# Patient Record
Sex: Female | Born: 1950 | Race: Black or African American | Hispanic: No | Marital: Single | Smoking: Never smoker
Health system: Southern US, Community
[De-identification: ages and names within clinical notes are randomized; demographics above are authoritative.]

## PROBLEM LIST (undated history)

## (undated) DIAGNOSIS — F0390 Unspecified dementia without behavioral disturbance: Secondary | ICD-10-CM

## (undated) DIAGNOSIS — C801 Malignant (primary) neoplasm, unspecified: Secondary | ICD-10-CM

---

## 2020-08-22 ENCOUNTER — Ambulatory Visit: Payer: Self-pay | Attending: Internal Medicine

## 2020-08-22 DIAGNOSIS — Z23 Encounter for immunization: Secondary | ICD-10-CM

## 2020-08-22 NOTE — Progress Notes (Signed)
   Covid-19 Vaccination Clinic  Name:  Audrey Barron    MRN: [id number] DOB: 1951/01/26  08/22/2020  Audrey Barron was observed post Covid-19 immunization for 15 minutes without incident. She was provided with Vaccine Information Sheet and instruction to access the V-Safe system.   Audrey Barron was instructed to call 911 with any severe reactions post vaccine: Marland Kitchen Difficulty breathing  . Swelling of face and throat  . A fast heartbeat  . A bad rash all over body  . Dizziness and weakness   Immunizations Administered    Name Date Dose VIS Date Route   Pfizer COVID-19 Vaccine 08/22/2020 11:38 AM 0.3 mL 02/17/2019 Intramuscular   Manufacturer: [employer], [employer]   Lot: [id number]   [place]: [id number]

## 2020-09-12 ENCOUNTER — Ambulatory Visit: Payer: Self-pay

## 2021-04-17 ENCOUNTER — Other Ambulatory Visit: Payer: Self-pay | Admitting: Physician Assistant

## 2021-04-17 ENCOUNTER — Other Ambulatory Visit: Payer: Self-pay | Admitting: Student

## 2021-04-17 DIAGNOSIS — R413 Other amnesia: Secondary | ICD-10-CM

## 2021-04-26 ENCOUNTER — Other Ambulatory Visit: Payer: Self-pay

## 2021-04-26 ENCOUNTER — Emergency Department: Payer: Medicare HMO

## 2021-04-26 ENCOUNTER — Inpatient Hospital Stay
Admission: EM | Admit: 2021-04-26 | Discharge: 2021-05-02 | DRG: 025 | Disposition: A | Discharge status: Home Health | Payer: Medicare HMO | Attending: Internal Medicine | Admitting: Internal Medicine

## 2021-04-26 DIAGNOSIS — E878 Other disorders of electrolyte and fluid balance, not elsewhere classified: Secondary | ICD-10-CM | POA: Diagnosis present

## 2021-04-26 DIAGNOSIS — C8589 Other specified types of non-Hodgkin lymphoma, extranodal and solid organ sites: Secondary | ICD-10-CM

## 2021-04-26 DIAGNOSIS — E222 Syndrome of inappropriate secretion of antidiuretic hormone: Secondary | ICD-10-CM | POA: Diagnosis present

## 2021-04-26 DIAGNOSIS — F05 Delirium due to known physiological condition: Secondary | ICD-10-CM

## 2021-04-26 DIAGNOSIS — R296 Repeated falls: Secondary | ICD-10-CM | POA: Diagnosis present

## 2021-04-26 DIAGNOSIS — Z79899 Other long term (current) drug therapy: Secondary | ICD-10-CM

## 2021-04-26 DIAGNOSIS — Z8543 Personal history of malignant neoplasm of ovary: Secondary | ICD-10-CM

## 2021-04-26 DIAGNOSIS — I700 Atherosclerosis of aorta: Secondary | ICD-10-CM | POA: Diagnosis present

## 2021-04-26 DIAGNOSIS — G9340 Encephalopathy, unspecified: Secondary | ICD-10-CM | POA: Diagnosis present

## 2021-04-26 DIAGNOSIS — Z4659 Encounter for fitting and adjustment of other gastrointestinal appliance and device: Secondary | ICD-10-CM

## 2021-04-26 DIAGNOSIS — G9389 Other specified disorders of brain: Secondary | ICD-10-CM

## 2021-04-26 DIAGNOSIS — R900 Intracranial space-occupying lesion found on diagnostic imaging of central nervous system: Secondary | ICD-10-CM

## 2021-04-26 DIAGNOSIS — E876 Hypokalemia: Secondary | ICD-10-CM | POA: Diagnosis present

## 2021-04-26 DIAGNOSIS — D496 Neoplasm of unspecified behavior of brain: Principal | ICD-10-CM | POA: Diagnosis present

## 2021-04-26 DIAGNOSIS — W19XXXA Unspecified fall, initial encounter: Secondary | ICD-10-CM | POA: Diagnosis present

## 2021-04-26 DIAGNOSIS — Z20822 Contact with and (suspected) exposure to covid-19: Secondary | ICD-10-CM | POA: Diagnosis present

## 2021-04-26 DIAGNOSIS — R479 Unspecified speech disturbances: Secondary | ICD-10-CM | POA: Diagnosis not present

## 2021-04-26 DIAGNOSIS — G936 Cerebral edema: Secondary | ICD-10-CM | POA: Diagnosis present

## 2021-04-26 DIAGNOSIS — E871 Hypo-osmolality and hyponatremia: Secondary | ICD-10-CM

## 2021-04-26 LAB — CBC
HCT: 41 % (ref 36.0–46.0)
Hemoglobin: 13.7 g/dL (ref 12.0–15.0)
MCH: 28.7 pg (ref 26.0–34.0)
MCHC: 33.4 g/dL (ref 30.0–36.0)
MCV: 85.8 fL (ref 80.0–100.0)
Platelets: 201 10*3/uL (ref 150–400)
RBC: 4.78 MIL/uL (ref 3.87–5.11)
RDW: 12 % (ref 11.5–15.5)
WBC: 7.7 10*3/uL (ref 4.0–10.5)
nRBC: 0 % (ref 0.0–0.2)

## 2021-04-26 LAB — BASIC METABOLIC PANEL
Anion gap: 7 (ref 5–15)
BUN: 9 mg/dL (ref 8–23)
CO2: 27 mmol/L (ref 22–32)
Calcium: 9.2 mg/dL (ref 8.9–10.3)
Chloride: 97 mmol/L — ABNORMAL LOW (ref 98–111)
Creatinine, Ser: 0.62 mg/dL (ref 0.44–1.00)
GFR, Estimated: >60 mL/min (ref >60)
Glucose, Bld: 102 mg/dL — ABNORMAL HIGH (ref 70–99)
Potassium: 4 mmol/L (ref 3.5–5.1)
Sodium: 131 mmol/L — ABNORMAL LOW (ref 135–145)

## 2021-04-26 LAB — RESP PANEL BY RT-PCR (FLU A&B, COVID) ARPGX2
Influenza A by PCR: NEGATIVE
Influenza B by PCR: NEGATIVE
SARS Coronavirus 2 by RT PCR: NEGATIVE

## 2021-04-26 IMAGING — CT CT HEAD W/O CM
3 series · 15 of 47 positions shown, 18 images · non-contrast
Comparison: None.

CLINICAL DATA: Disoriented, multiple falls, facial droop and
weakness from previous stroke

EXAM:
CT HEAD WITHOUT CONTRAST
TECHNIQUE: Contiguous axial images were obtained from the base of the skull
through the vertex without intravenous contrast.

[Series 3: head wo · axial · 0.40mm/px · z∈[-216,-86]mm · 9 of 32 slices shown, 12 images]
[im 3/32  brain]
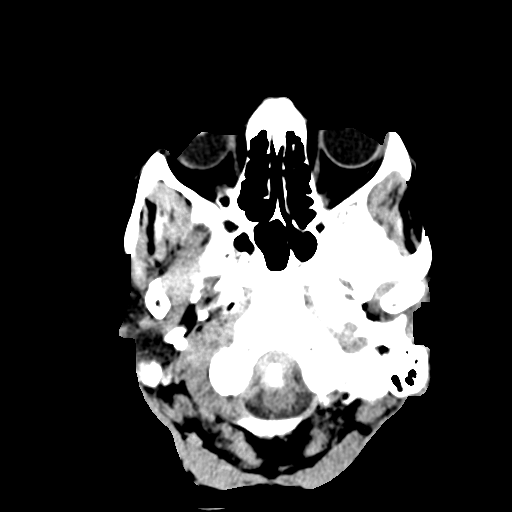
[im 3/32  bone]
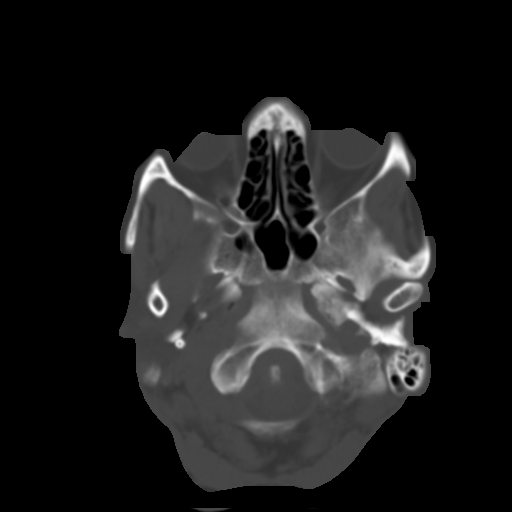
[im 6/32  brain]
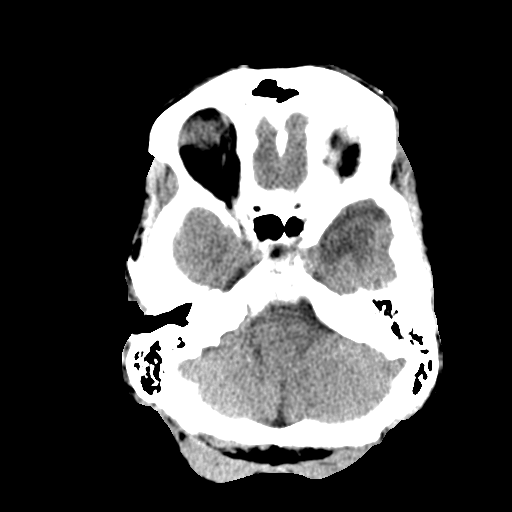
[im 9/32  brain]
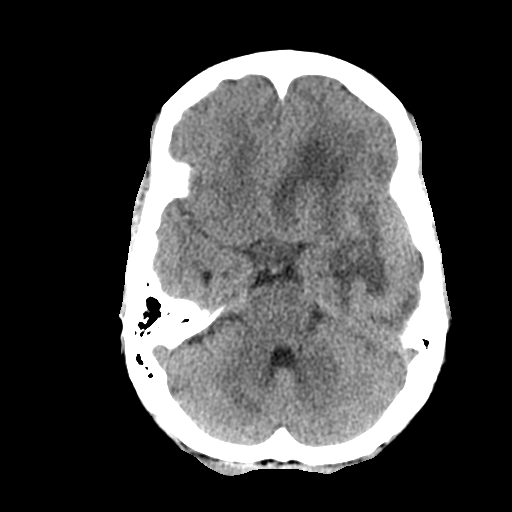
[im 12/32  brain]
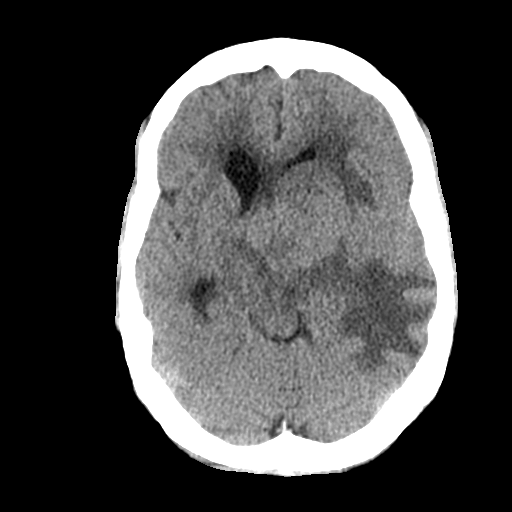
[im 17/32  brain]
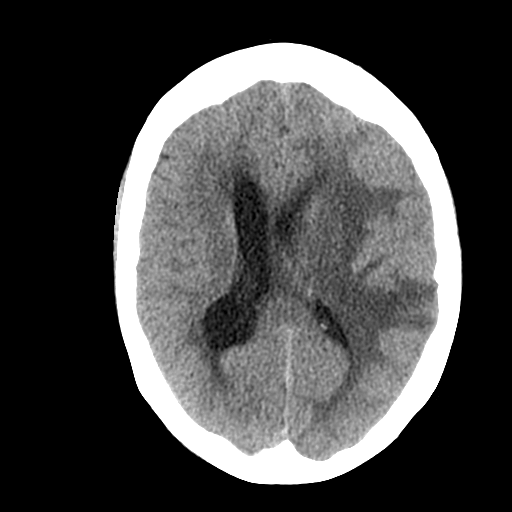
[im 17/32  bone]
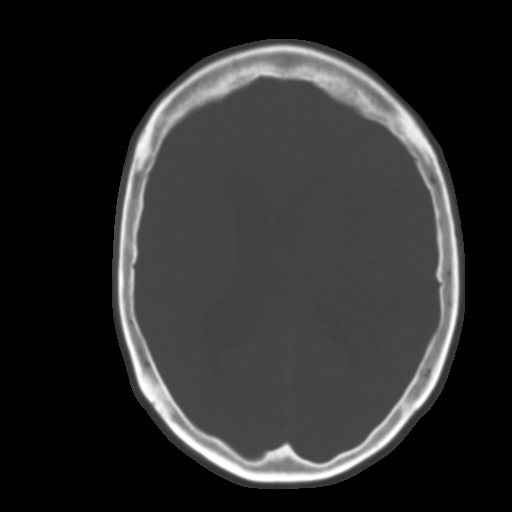
[im 20/32  brain]
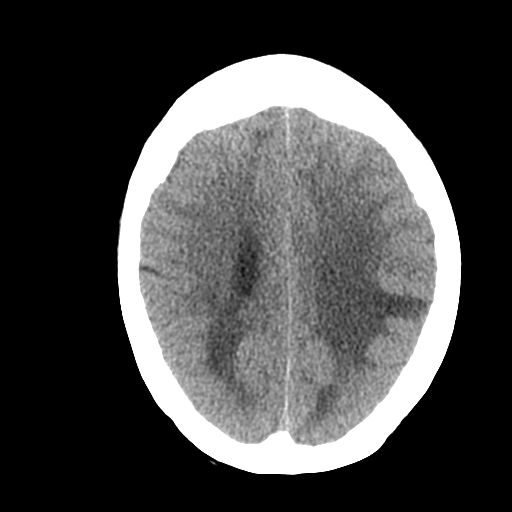
[im 23/32  brain]
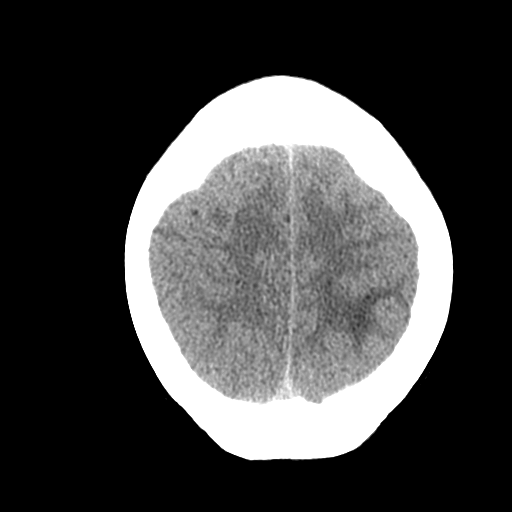
[im 26/32  brain]
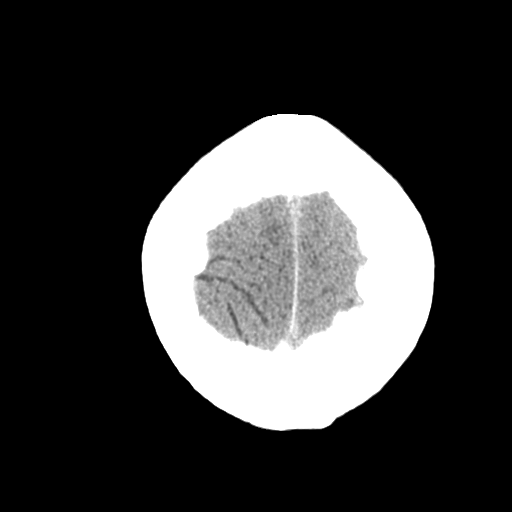
[im 29/32  brain]
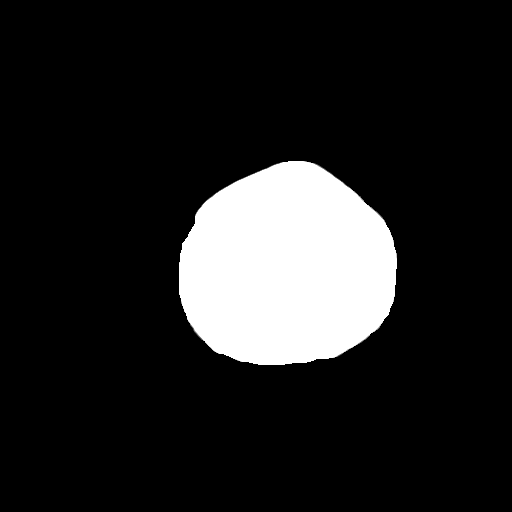
[im 29/32  bone]
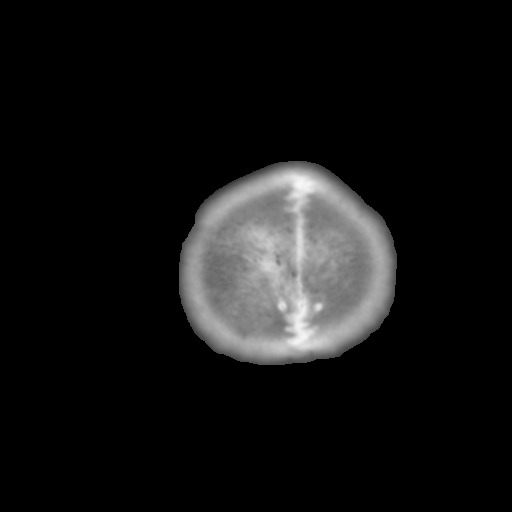

[Series 4: coronal soft tissue · coronal · 0.32mm/px · 3 of 67 slices shown]
[im 23/67  brain]
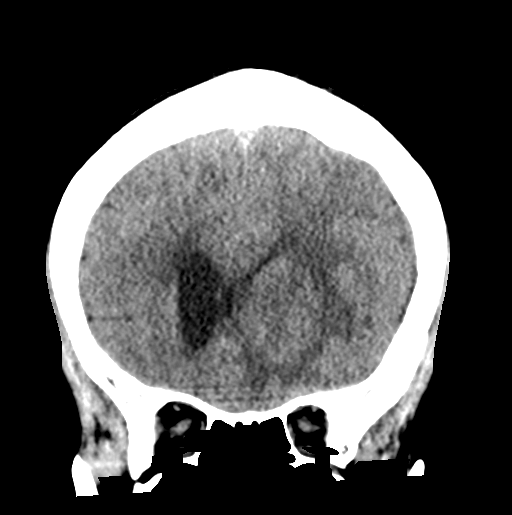
[im 30/67  brain]
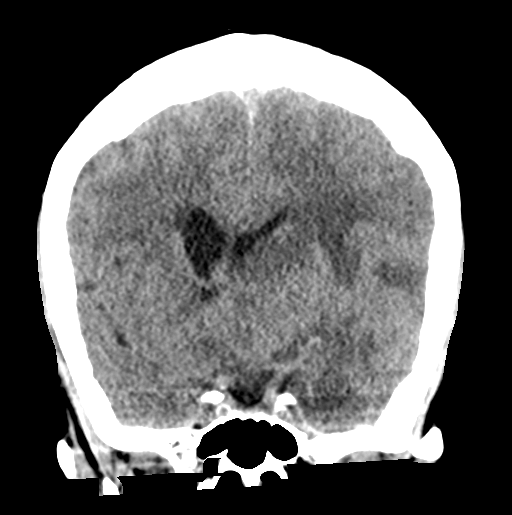
[im 37/67  brain]
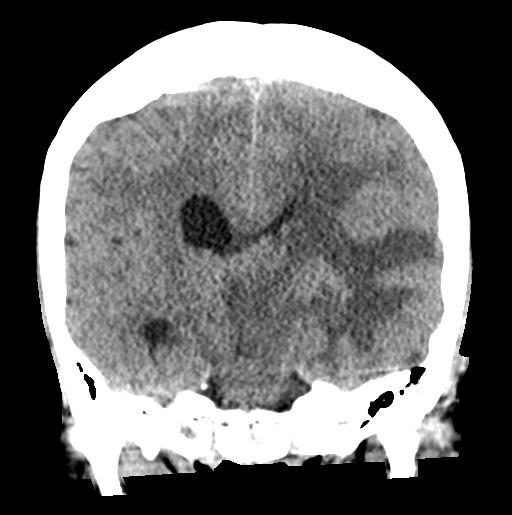

[Series 5: sagittal soft tissue · sagittal · 0.31mm/px · 3 of 54 slices shown]
[im 18/54  brain]
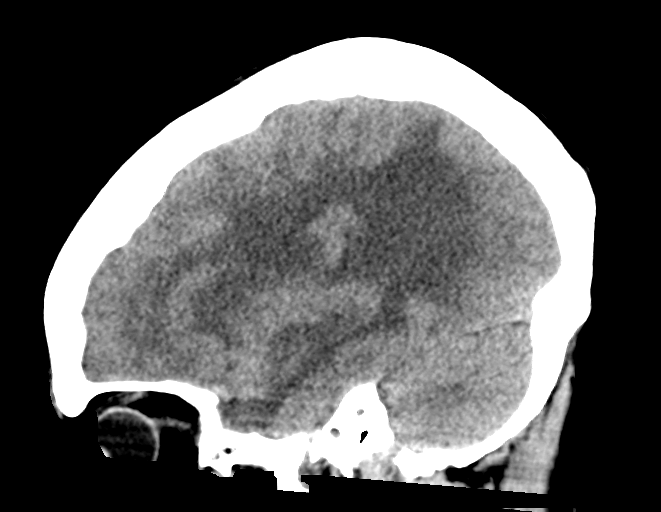
[im 27/54  brain]
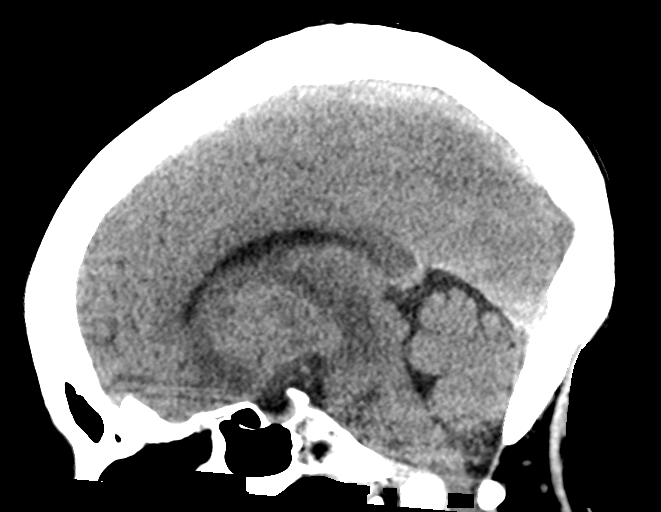
[im 36/54  brain]
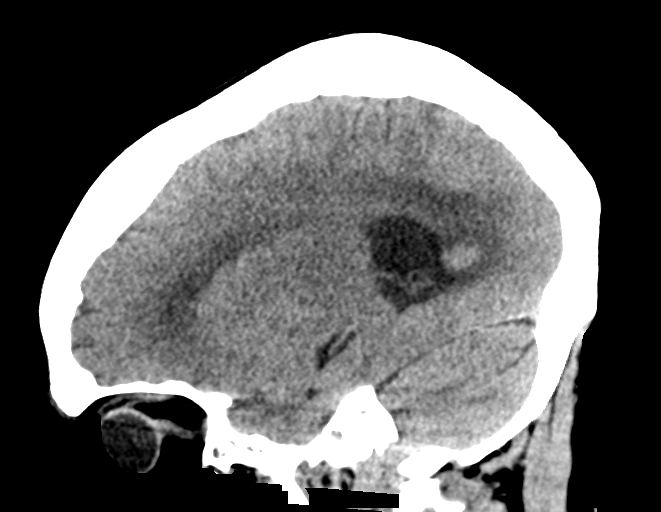

[15 of 47 positions shown; findings below may reference images not displayed]

FINDINGS: Brain: There is a large mass centered in the region of the left
basal ganglia, measuring approximately 4.3 x 3.5 cm. Marked
surrounding vasogenic edema throughout the left cerebral hemisphere,
with marked mass effect and rightward midline shift measuring
approximately 14 mm at the level of the septum pellucidum. There is
effacement the basilar cisterns. MRI is recommended for further
evaluation.

No acute hemorrhage. Marked effacement of the lateral ventricles by
the mass effect described above. No acute extra-axial fluid
collections.

Vascular: No hyperdense vessel or unexpected calcification.

Skull: Normal. Negative for fracture or focal lesion.

Sinuses/Orbits: No acute finding.

Other: None.
IMPRESSION: 1. Large heterogeneous mass centered in the region of the left basal
ganglia, with significant surrounding vasogenic edema and mass
effect. MRI is recommended for further evaluation.
2. No acute infarct or hemorrhage.

Critical Value/emergent results were called by telephone at the time
of interpretation on [DATE] at [DATE] to provider JUMPER
JUMPER , who verbally acknowledged these results.

## 2021-04-26 IMAGING — MR MR HEAD WO/W CM
14 series · 48 of 48 positions shown · IV contrast (5ml Gadavist)
Comparison: Head CT same day

CLINICAL DATA: Brain mass

EXAM:
MRI HEAD WITHOUT AND WITH CONTRAST
TECHNIQUE: Multiplanar, multiecho pulse sequences of the brain and surrounding
structures were obtained without and with intravenous contrast.
CONTRAST:  5mL GADAVIST GADOBUTROL 1 MMOL/ML IV SOLN

[Series 5: ax dwi_tracew · axial · 3.0mm · 0.65mm/px · z∈[-137,+15]mm · 3 of 48 slices shown]
[im 1/48]
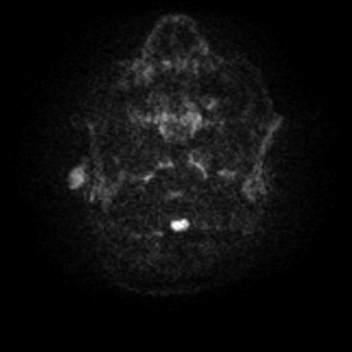
[im 24/48]
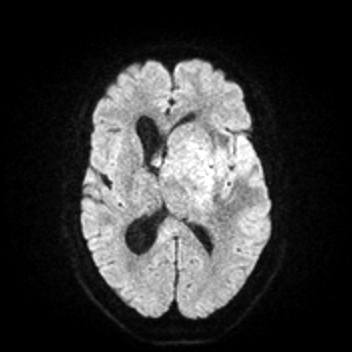
[im 48/48]
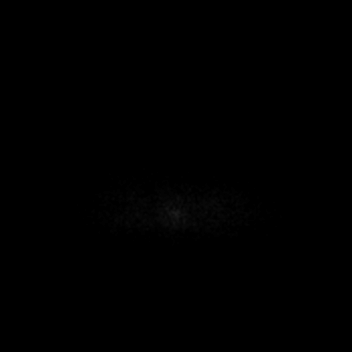

[Series 6: ax dwi_adc · axial · 3.0mm · 0.65mm/px · z∈[-137,+11]mm · 3 of 47 slices shown]
[im 1/47]
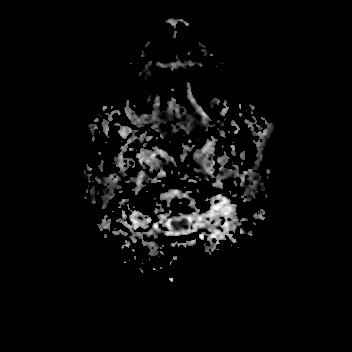
[im 24/47]
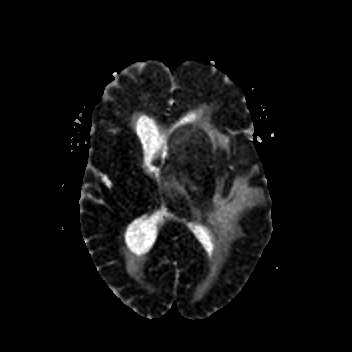
[im 47/47]
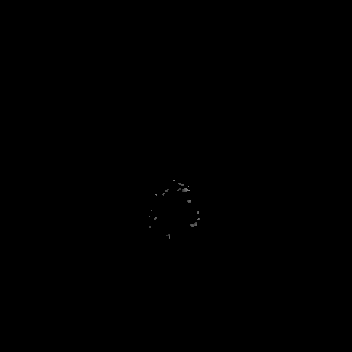

[Series 7: cor dwi_tracew · coronal · 5.0mm · 0.65mm/px · 2 of 40 slices shown]
[im 1/40]
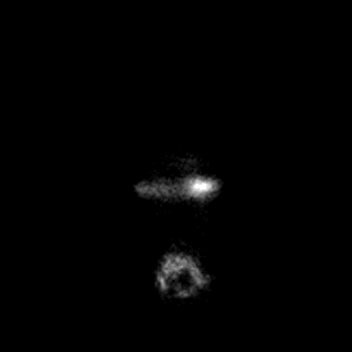
[im 40/40]
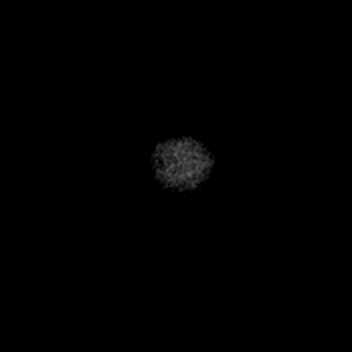

[Series 8: cor dwi_adc · coronal · 5.0mm · 0.65mm/px · 2 of 33 slices shown]
[im 1/33]
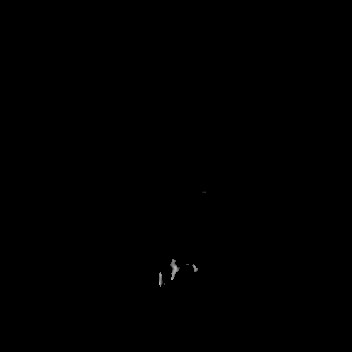
[im 33/33]
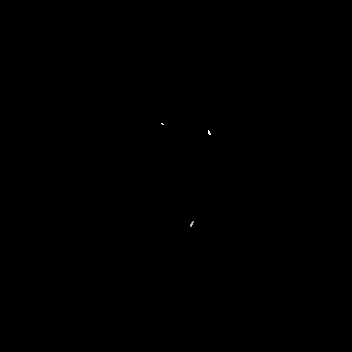

[Series 9: T1 · sagittal · 5.0mm · 0.62mm/px · 1 of 23 slices shown (1 of 2)]
[im 1/23]
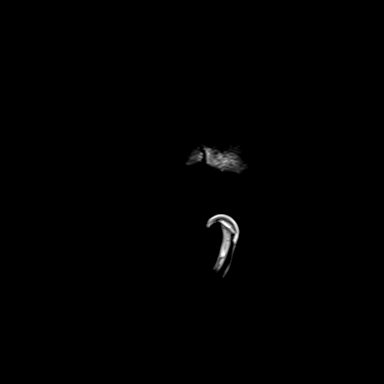

[Series 10: T2 · axial · 5.0mm · 0.53mm/px · z∈[-137,+16]mm · 2 of 27 slices shown (1 of 2)]
[im 1/27]
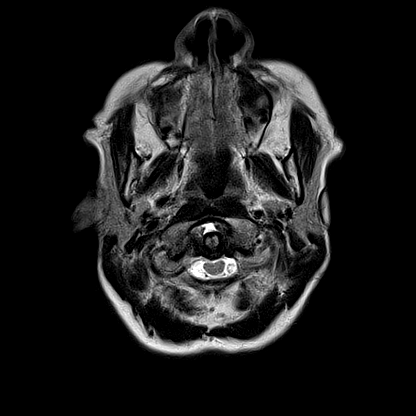
[im 27/27]
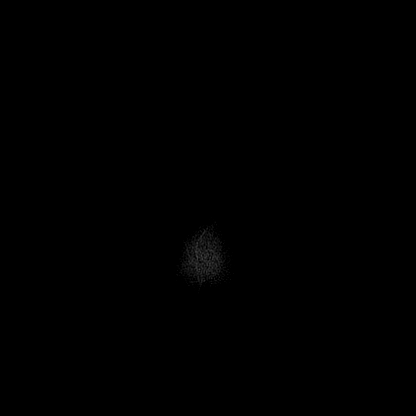

[Series 12: pha_images · axial · 3.0mm · 0.90mm/px · z∈[-145,+23]mm · 3 of 56 slices shown]
[im 1/56]
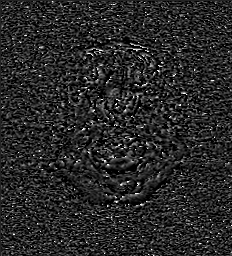
[im 28/56]
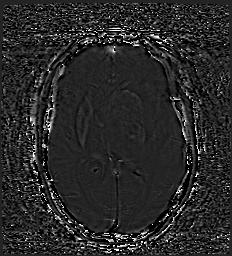
[im 56/56]
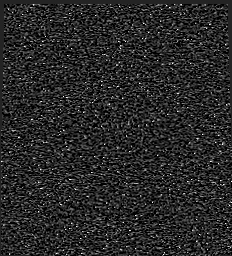

[Series 13: swi_images · axial · 3.0mm · 0.90mm/px · z∈[-148,+26]mm · 4 of 59 slices shown]
[im 1/59]
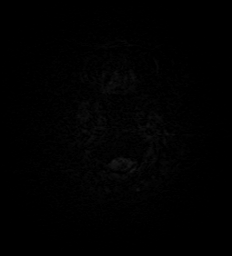
[im 20/59]
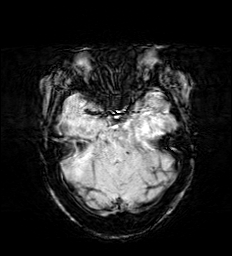
[im 39/59]
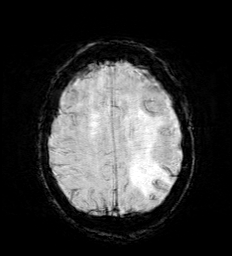
[im 59/59]
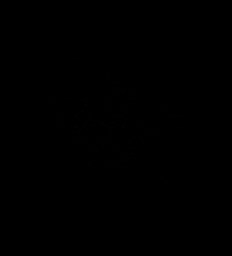

[Series 15: FLAIR · axial · 3.0mm · 0.53mm/px · z∈[-139,+19]mm · 3 of 55 slices shown]
[im 1/55]
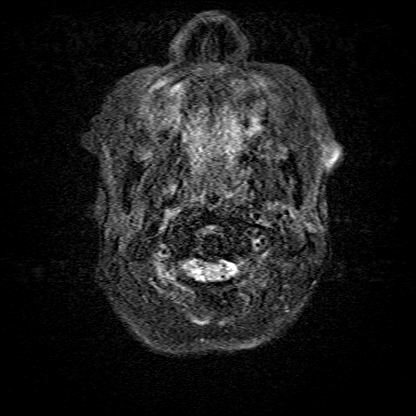
[im 28/55]
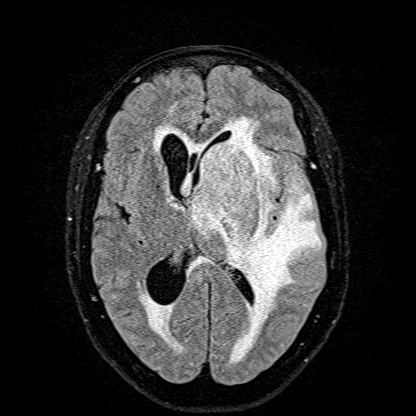
[im 55/55]
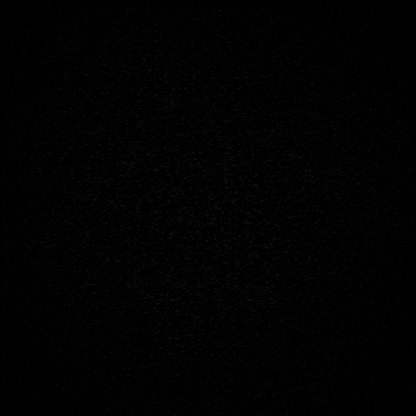

[Series 16: T1 · axial · 1.0mm · 0.98mm/px · z∈[-141,+15]mm · 10 of 160 slices shown (2 of 2)]
[im 1/160]
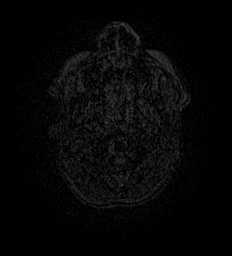
[im 18/160]
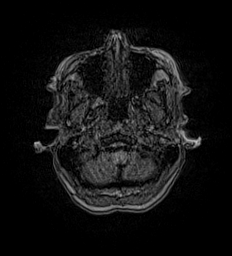
[im 36/160]
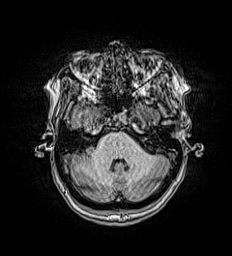
[im 54/160]
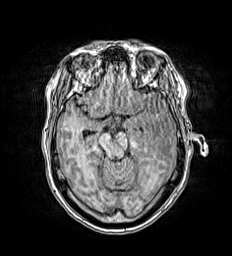
[im 71/160]
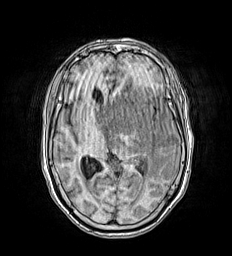
[im 89/160]
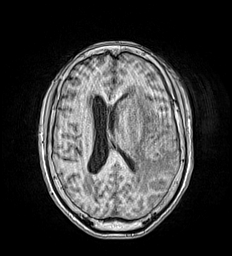
[im 107/160]
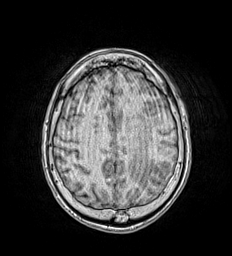
[im 124/160]
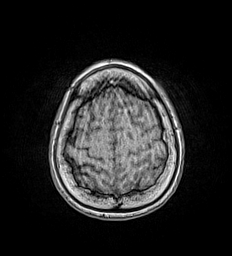
[im 142/160]
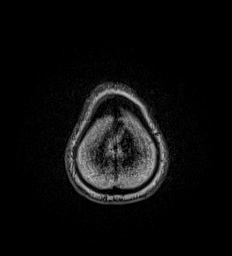
[im 160/160]
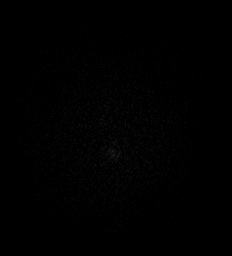

[Series 17: T2 · coronal · 5.0mm · 0.57mm/px · 2 of 29 slices shown (2 of 2)]
[im 1/29]
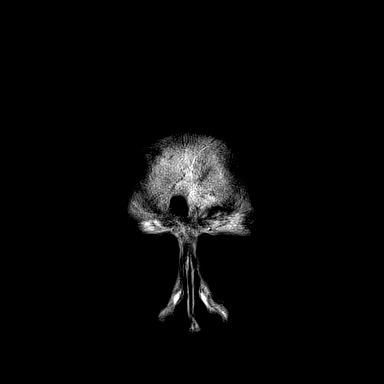
[im 29/29]
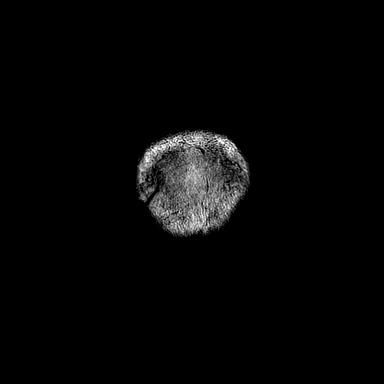

[Series 22: T1 post-contrast · axial · 1.0mm · 0.98mm/px · z∈[-142,+15]mm · 10 of 160 slices shown (1 of 3)]
[im 1/160]
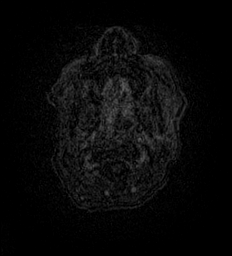
[im 18/160]
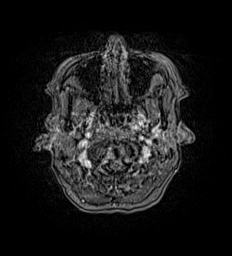
[im 36/160]
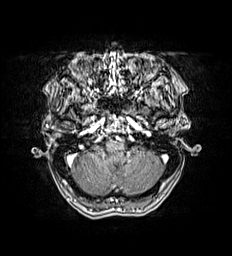
[im 54/160]
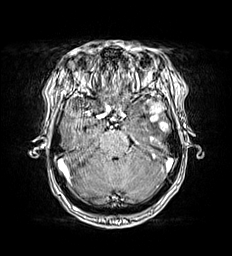
[im 71/160]
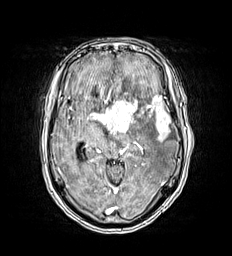
[im 89/160]
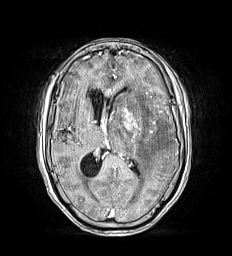
[im 107/160]
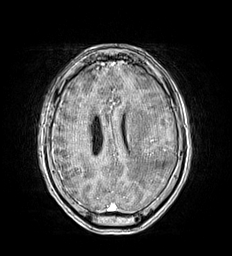
[im 124/160]
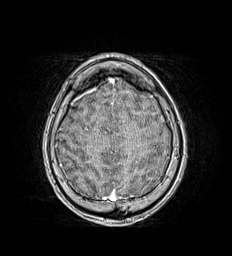
[im 142/160]
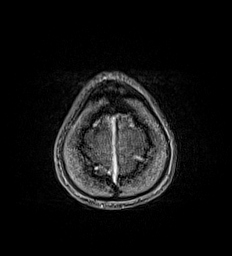
[im 160/160]
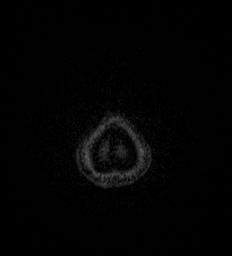

[Series 23: T1 post-contrast · coronal · 5.0mm · 0.57mm/px · 2 of 29 slices shown (2 of 3)]
[im 1/29]
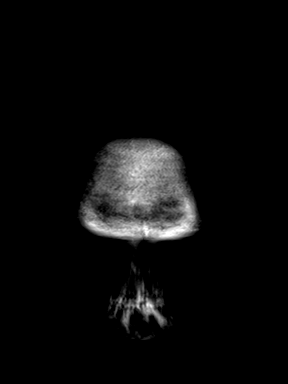
[im 29/29]
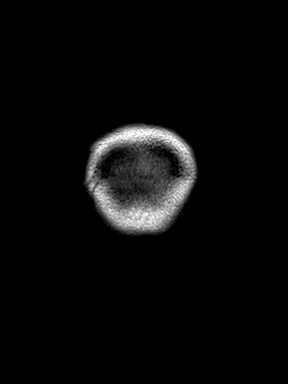

[Series 24: T1 post-contrast · sagittal · 5.0mm · 0.62mm/px · 1 of 23 slices shown (3 of 3)]
[im 1/23]
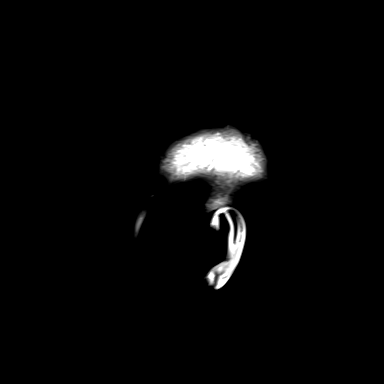

[48 of 48 positions shown; findings below may reference images not displayed]

FINDINGS: Brain: There is multifocal abnormal contrast enhancement centered
within the left basal ganglia and extending into the left temporal
lobe and crossing the midline at the level of the fornix. There is a
large amount of surrounding hyperintense T2-weighted signal. At the
level of the foramina of [place], there is rightward midline shift
measuring 12 mm. The contrast enhancement pattern is predominantly
solid.

Vascular: Major flow voids are preserved.

Skull and upper cervical spine: Normal calvarium and skull base.
Visualized upper cervical spine and soft tissues are normal.

Sinuses/Orbits:No paranasal sinus fluid levels or advanced mucosal
thickening. No mastoid or middle ear effusion. Normal orbits.
IMPRESSION: 1. Multifocal abnormal contrast enhancement centered within the left
basal ganglia and extending into the left temporal lobe, crossing
the midline at the level of the fornix. This is favored to represent
lymphoma. The lack of necrosis is not characteristic of
glioblastoma, which remains the second consideration.
2. Rightward midline shift measuring 12 mm.

## 2021-04-26 MED ORDER — GADOBUTROL 1 MMOL/ML IV SOLN
5.0000 mL | Freq: Once | INTRAVENOUS | Status: AC | PRN
  Administered 2021-04-27: 5 mL via INTRAVENOUS

## 2021-04-26 NOTE — ED Provider Notes (Signed)
[facility] Emergency Department Provider Note  Time seen: 10:04 PM  I have reviewed the triage vital signs and the nursing notes.   HISTORY  Chief Complaint Fall   HPI Audrey Barron is a 70 y.o. female presents to the emergency department for falls, feeling off balance and difficulty speaking.  According to the daughter the patient moved in with her yesterday, but she states since January the patient has been having occasional falls and had difficulty speaking.  States the patient's boyfriend told her that he thinks she had a stroke but did not follow-up with any medical professional.  Daughter states the patient had improved and was doing well until approximately 1 week ago when the patient began having significant speech difficulties once again as well as frequent falls.  Patient had a fall today so the daughter brought her to the emergency department for evaluation.  Here the patient is awake and alert but she has significant difficulty getting words out, appears to have a right facial droop as well as right arm weakness on exam.  No past medical history on file.  There are no problems to display for this patient.   Prior to Admission medications   Not on File    Not on File  No family history on file.  Social History    Review of Systems Per patient and daughter Constitutional: Negative for fever. Cardiovascular: Negative for chest pain. Respiratory: Negative for shortness of breath. Gastrointestinal: Negative for abdominal pain Musculoskeletal: Negative for musculoskeletal complaints Neurological: Negative for headache.  Right facial droop.  Right arm weakness.  Difficulty with speech. All other ROS negative  ____________________________________________   PHYSICAL EXAM:  VITAL SIGNS: ED Triage Vitals  Enc Vitals Group     BP 04/26/21 1748 129/89     Pulse Rate 04/26/21 1748 71     Resp 04/26/21 1748 20     Temp 04/26/21 1748 99.5 F  (37.5 C)     Temp Source 04/26/21 1748 Oral     SpO2 04/26/21 1748 98 %     Weight 04/26/21 1752 130 lb (59 kg)     Height 04/26/21 1752 5\' 5"  (1.651 m)     Head Circumference --      Peak Flow --      Pain Score --      Pain Loc --      Pain Edu? --      Excl. in [place]? --    Constitutional: Alert and oriented. Well appearing and in no distress. Eyes: Normal exam ENT      Head: Normocephalic and atraumatic.      Mouth/Throat: Mucous membranes are moist. Cardiovascular: Normal rate, regular rhythm.  Respiratory: Normal respiratory effort without tachypnea nor retractions. Breath sounds are clear  Gastrointestinal: Soft and nontender. No distention.   Musculoskeletal: Nontender with normal range of motion in all extremities.  Neurologic: Patient has fairly significant speech difficulties.  Unable to articulate the words she is trying to say.  Patient also has a significant right facial droop as well and has a right pronator drift on exam. Skin:  Skin is warm, dry and intact.  Psychiatric: Mood and affect are normal.  ____________________________________________    EKG  EKG viewed and interpreted by myself shows normal sinus rhythm at 71 bpm with a narrow QRS, normal axis, normal intervals, no concerning ST changes.  ____________________________________________    RADIOLOGY  CT scan shows a large mass with vasogenic edema mass-effect and midline  shift.  ____________________________________________   INITIAL IMPRESSION / ASSESSMENT AND PLAN / ED COURSE  Pertinent labs & imaging results that were available during my care of the patient were reviewed by me and considered in my medical decision making (see chart for details).   Patient presents emergency department for difficulty speaking as well as increased falls recently.  Daughter states this has been going on since January but acutely worse over the past 1 week.  On examination patient has a significant right facial droop  she has a right pronator drift with difficulty with extension as well as significant difficulty with word finding as well as articulation.  Suspect likely CVA versus mass.  We will obtain CT imaging.  Patient's lab work is largely within normal limits, urinalysis pending.  CT unfortunately consistent with a large brain mass.  Spoke to Dr. Cari Caraway we will obtain an MRI with and without contrast to further evaluate.  I did update the patient as well as daughter regarding the findings and plan for further work-up.  Blood work largely [place].  COVID-negative.  Patient care signed out to oncoming provider.  MRI pending.   NIH Stroke Scale   Interval: Baseline Time: 10:07 PM Person Administering Scale: Harvest Dark  Administer stroke scale items in the order listed. Record performance in each category after each subscale exam. Do not go back and change scores. Follow directions provided for each exam technique. Scores should reflect what the patient does, not what the clinician thinks the patient can do. The clinician should record answers while administering the exam and work quickly. Except where indicated, the patient should not be coached (i.e., repeated requests to patient to make a special effort).   1a  Level of consciousness: 0=alert; keenly responsive  1b. LOC questions:  0=Performs both tasks correctly  1c. LOC commands: 0=Performs both tasks correctly  2.  Best Gaze: 0=normal  3.  Visual: 0=No visual loss  4. Facial Palsy: 2=Partial paralysis (total or near total paralysis of the lower face)  5a.  Motor left arm: 0=No drift, limb holds 90 (or 45) degrees for full 10 seconds  5b.  Motor right arm: 1=Drift, limb holds 90 (or 45) degrees but drifts down before full 10 seconds: does not hit bed  6a. motor left leg: 0=No drift, limb holds 90 (or 45) degrees for full 10 seconds  6b  Motor right leg:  0=No drift, limb holds 90 (or 45) degrees for full 10 seconds  7. Limb  Ataxia: 1=Present in one limb  8.  Sensory: 0=Normal; no sensory loss  9. Best Language:  1=Mild to moderate aphasia; some obvious loss of fluency or facility of comprehension without significant limitation on ideas expressed or form of expression.  10. Dysarthria: 1=Mild to moderate, patient slurs at least some words and at worst, can be understood with some difficulty  11. Extinction and Inattention: 0=No abnormality  12. Distal motor function: 1=At least some extension after 5 seconds, but is not fully extended. Any movement of the fingers which is not in response to a command is not scored   Total:   7    Audrey Barron was evaluated in Emergency Department on 04/26/2021 for the symptoms described in the history of present illness. She was evaluated in the context of the global COVID-19 pandemic, which necessitated consideration that the patient might be at risk for infection with the SARS-CoV-2 virus that causes COVID-19. Institutional protocols and algorithms that pertain to the evaluation of patients at  risk for COVID-19 are in a state of rapid change based on information released by regulatory bodies including the CDC and federal and state organizations. These policies and algorithms were followed during the patient's care in the ED.  ____________________________________________   FINAL CLINICAL IMPRESSION(S) / ED DIAGNOSES  Brain mass   Harvest Dark, MD 04/26/21 2323

## 2021-04-26 NOTE — ED Triage Notes (Signed)
Pt to ED [facility] from home, lives with family. Ems reports has been having more recent falls. Pt disoriented, this is normal per ems. Hx stroke, per ems pt has facial droop and weakness from last stroke.  Pt denies complaints at this time. No obvious deformities, bruises or lacerations from recent falls.  Difficult to assess pt, when asked pt to smile and lift arms, pt asked RN "what are you smoking", pt appears to have full range of motion in all extremities, intermittently not following commands, staring at [employer]

## 2021-04-26 NOTE — ED Notes (Signed)
Patient to MRI.

## 2021-04-26 NOTE — ED Notes (Signed)
Pt to ct scan.

## 2021-04-26 NOTE — ED Notes (Signed)
Family reports recent falls.  Pt was found outside today in the yard with no pants on.  Pt c/o right hip pain earlier today.  Denies pain now.  Hx dementia.  No rotation or shortening noted.  Denies h/a  Pt alert.

## 2021-04-27 ENCOUNTER — Encounter: Payer: Self-pay | Admitting: Family Medicine

## 2021-04-27 ENCOUNTER — Ambulatory Visit: Payer: Medicare HMO

## 2021-04-27 ENCOUNTER — Other Ambulatory Visit: Payer: Self-pay

## 2021-04-27 ENCOUNTER — Inpatient Hospital Stay: Payer: Medicare HMO

## 2021-04-27 DIAGNOSIS — W19XXXA Unspecified fall, initial encounter: Secondary | ICD-10-CM

## 2021-04-27 DIAGNOSIS — R900 Intracranial space-occupying lesion found on diagnostic imaging of central nervous system: Secondary | ICD-10-CM | POA: Diagnosis present

## 2021-04-27 DIAGNOSIS — G936 Cerebral edema: Secondary | ICD-10-CM | POA: Diagnosis present

## 2021-04-27 DIAGNOSIS — E876 Hypokalemia: Secondary | ICD-10-CM | POA: Diagnosis present

## 2021-04-27 DIAGNOSIS — C8589 Other specified types of non-Hodgkin lymphoma, extranodal and solid organ sites: Secondary | ICD-10-CM

## 2021-04-27 DIAGNOSIS — Z8543 Personal history of malignant neoplasm of ovary: Secondary | ICD-10-CM | POA: Diagnosis not present

## 2021-04-27 DIAGNOSIS — E871 Hypo-osmolality and hyponatremia: Secondary | ICD-10-CM

## 2021-04-27 DIAGNOSIS — I700 Atherosclerosis of aorta: Secondary | ICD-10-CM | POA: Diagnosis present

## 2021-04-27 DIAGNOSIS — D496 Neoplasm of unspecified behavior of brain: Secondary | ICD-10-CM | POA: Diagnosis present

## 2021-04-27 DIAGNOSIS — G9389 Other specified disorders of brain: Principal | ICD-10-CM

## 2021-04-27 DIAGNOSIS — R410 Disorientation, unspecified: Secondary | ICD-10-CM

## 2021-04-27 DIAGNOSIS — R2681 Unsteadiness on feet: Secondary | ICD-10-CM

## 2021-04-27 DIAGNOSIS — R479 Unspecified speech disturbances: Secondary | ICD-10-CM | POA: Diagnosis present

## 2021-04-27 DIAGNOSIS — F05 Delirium due to known physiological condition: Secondary | ICD-10-CM | POA: Diagnosis present

## 2021-04-27 DIAGNOSIS — E878 Other disorders of electrolyte and fluid balance, not elsewhere classified: Secondary | ICD-10-CM | POA: Diagnosis present

## 2021-04-27 DIAGNOSIS — Z20822 Contact with and (suspected) exposure to covid-19: Secondary | ICD-10-CM | POA: Diagnosis present

## 2021-04-27 DIAGNOSIS — G9340 Encephalopathy, unspecified: Secondary | ICD-10-CM | POA: Diagnosis present

## 2021-04-27 DIAGNOSIS — E222 Syndrome of inappropriate secretion of antidiuretic hormone: Secondary | ICD-10-CM | POA: Diagnosis present

## 2021-04-27 DIAGNOSIS — R296 Repeated falls: Secondary | ICD-10-CM | POA: Diagnosis present

## 2021-04-27 DIAGNOSIS — Z79899 Other long term (current) drug therapy: Secondary | ICD-10-CM | POA: Diagnosis not present

## 2021-04-27 LAB — CBC
HCT: 40 % (ref 36.0–46.0)
Hemoglobin: 13.7 g/dL (ref 12.0–15.0)
MCH: 29 pg (ref 26.0–34.0)
MCHC: 34.3 g/dL (ref 30.0–36.0)
MCV: 84.6 fL (ref 80.0–100.0)
Platelets: 194 10*3/uL (ref 150–400)
RBC: 4.73 MIL/uL (ref 3.87–5.11)
RDW: 12 % (ref 11.5–15.5)
WBC: 7.3 10*3/uL (ref 4.0–10.5)
nRBC: 0 % (ref 0.0–0.2)

## 2021-04-27 LAB — BASIC METABOLIC PANEL
Anion gap: 7 (ref 5–15)
BUN: 7 mg/dL — ABNORMAL LOW (ref 8–23)
CO2: 27 mmol/L (ref 22–32)
Calcium: 9.3 mg/dL (ref 8.9–10.3)
Chloride: 97 mmol/L — ABNORMAL LOW (ref 98–111)
Creatinine, Ser: 0.53 mg/dL (ref 0.44–1.00)
GFR, Estimated: >60 mL/min (ref >60)
Glucose, Bld: 90 mg/dL (ref 70–99)
Potassium: 3.9 mmol/L (ref 3.5–5.1)
Sodium: 131 mmol/L — ABNORMAL LOW (ref 135–145)

## 2021-04-27 LAB — URINALYSIS, COMPLETE (UACMP) WITH MICROSCOPIC
Bacteria, UA: NONE SEEN
Bilirubin Urine: NEGATIVE
Glucose, UA: NEGATIVE mg/dL
Hgb urine dipstick: NEGATIVE
Ketones, ur: 20 mg/dL — AB
Leukocytes,Ua: NEGATIVE
Nitrite: NEGATIVE
Protein, ur: NEGATIVE mg/dL
Specific Gravity, Urine: 1.021 (ref 1.005–1.030)
pH: 9 — ABNORMAL HIGH (ref 5.0–8.0)

## 2021-04-27 LAB — VITAMIN B12: Vitamin B-12: 958 pg/mL — ABNORMAL HIGH (ref 180–914)

## 2021-04-27 LAB — TSH: TSH: 0.411 u[IU]/mL (ref 0.350–4.500)

## 2021-04-27 LAB — PLATELET FUNCTION ASSAY: Collagen / Epinephrine: 131 seconds (ref 0–193)

## 2021-04-27 LAB — HIV ANTIBODY (ROUTINE TESTING W REFLEX): HIV Screen 4th Generation wRfx: NONREACTIVE

## 2021-04-27 IMAGING — CT CT CHEST-ABD-PELV W/ CM
2 of 5 series · 13 of 36 positions shown, 15 images · IV contrast (omnipaque)
Comparison: Only brain imaging is available for comparison.

CLINICAL DATA: Metastatic disease evaluation, history of possible
lymphoma in a 69-year-old female found to have abnormality on brain
imaging.

EXAM:
CT CHEST, ABDOMEN, AND PELVIS WITH CONTRAST
TECHNIQUE: Multidetector CT imaging of the chest, abdomen and pelvis was
performed following the standard protocol during bolus
administration of intravenous contrast.
CONTRAST:  75mL OMNIPAQUE IOHEXOL 300 MG/ML  SOLN

[Series 2: cap with · axial · 0.77mm/px · z∈[-982,-487]mm · 10 of 123 slices shown, 12 images]
[im 12/123  mediastinal]
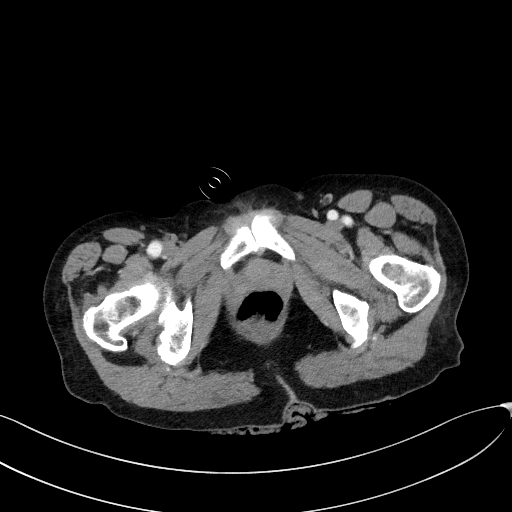
[im 12/123  bone]
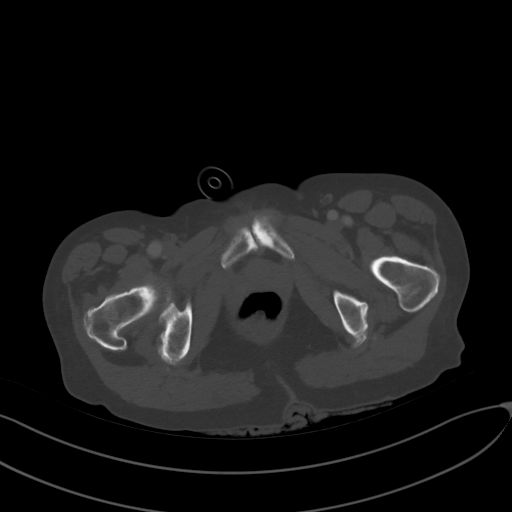
[im 23/123  mediastinal]
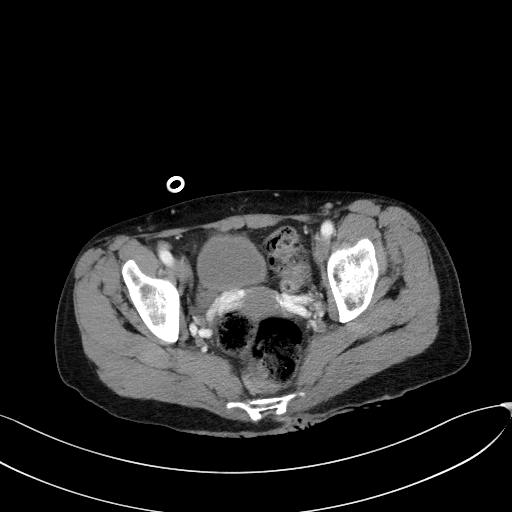
[im 34/123  mediastinal]
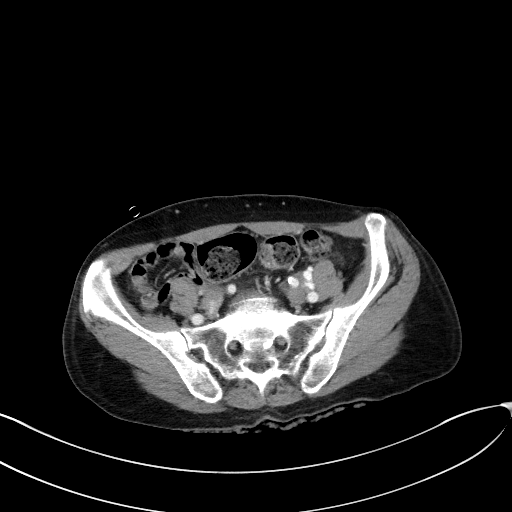
[im 45/123  mediastinal]
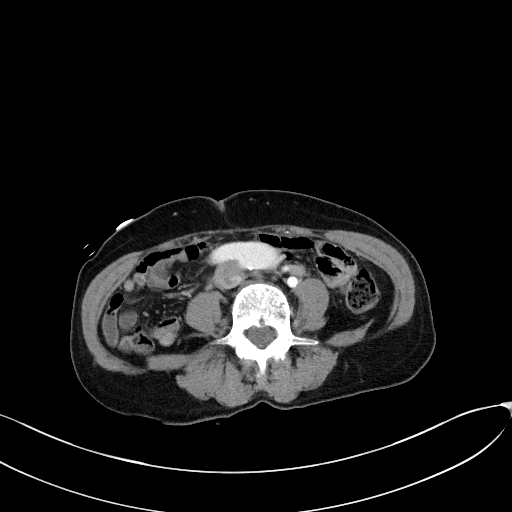
[im 56/123  mediastinal]
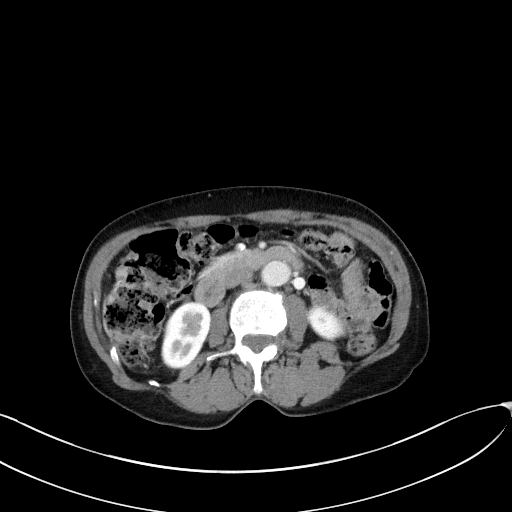
[im 67/123  mediastinal]
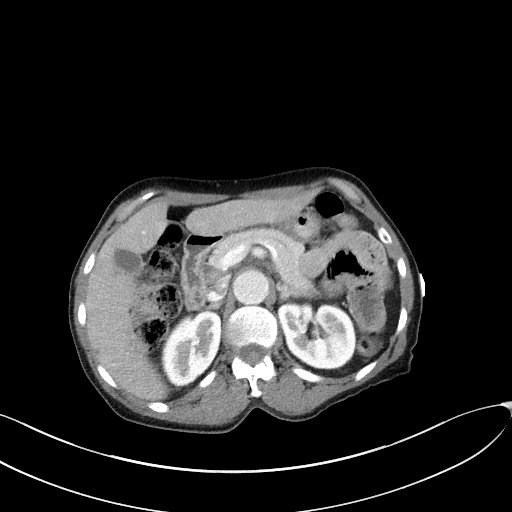
[im 78/123  mediastinal]
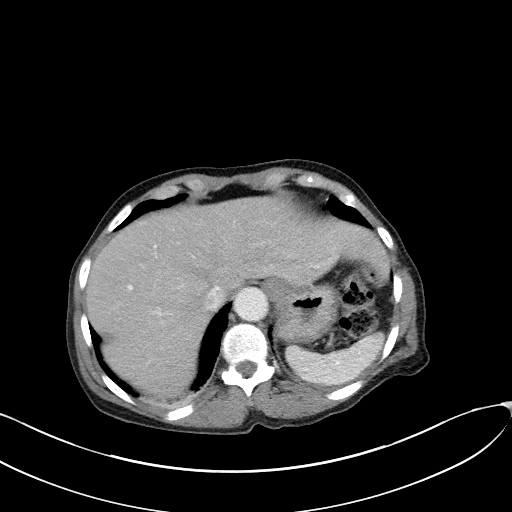
[im 89/123  mediastinal]
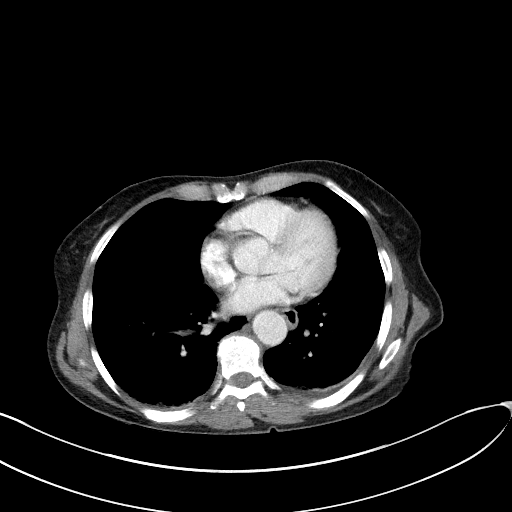
[im 100/123  mediastinal]
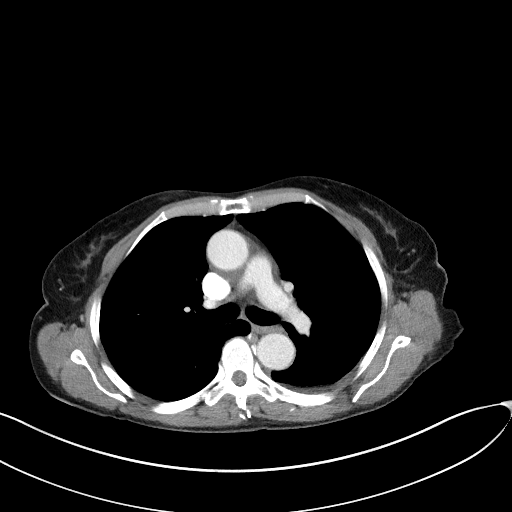
[im 100/123  bone]
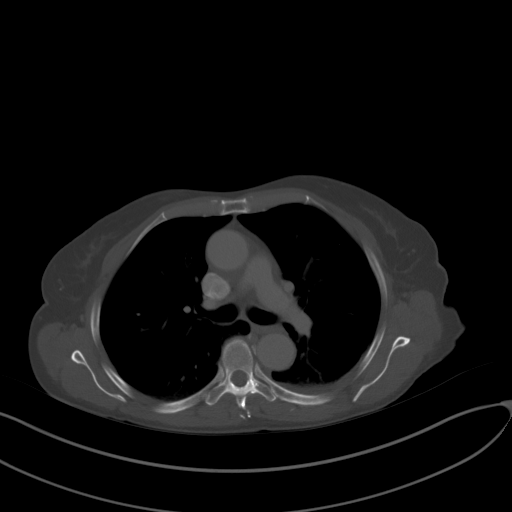
[im 111/123  mediastinal]
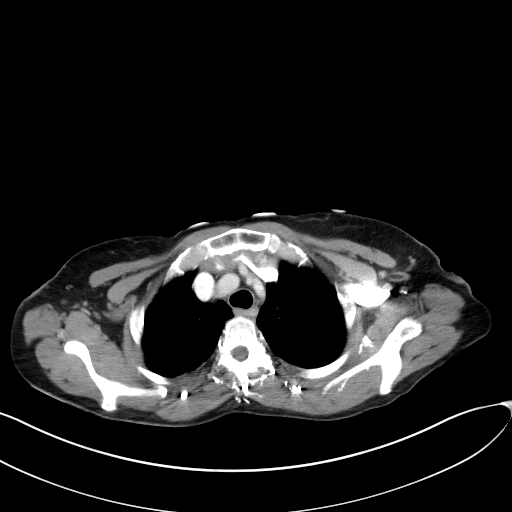

[Series 5: coronals · coronal · 0.68mm/px · 3 of 110 slices shown]
[im 22/110  mediastinal]
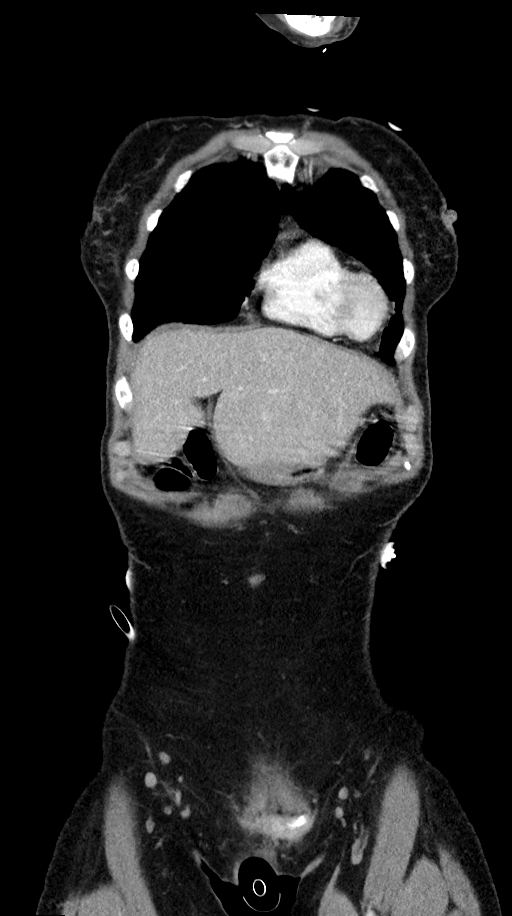
[im 44/110  mediastinal]
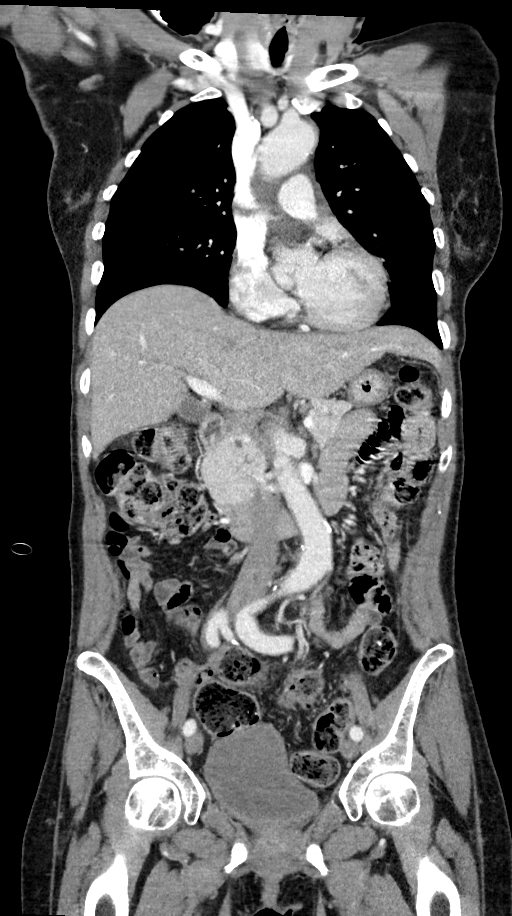
[im 66/110  mediastinal]
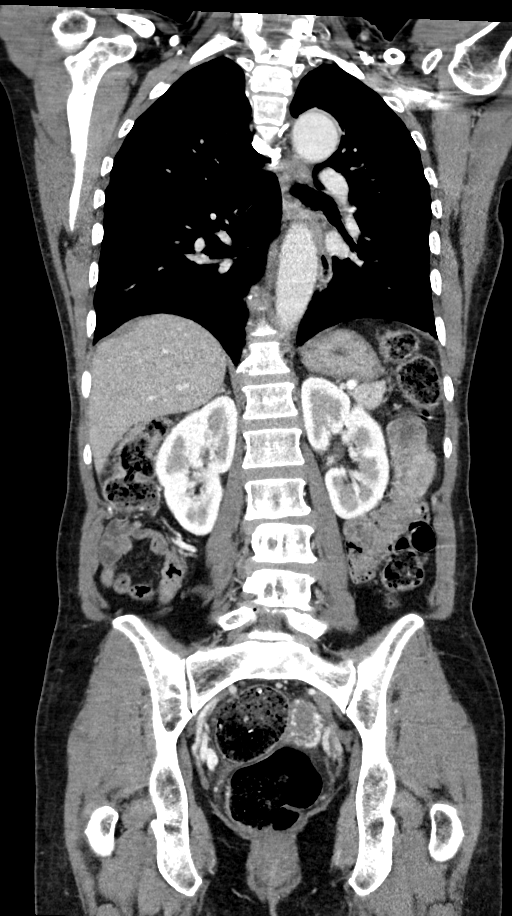

[13 of 36 positions shown; findings below may reference images not displayed]

FINDINGS: CT CHEST FINDINGS

Cardiovascular: Scattered aortic atherosclerosis. No aneurysmal
dilation of the thoracic aorta. Heart size normal without
pericardial effusion. Central pulmonary vasculature is normal
caliber.

Mediastinum/Nodes: Esophagus mildly patulous. No thoracic inlet
lymphadenopathy.

No axillary lymphadenopathy.

No hilar or mediastinal lymphadenopathy.

Lungs/Pleura: No consolidation or pleural effusion. Atelectasis at
the lung bases. Airways are patent. No suspicious mass or nodule.

Musculoskeletal: See below for full musculoskeletal details.

CT ABDOMEN PELVIS FINDINGS

Hepatobiliary: Low-density lesions in the liver, for instance on
image 52 of series 2, well-circumscribed area in the RIGHT hepatic
lobe measuring approximately 11 mm and another subcentimeter area in
the anterior LEFT hepatic lobe, an additional 1.7 cm lesion in the
posterior RIGHT hepatic lobe clearly with water density and
well-circumscribed margins. These are compatible with cysts. No
pericholecystic stranding. The portal vein is patent. No biliary
duct dilation.

Pancreas: Normal, without mass, inflammation or ductal dilatation.

Spleen: Normal spleen.

Adrenals/Urinary Tract: Adrenal glands are normal.

Symmetric renal enhancement. No suspicious renal lesion. No
hydronephrosis. The urinary bladder is under distended limiting
assessment.

Stomach/Bowel:

Mild gastric thickening versus under distension.

Small bowel is normal caliber without signs of adjacent inflammation
or visible lesion, limited assessment due to lack of contrast.

No signs of colonic obstruction or acute colonic inflammation. No
pericecal stranding. Appendix is normal.

Vascular/Lymphatic: Tortuous nonaneurysmal abdominal aorta with mild
atherosclerotic plaque. Mildly dilated LEFT common iliac artery up
to 1.5 cm also tortuous.

Smooth contour of the IVC.

There is no gastrohepatic or hepatoduodenal ligament
lymphadenopathy. No retroperitoneal or mesenteric lymphadenopathy.
Engorgement of LEFT ovarian vein.

No pelvic sidewall lymphadenopathy.

Reproductive: Engorgement of parametrial veins and LEFT ovarian vein
as described. LEFT renal vein is patent.

Other: No ascites.

Musculoskeletal: No acute bone finding. No destructive bone process.
Spinal degenerative changes.
IMPRESSION: 1. No adenopathy in the chest, abdomen or pelvis. No signs of
neoplasm.
2. Mild gastric thickening versus under distension. Correlate with
any symptoms of gastritis.
3. Engorgement of parametrial veins and LEFT ovarian vein,
nonspecific but can be seen in the setting of pelvic congestion
syndrome.
4. Aortic atherosclerosis.

Aortic Atherosclerosis ([PD]-[PD]) and Emphysema ([PD]-[PD]).

## 2021-04-27 MED ORDER — ACETAMINOPHEN 325 MG PO TABS: 650.0000 mg | ORAL_TABLET | Freq: Four times a day (QID) | ORAL | Status: DC | PRN

## 2021-04-27 MED ORDER — MAGNESIUM HYDROXIDE 400 MG/5ML PO SUSP
30.0000 mL | Freq: Every day | ORAL | Status: DC | PRN
  Filled 2021-04-27: qty 30

## 2021-04-27 MED ORDER — ASPIRIN EC 81 MG PO TBEC: 81.0000 mg | DELAYED_RELEASE_TABLET | Freq: Every day | ORAL | Status: DC

## 2021-04-27 MED ORDER — SODIUM CHLORIDE 0.9 % IV SOLN: INTRAVENOUS | Status: DC

## 2021-04-27 MED ORDER — IOHEXOL 300 MG/ML  SOLN
75.0000 mL | Freq: Once | INTRAMUSCULAR | Status: AC | PRN
  Administered 2021-04-27: 75 mL via INTRAVENOUS

## 2021-04-27 MED ORDER — ACETAMINOPHEN 650 MG RE SUPP: 650.0000 mg | Freq: Four times a day (QID) | RECTAL | Status: DC | PRN

## 2021-04-27 MED ORDER — ONDANSETRON HCL 4 MG/2ML IJ SOLN
4.0000 mg | Freq: Four times a day (QID) | INTRAMUSCULAR | Status: DC
  Administered 2021-04-27 – 2021-04-28 (×3): 4 mg via INTRAVENOUS
  Filled 2021-04-27 (×2): qty 2

## 2021-04-27 MED ORDER — PANTOPRAZOLE SODIUM 40 MG IV SOLR
40.0000 mg | INTRAVENOUS | Status: DC
  Administered 2021-04-27: 15:00:00 40 mg via INTRAVENOUS
  Filled 2021-04-27: qty 40

## 2021-04-27 MED ORDER — TRAZODONE HCL 50 MG PO TABS: 25.0000 mg | ORAL_TABLET | Freq: Every evening | ORAL | Status: DC | PRN

## 2021-04-27 MED ORDER — LEVETIRACETAM 500 MG PO TABS
500.0000 mg | ORAL_TABLET | Freq: Two times a day (BID) | ORAL | Status: DC
  Administered 2021-04-27 (×2): 500 mg via ORAL
  Filled 2021-04-27 (×5): qty 1

## 2021-04-27 MED ORDER — ENOXAPARIN SODIUM 40 MG/0.4ML IJ SOSY: 40.0000 mg | PREFILLED_SYRINGE | INTRAMUSCULAR | Status: DC

## 2021-04-27 NOTE — ED Notes (Signed)
Neuro Surgeon at bedside.  

## 2021-04-27 NOTE — Progress Notes (Signed)
I met with patient, sister, and daughter (POA) at bedside.  Reviewed that CT CAP was negative, so biopsy of her brain lesion is indicated.  I discussed the planned procedure at length with the patient and her family, including the risks, benefits, alternatives, and indications. The risks discussed include but are not limited to bleeding, infection, need for reoperation, spinal fluid leak, stroke, vision loss, anesthetic complication, coma, paralysis, and even death. I also described in detail that improvement was not guaranteed.  The family expressed understanding of these risks, and asked that we proceed with surgery. I described the surgery in layman's terms, and gave ample opportunity for questions, which were answered to the best of my ability.

## 2021-04-27 NOTE — ED Provider Notes (Signed)
  Patient received in signout from Dr. Kerman Passey pending MRI brain for evaluation of new basal ganglia mass.  MRI reviewed with multifocal areas of enhancement concerning for lymphoma.  I discussed the case with Dr. Elayne Guerin, neurosurgery on-call, who recommends medical admission here at [facility].  Advises and recommends that NO steroids that should be given to the patient.  I will go reassess the patient and discussed this with daughter at the bedside.  Answered questions.  We will discuss the case with hospitalist for admission.  Marland Kitchen1-3 Lead EKG Interpretation Performed by: Vladimir Crofts, MD Authorized by: Vladimir Crofts, MD     Interpretation: normal     ECG rate:  61   ECG rate assessment: normal     Rhythm: sinus rhythm     Ectopy: none     Conduction: normal   .Critical Care Performed by: Vladimir Crofts, MD Authorized by: Vladimir Crofts, MD   Critical care provider statement:    Critical care time (minutes):  30   Critical care was necessary to treat or prevent imminent or life-threatening deterioration of the following conditions:  CNS failure or compromise   Critical care was time spent personally by me on the following activities:  Discussions with consultants, evaluation of patient's response to treatment, examination of patient, ordering and performing treatments and interventions, ordering and review of laboratory studies, ordering and review of radiographic studies, pulse oximetry, re-evaluation of patient's condition, obtaining history from patient or surrogate and review of old charts      Vladimir Crofts, MD 04/27/21 [phone]

## 2021-04-27 NOTE — TOC Initial Note (Signed)
Transition of Care [facility]) - Initial/Assessment Note    Patient Details  Name: Audrey Barron MRN: [id number] Date of Birth: 12/20/1951  Transition of Care [facility]) CM/SW Contact:    Pete Pelt, RN Phone Number: 04/27/2021, 2:18 PM  Clinical Narrative:      Daughter Anderson Malta.at bedside.  Patient lives at home with daughter, who states there are no concerns with getting patient to appointments or getting medications.  Pending diagnosis and needs, daughter is comfortable with patient returning home upon discharge.  Patient has not seen a PCP, daughter is in the process of getting her connected with [facility] in [place].  Daughter also states that she was working on enrolling patient in [facility] in [place], but only received papers on Wednesday, so enrollment is incomplete. TOC contact information given, TOC will follow through discharge.    Expected Discharge Plan:  (TBD) Barriers to Discharge: Continued Medical Work up   Patient Goals and CMS Choice Patient states their goals for this hospitalization and ongoing recovery are:: To find out what is wrong   Choice offered to / list presented to : NA  Expected Discharge Plan and Services Expected Discharge Plan:  (TBD)   Discharge Planning Services: CM Consult Post Acute Care Choice: NA Living arrangements for the past 2 months: Single Family Home                 DME Arranged:  (TBD)         HH Arranged:  (TBD)          Prior Living Arrangements/Services Living arrangements for the past 2 months: Single Family Home Lives with:: Self,Adult Children Patient language and need for interpreter reviewed:: Yes (No interpreter needed) Do you feel safe going back to the place where you live?: Yes      Need for Family Participation in Patient Care: Yes (Comment) Care giver support system in place?: Yes (comment) Current home services:  (See narrative notes) Criminal Activity/Legal Involvement Pertinent to  Current Situation/Hospitalization: No - Comment as needed  Activities of Daily Living Home Assistive Devices/Equipment: None ADL Screening (condition at time of admission) Patient's cognitive ability adequate to safely complete daily activities?: No Is the patient deaf or have difficulty hearing?: Yes Does the patient have difficulty seeing, even when wearing glasses/contacts?: No Does the patient have difficulty concentrating, remembering, or making decisions?: Yes Patient able to express need for assistance with ADLs?: Yes Does the patient have difficulty dressing or bathing?: Yes Independently performs ADLs?: No Communication: Independent Dressing (OT): Needs assistance Is this a change from baseline?: Pre-admission baseline Grooming: Independent with device (comment) Feeding: Independent Bathing: Needs assistance Is this a change from baseline?: Pre-admission baseline Toileting: Independent with device (comment) In/Out Bed: Independent with device (comment) Walks in Home: Independent with device (comment) Does the patient have difficulty walking or climbing stairs?: Yes Weakness of Legs: Both Weakness of Arms/Hands: None  Permission Sought/Granted                  Emotional Assessment Appearance:: Appears stated age Attitude/Demeanor/Rapport: Gracious Affect (typically observed): Pleasant   Alcohol / Substance Use: Not Applicable Psych Involvement: No (comment)  Admission diagnosis:  Intracranial mass [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 Brain mass 04/27/2021   PCP:  Health, [place]:  No Pharmacies Listed    Social Determinants of Health (SDOH) Interventions    Readmission Risk Interventions No  flowsheet data found.

## 2021-04-27 NOTE — ED Notes (Signed)
Sent message to floor nurse , pt has a ready bed

## 2021-04-27 NOTE — H&P (Addendum)
[facility]   PATIENT NAME: Audrey Barron    MR#:  [id number]  DATE OF BIRTH:  January 06, 1951  DATE OF ADMISSION:  04/26/2021  PRIMARY CARE PHYSICIAN: Health, [facility] Personal   Patient is coming from: Home  REQUESTING/REFERRING PHYSICIAN: Vladimir Crofts, MD.  CHIEF COMPLAINT:   Chief Complaint  Patient presents with  . Fall    HISTORY OF PRESENT ILLNESS:  Audrey Barron is a 70 y.o. female with no known medical history as she has not followed with a physician for a long time, who presented to the emergency room with a [name] of unsteady gait and recurrent falls as well as altered mental status with confusion.  She was found outside her home in her underpants.  No reported fever or chills.  No nausea or vomiting or abdominal pain.  No chest pain or palpitations.  No focal muscle weakness.  She denied any slurred speech or facial droop.  No urinary or stool incontinence.  No tinnitus or vertigo. ED Course: When she came to the ER temperature was 99.5 and vital signs were otherwise within normal.  Labs revealed hyponatremia and hypochloremia and were otherwise N unremarkable.  COVID-19 PCR and influenza antigens came back negative.  EKG as reviewed by me : Showed normal sinus rhythm with a rate of 71 with possible left atrial enlargement and Q waves anteroseptally. Imaging: Noncontrasted head CT scan revealed: 1. Large heterogeneous mass centered in the region of the left basal ganglia, with significant surrounding vasogenic edema and mass effect. MRI is recommended for further evaluation. 2. No acute infarct or hemorrhage.  Brain MRI with and without contrast revealed the following: 1. Multifocal abnormal contrast enhancement centered within the left basal ganglia and extending into the left temporal lobe, crossing the midline at the level of the fornix. 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 The lack of necrosis is not characteristic  of glioblastoma, which remains the second consideration. 2. Rightward midline shift measuring 12 mm.  The patient will be admitted to a medical monitored bed for further evaluation and management. PAST MEDICAL HISTORY:   She denies any previous medical problems.  PAST SURGICAL HISTORY:    She denies any previous surgeries.  SOCIAL HISTORY:   Social History   Tobacco Use  . Smoking status: Not on file  . Smokeless tobacco: Not on file  Substance Use Topics  . Alcohol use: Not on file  No history of tobacco EtOH abuse or illicit drug use.  FAMILY HISTORY:  No family history on file.  She denied any familial diseases.  DRUG ALLERGIES:  Not on File  REVIEW OF SYSTEMS:   ROS As per history of present illness. All pertinent systems were reviewed above. Constitutional, HEENT, cardiovascular, respiratory, GI, GU, musculoskeletal, neuro, psychiatric, endocrine, integumentary and hematologic systems were reviewed and are otherwise negative/unremarkable except for positive findings mentioned above in the HPI.   MEDICATIONS AT HOME:   Prior to Admission medications   Not on File      VITAL SIGNS:  Blood pressure (!) 141/101, pulse (!) 58, temperature 98.2 F (36.8 C), temperature source Oral, resp. rate 17, height 5\' 5"  (1.651 m), weight 59 kg, SpO2 96 %.  PHYSICAL EXAMINATION:  Physical Exam  GENERAL:  70 y.o.-year-old female patient lying in the bed with no acute distress.  EYES: Pupils equal, round, reactive to light and accommodation. No scleral icterus. Extraocular muscles intact.  HEENT: Head atraumatic, normocephalic. Oropharynx and nasopharynx clear.  NECK:  Supple, no jugular venous distention. No thyroid enlargement, no tenderness.  LUNGS: Normal breath sounds bilaterally, no wheezing, rales,rhonchi or crepitation. No use of accessory muscles of respiration.  CARDIOVASCULAR: Regular rate and rhythm, S1, S2 normal. No murmurs, rubs, or gallops.  ABDOMEN: Soft,  nondistended, nontender. Bowel sounds present. No organomegaly or mass.  EXTREMITIES: No pedal edema, cyanosis, or clubbing.  NEUROLOGIC: Cranial nerves II through XII are intact. Muscle strength 5/5 in all extremities. Sensation intact. Gait not checked.  PSYCHIATRIC: The patient is alert and oriented x 3.  Normal affect and good eye contact. SKIN: No obvious rash, lesion, or ulcer.   LABORATORY PANEL:   CBC Recent Labs  Lab 04/26/21 1841  WBC 7.7  HGB 13.7  HCT 41.0  PLT 201   ------------------------------------------------------------------------------------------------------------------  Chemistries  Recent Labs  Lab 04/26/21 1841  NA 131*  K 4.0  CL 97*  CO2 27  GLUCOSE 102*  BUN 9  CREATININE 0.62  CALCIUM 9.2   ------------------------------------------------------------------------------------------------------------------  Cardiac Enzymes No results for input(s): TROPONINI in the last 168 hours. ------------------------------------------------------------------------------------------------------------------  RADIOLOGY:  CT Head Wo Contrast  Result Date: 04/26/2021 CLINICAL DATA:  Disoriented, multiple falls, facial droop and weakness from previous stroke EXAM: CT HEAD WITHOUT CONTRAST TECHNIQUE: Contiguous axial images were obtained from the base of the skull through the vertex without intravenous contrast. COMPARISON:  None. FINDINGS: Brain: There is a large mass centered in the region of the left basal ganglia, measuring approximately 4.3 x 3.5 cm. Marked surrounding vasogenic edema throughout the left cerebral hemisphere, with marked mass effect and rightward midline shift measuring approximately 14 mm at the level of the septum pellucidum. There is effacement the basilar cisterns. MRI is recommended for further evaluation. No acute hemorrhage. Marked effacement of the lateral ventricles by the mass effect described above. No acute extra-axial fluid collections.  Vascular: No hyperdense vessel or unexpected calcification. Skull: Normal. Negative for fracture or focal lesion. Sinuses/Orbits: No acute finding. Other: None. IMPRESSION: 1. Large heterogeneous mass centered in the region of the left basal ganglia, with significant surrounding vasogenic edema and mass effect. MRI is recommended for further evaluation. 2. No acute infarct or hemorrhage. Critical Value/emergent results were called by telephone at the time of interpretation on 04/26/2021 at 11:15 pm to provider [facility] , who verbally acknowledged these results. Electronically Signed   By: Randa Ngo M.D.   On: 04/26/2021 23:15   MR Brain W and Wo Contrast  Result Date: 04/27/2021 CLINICAL DATA:  Brain mass EXAM: MRI HEAD WITHOUT AND WITH CONTRAST TECHNIQUE: Multiplanar, multiecho pulse sequences of the brain and surrounding structures were obtained without and with intravenous contrast. CONTRAST:  60mL GADAVIST GADOBUTROL 1 MMOL/ML IV SOLN COMPARISON:  Head CT same day FINDINGS: Brain: There is multifocal abnormal contrast enhancement centered within the left basal ganglia and extending into the left temporal lobe and crossing the midline at the level of the fornix. There is a large amount of surrounding hyperintense T2-weighted signal. At the level of the foramina of [place], there is rightward midline shift measuring 12 mm. The contrast enhancement pattern is predominantly solid. Vascular: Major flow voids are preserved. Skull and upper cervical spine: Normal calvarium and skull base. Visualized upper cervical spine and soft tissues are normal. Sinuses/Orbits:No paranasal sinus fluid levels or advanced mucosal thickening. No mastoid or middle ear effusion. Normal orbits. IMPRESSION: 1. Multifocal abnormal contrast enhancement centered within the left basal ganglia and extending into the left temporal lobe, crossing the  midline at the level of the fornix. 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 The lack  of necrosis is not characteristic of glioblastoma, which remains the second consideration. 2. Rightward midline shift measuring 12 mm. Electronically Signed   By: Ulyses Jarred M.D.   On: 04/27/2021 00:37      IMPRESSION AND PLAN:  Active Problems:   Brain mass  1.  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  This is leading to right midline shift measuring 12 mm.  There is no necrosis to suggest glioblastoma. - The patient will be admitted to a medical monitored bed. - Neurochecks will be followed every 4 hours for 24 hours. - Neurosurgery consult will be obtained. - Dr. Izora Ribas was notified about the patient.  He recommended no steroids at this time. - She will need a brain biopsy.  2.  Unsteady gait and altered mental status with confusion. - Physical therapy consult to be obtained. - We will follow mental status as mentioned above.  3.  Hyponatremia and hypochloremia. - The patient will be hydrated with IV normal saline and will follow BMP.  DVT prophylaxis: SCDs.  Medical prophylaxis currently contraindicated due to bleeding risk. Code Status: full code.  Family Communication:  The plan of care was discussed in details with the patient (and family). I answered all questions. The patient agreed to proceed with the above mentioned plan. Further management will depend upon hospital course. Disposition Plan: Back to previous home environment Consults called: Neurosurgery consult. All the records are reviewed and case discussed with ED provider.  Status is: Inpatient  Remains inpatient appropriate because:Altered mental status, Ongoing diagnostic testing needed not appropriate for outpatient work up, Unsafe d/c plan, IV treatments appropriate due to intensity of illness or inability to take PO and Inpatient level of care appropriate due to severity of illness   Dispo: The patient is from: Home              Anticipated d/c is to: Home               Patient currently is not medically stable to d/c.   Difficult to place patient No  TOTAL TIME TAKING CARE OF THIS PATIENT: 55 minutes.    Christel Mormon M.D on 04/27/2021 at 2:43 AM  [facility]   From 7 PM-7 AM, contact night-coverage [email or web address]  CC: Primary care physician; Health, [facility] Dept Personal

## 2021-04-27 NOTE — H&P (View-Only) (Signed)
I met with patient, sister, and daughter (POA) at bedside.  Reviewed that CT CAP was negative, so biopsy of her brain lesion is indicated.  I discussed the planned procedure at length with the patient and her family, including the risks, benefits, alternatives, and indications. The risks discussed include but are not limited to bleeding, infection, need for reoperation, spinal fluid leak, stroke, vision loss, anesthetic complication, coma, paralysis, and even death. I also described in detail that improvement was not guaranteed.  The family expressed understanding of these risks, and asked that we proceed with surgery. I described the surgery in layman's terms, and gave ample opportunity for questions, which were answered to the best of my ability.  

## 2021-04-27 NOTE — ED Notes (Signed)
Per Neuro / Brain biopsy scheduled for tomorrow / no asa or blood thinners.

## 2021-04-27 NOTE — ED Notes (Signed)
Joneen Caraway RN aware of assigned bed

## 2021-04-27 NOTE — Consult Note (Addendum)
Referring Physician:  No referring provider defined for this encounter.  Primary Physician:  [facility], [facility] Dept Personal  Chief Complaint:  confusion  She is unable to give a history - level 5 qualifier.  History of Present Illness: 04/27/2021 Audrey Barron is a 70 y.o. female who presents with the chief complaint of confusion.  She was found outside her home undressed, and brought in for evaluation.  She has not followed with a physician in some time.  She does not answer many questions for me. She is right handed.  She denies headache, nausea, and vomiting.  I am unable to gather more history from the patient.  Her daughter reports that she started acting abnormally around the start of 2022, and has had worsening behavior over time. Per daughter, she probably has not taken her aspirin for several days.  Review of Systems:  A 10 point review of systems is negative, except for the pertinent positives and negatives detailed in the HPI.  Past Medical History: No past medical history on file.  Past Surgical History: Patient does not provide history  Allergies: Allergies as of 04/26/2021  . (Not on File)    Medications:  Current Facility-Administered Medications:  .  0.9 %  sodium chloride infusion, , Intravenous, Continuous, Mansy, Jan A, MD, Last Rate: 100 mL/hr at 04/27/21 0324, New Bag at 04/27/21 0324 .  acetaminophen (TYLENOL) tablet 650 mg, 650 mg, Oral, Q6H PRN **OR** acetaminophen (TYLENOL) suppository 650 mg, 650 mg, Rectal, Q6H PRN, Mansy, Jan A, MD .  aspirin EC tablet 81 mg, 81 mg, Oral, Daily, Mansy, Jan A, MD .  magnesium hydroxide (MILK OF MAGNESIA) suspension 30 mL, 30 mL, Oral, Daily PRN, Mansy, Jan A, MD .  traZODone (DESYREL) tablet 25 mg, 25 mg, Oral, QHS PRN, Mansy, Jan A, MD  Current Outpatient Medications:  .  aspirin 81 MG chewable tablet, Chew 81 mg by mouth daily., Disp: , Rfl:  .  cyanocobalamin 1000 MCG tablet, Take 1,000 mcg  by mouth daily., Disp: , Rfl:    Social History:    Family Medical History: No family history on file.  Physical Examination: Vitals:   04/27/21 0049 04/27/21 0152  BP: (!) 143/94 (!) 141/101  Pulse: (!) 58 (!) 58  Resp: 18 17  Temp: 98.2 F (36.8 C)   SpO2: 96% 96%     General: Patient is well developed, well nourished, calm, collected, and in no apparent distress.  Psychiatric: Patient is non-anxious.  Head:  Pupils equal, round, and reactive to light.  ENT:  Oral mucosa appears well hydrated.  Neck:   Supple.  Full range of motion.  Respiratory: Patient is breathing without any difficulty.  Extremities: No edema.  Vascular: Palpable pulses in dorsal pedal vessels.  Skin:   On exposed skin, there are no abnormal skin lesions.  NEUROLOGICAL:  General: In no acute distress.   Awake, alert,. Opens eyes spontaneously.  She is oriented to person, but not place or year.  She can name 2 of 3 objects, but cannot repeat.  She follows simple but not complex commands.  I am concerned she has a R visual field cut.  Pupils equal round and reactive to light.  Facial tone shows mild R facial.   Tongue protrusion is midline.  There is R pronator drift.  She cooperates with motor examination, which shows full strength on the LUE and LLE with diffusion 4-4+ strength in RUE and R IP.  She gives me  5/5 strength in the rest of RLE.  She reports full sensation.  Reflexes are 2+ and symmetric at the biceps, triceps, brachioradialis, patella and achilles. Hoffman's is absent.  Clonus is not present.  Toes are down-going.    Gait is untested. Imaging: MRI brain 04/26/21  IMPRESSION: 1. Multifocal abnormal contrast enhancement centered within the left basal ganglia and extending into the left temporal lobe, crossing the midline at the level of the fornix. 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 The lack of necrosis is not characteristic of glioblastoma, which remains the second  consideration. 2. Rightward midline shift measuring 12 mm.   Electronically Signed   By: Ulyses Jarred M.D.   On: 04/27/2021 00:37  I have personally reviewed the images and agree with the above interpretation.  Labs: CBC Latest Ref Rng & Units 04/27/2021 04/26/2021  WBC 4.0 - 10.5 K/uL 7.3 7.7  Hemoglobin 12.0 - 15.0 g/dL 13.7 13.7  Hematocrit 36.0 - 46.0 % 40.0 41.0  Platelets 150 - 400 K/uL 194 201       Assessment and Plan: [name] is a pleasant 70 y.o. female with large L heterogeneously enhancing lesion, concerning for neoplasm.  The differential includes glioblastoma and lymphoma.  This is not consistent with metastasis, but I would recommend confirming no other primary with a CT CAP.  I will order this.  She may need a biopsy.  The tumor is not resectable.  - Admit medicine - CT CAP - Would start keppra 500 mg BID - Appreciate medical stabilization for possible open biopsy tomorrow   Ardyth Gal K. Izora Ribas MD, [place] Dept. of Neurosurgery

## 2021-04-27 NOTE — ED Notes (Signed)
Report given to floor nurse , advised that pt is going to CT first .

## 2021-04-27 NOTE — Progress Notes (Signed)
Patient seen and examined personally, I reviewed the chart, history and physical and admission note, done by admitting physician this morning and agree with the same with following addendum.  Please refer to the morning admission note for more detailed plan of care.  Briefly, 70 year old female who has not seen a physician in a long time presented with unsteady gait, recurrent fall, altered mental status with confusion.  She was found outside of 4 home in her underpants.  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  Patient was admitted neurosurgery was consulted.   Seen in the ED.  Resting comfortably denies any complaint  Left basal ganglia brain mass heterogeneously enhancing lesion concerning for neoplasm: Neurosurgery input appreciated obtaining further work-up with CT CAP and planning for biopsy n.p.o. past midnight, starting Keppra 5 mg twice daily for prophylaxis.  Unsteady gait and altered mental status acute metabolic encephalopathy likely due to #1.  Mental status will be monitored continue PT OT.  Obtain TSH, B12 and B1.    Hyponatremia/hypochloremia: Gently hydrate Discussed with the neurosurgery team,  NSx has spoken to patient's daughter

## 2021-04-27 NOTE — ED Notes (Signed)
Patient returned from MRI.

## 2021-04-27 NOTE — H&P (View-Only) (Signed)
Referring Physician:  No referring provider defined for this encounter.  Primary Physician:  [facility], [facility] Personal  Chief Complaint:  confusion  She is unable to give a history - level 5 qualifier.  History of Present Illness: 04/27/2021 Audrey Barron is a 70 y.o. female who presents with the chief complaint of confusion.  She was found outside her home undressed, and brought in for evaluation.  She has not followed with a physician in some time.  She does not answer many questions for me. She is right handed.  She denies headache, nausea, and vomiting.  I am unable to gather more history from the patient.  Her daughter reports that she started acting abnormally around the start of 2022, and has had worsening behavior over time. Per daughter, she probably has not taken her aspirin for several days.  Review of Systems:  A 10 point review of systems is negative, except for the pertinent positives and negatives detailed in the HPI.  Past Medical History: No past medical history on file.  Past Surgical History: Patient does not provide history  Allergies: Allergies as of 04/26/2021  . (Not on File)    Medications:  Current Facility-Administered Medications:  .  0.9 %  sodium chloride infusion, , Intravenous, Continuous, Mansy, Jan A, MD, Last Rate: 100 mL/hr at 04/27/21 0324, New Bag at 04/27/21 0324 .  acetaminophen (TYLENOL) tablet 650 mg, 650 mg, Oral, Q6H PRN **OR** acetaminophen (TYLENOL) suppository 650 mg, 650 mg, Rectal, Q6H PRN, Mansy, Jan A, MD .  aspirin EC tablet 81 mg, 81 mg, Oral, Daily, Mansy, Jan A, MD .  magnesium hydroxide (MILK OF MAGNESIA) suspension 30 mL, 30 mL, Oral, Daily PRN, Mansy, Jan A, MD .  traZODone (DESYREL) tablet 25 mg, 25 mg, Oral, QHS PRN, Mansy, Jan A, MD  Current Outpatient Medications:  .  aspirin 81 MG chewable tablet, Chew 81 mg by mouth daily., Disp: , Rfl:  .  cyanocobalamin 1000 MCG tablet, Take 1,000 mcg  by mouth daily., Disp: , Rfl:    Social History:    Family Medical History: No family history on file.  Physical Examination: Vitals:   04/27/21 0049 04/27/21 0152  BP: (!) 143/94 (!) 141/101  Pulse: (!) 58 (!) 58  Resp: 18 17  Temp: 98.2 F (36.8 C)   SpO2: 96% 96%     General: Patient is well developed, well nourished, calm, collected, and in no apparent distress.  Psychiatric: Patient is non-anxious.  Head:  Pupils equal, round, and reactive to light.  ENT:  Oral mucosa appears well hydrated.  Neck:   Supple.  Full range of motion.  Respiratory: Patient is breathing without any difficulty.  Extremities: No edema.  Vascular: Palpable pulses in dorsal pedal vessels.  Skin:   On exposed skin, there are no abnormal skin lesions.  NEUROLOGICAL:  General: In no acute distress.   Awake, alert,. Opens eyes spontaneously.  She is oriented to person, but not place or year.  She can name 2 of 3 objects, but cannot repeat.  She follows simple but not complex commands.  I am concerned she has a R visual field cut.  Pupils equal round and reactive to light.  Facial tone shows mild R facial.   Tongue protrusion is midline.  There is R pronator drift.  She cooperates with motor examination, which shows full strength on the LUE and LLE with diffusion 4-4+ strength in RUE and R IP.  She gives me  5/5 strength in the rest of RLE.  She reports full sensation.  Reflexes are 2+ and symmetric at the biceps, triceps, brachioradialis, patella and achilles. Hoffman's is absent.  Clonus is not present.  Toes are down-going.    Gait is untested. Imaging: MRI brain 04/26/21  IMPRESSION: 1. Multifocal abnormal contrast enhancement centered within the left basal ganglia and extending into the left temporal lobe, crossing the midline at the level of the fornix. 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 The lack of necrosis is not characteristic of glioblastoma, which remains the second  consideration. 2. Rightward midline shift measuring 12 mm.   Electronically Signed   By: Ulyses Jarred M.D.   On: 04/27/2021 00:37  I have personally reviewed the images and agree with the above interpretation.  Labs: CBC Latest Ref Rng & Units 04/27/2021 04/26/2021  WBC 4.0 - 10.5 K/uL 7.3 7.7  Hemoglobin 12.0 - 15.0 g/dL 13.7 13.7  Hematocrit 36.0 - 46.0 % 40.0 41.0  Platelets 150 - 400 K/uL 194 201       Assessment and Plan: [name] is a pleasant 70 y.o. female with large L heterogeneously enhancing lesion, concerning for neoplasm.  The differential includes glioblastoma and lymphoma.  This is not consistent with metastasis, but I would recommend confirming no other primary with a CT CAP.  I will order this.  She may need a biopsy.  The tumor is not resectable.  - Admit medicine - CT CAP - Would start keppra 500 mg BID - Appreciate medical stabilization for possible open biopsy tomorrow   Ardyth Gal K. Izora Ribas MD, [place] Dept. of Neurosurgery

## 2021-04-28 ENCOUNTER — Inpatient Hospital Stay: Payer: Medicare HMO | Admitting: Anesthesiology

## 2021-04-28 ENCOUNTER — Encounter
Admission: EM | Disposition: A | Discharge status: Home Health | Payer: Self-pay | Source: Home / Self Care | Attending: Pulmonary Disease

## 2021-04-28 ENCOUNTER — Inpatient Hospital Stay: Payer: Medicare HMO

## 2021-04-28 ENCOUNTER — Encounter: Payer: Self-pay | Admitting: Family Medicine

## 2021-04-28 DIAGNOSIS — G9389 Other specified disorders of brain: Secondary | ICD-10-CM

## 2021-04-28 DIAGNOSIS — E871 Hypo-osmolality and hyponatremia: Secondary | ICD-10-CM

## 2021-04-28 HISTORY — PX: CRANIOTOMY: SHX93

## 2021-04-28 HISTORY — PX: APPLICATION OF CRANIAL NAVIGATION: SHX6578

## 2021-04-28 LAB — OSMOLALITY: Osmolality: 249 mOsm/kg — CL (ref 275–295)

## 2021-04-28 LAB — BASIC METABOLIC PANEL
Anion gap: 11 (ref 5–15)
Anion gap: 11 (ref 5–15)
BUN: 6 mg/dL — ABNORMAL LOW (ref 8–23)
BUN: 7 mg/dL — ABNORMAL LOW (ref 8–23)
CO2: 20 mmol/L — ABNORMAL LOW (ref 22–32)
CO2: 18 mmol/L — ABNORMAL LOW (ref 22–32)
Calcium: 9.1 mg/dL (ref 8.9–10.3)
Calcium: 8.8 mg/dL — ABNORMAL LOW (ref 8.9–10.3)
Chloride: 90 mmol/L — ABNORMAL LOW (ref 98–111)
Chloride: 89 mmol/L — ABNORMAL LOW (ref 98–111)
Creatinine, Ser: 0.43 mg/dL — ABNORMAL LOW (ref 0.44–1.00)
Creatinine, Ser: 0.46 mg/dL (ref 0.44–1.00)
GFR, Estimated: >60 mL/min (ref >60)
GFR, Estimated: >60 mL/min (ref >60)
Glucose, Bld: 101 mg/dL — ABNORMAL HIGH (ref 70–99)
Glucose, Bld: 150 mg/dL — ABNORMAL HIGH (ref 70–99)
Potassium: 4 mmol/L (ref 3.5–5.1)
Potassium: 3.7 mmol/L (ref 3.5–5.1)
Sodium: 121 mmol/L — ABNORMAL LOW (ref 135–145)
Sodium: 118 mmol/L — CL (ref 135–145)

## 2021-04-28 LAB — HEPATIC FUNCTION PANEL
ALT: 18 U/L (ref 0–44)
AST: 19 U/L (ref 15–41)
Albumin: 3.8 g/dL (ref 3.5–5.0)
Alkaline Phosphatase: 25 U/L — ABNORMAL LOW (ref 38–126)
Bilirubin, Direct: 0.3 mg/dL — ABNORMAL HIGH (ref 0.0–0.2)
Indirect Bilirubin: 0.9 mg/dL (ref 0.3–0.9)
Total Bilirubin: 1.2 mg/dL (ref 0.3–1.2)
Total Protein: 6.9 g/dL (ref 6.5–8.1)

## 2021-04-28 LAB — SODIUM
Sodium: 120 mmol/L — ABNORMAL LOW (ref 135–145)
Sodium: 118 mmol/L — CL (ref 135–145)
Sodium: 121 mmol/L — ABNORMAL LOW (ref 135–145)
Sodium: 123 mmol/L — ABNORMAL LOW (ref 135–145)

## 2021-04-28 LAB — MRSA PCR SCREENING: MRSA by PCR: NEGATIVE

## 2021-04-28 LAB — CBC
HCT: 40.3 % (ref 36.0–46.0)
Hemoglobin: 14.1 g/dL (ref 12.0–15.0)
MCH: 28.3 pg (ref 26.0–34.0)
MCHC: 35 g/dL (ref 30.0–36.0)
MCV: 80.9 fL (ref 80.0–100.0)
Platelets: 213 10*3/uL (ref 150–400)
RBC: 4.98 MIL/uL (ref 3.87–5.11)
RDW: 11.5 % (ref 11.5–15.5)
WBC: 8.8 10*3/uL (ref 4.0–10.5)
nRBC: 0 % (ref 0.0–0.2)

## 2021-04-28 LAB — GLUCOSE, CAPILLARY
Glucose-Capillary: 110 mg/dL — ABNORMAL HIGH (ref 70–99)
Glucose-Capillary: 142 mg/dL — ABNORMAL HIGH (ref 70–99)

## 2021-04-28 IMAGING — DX DG ABDOMEN 1V
1 series · 1 of 1 positions shown · non-contrast
Comparison: CT [DATE]

CLINICAL DATA: NG tube placement

EXAM:
ABDOMEN - 1 VIEW

[abdomen supine]
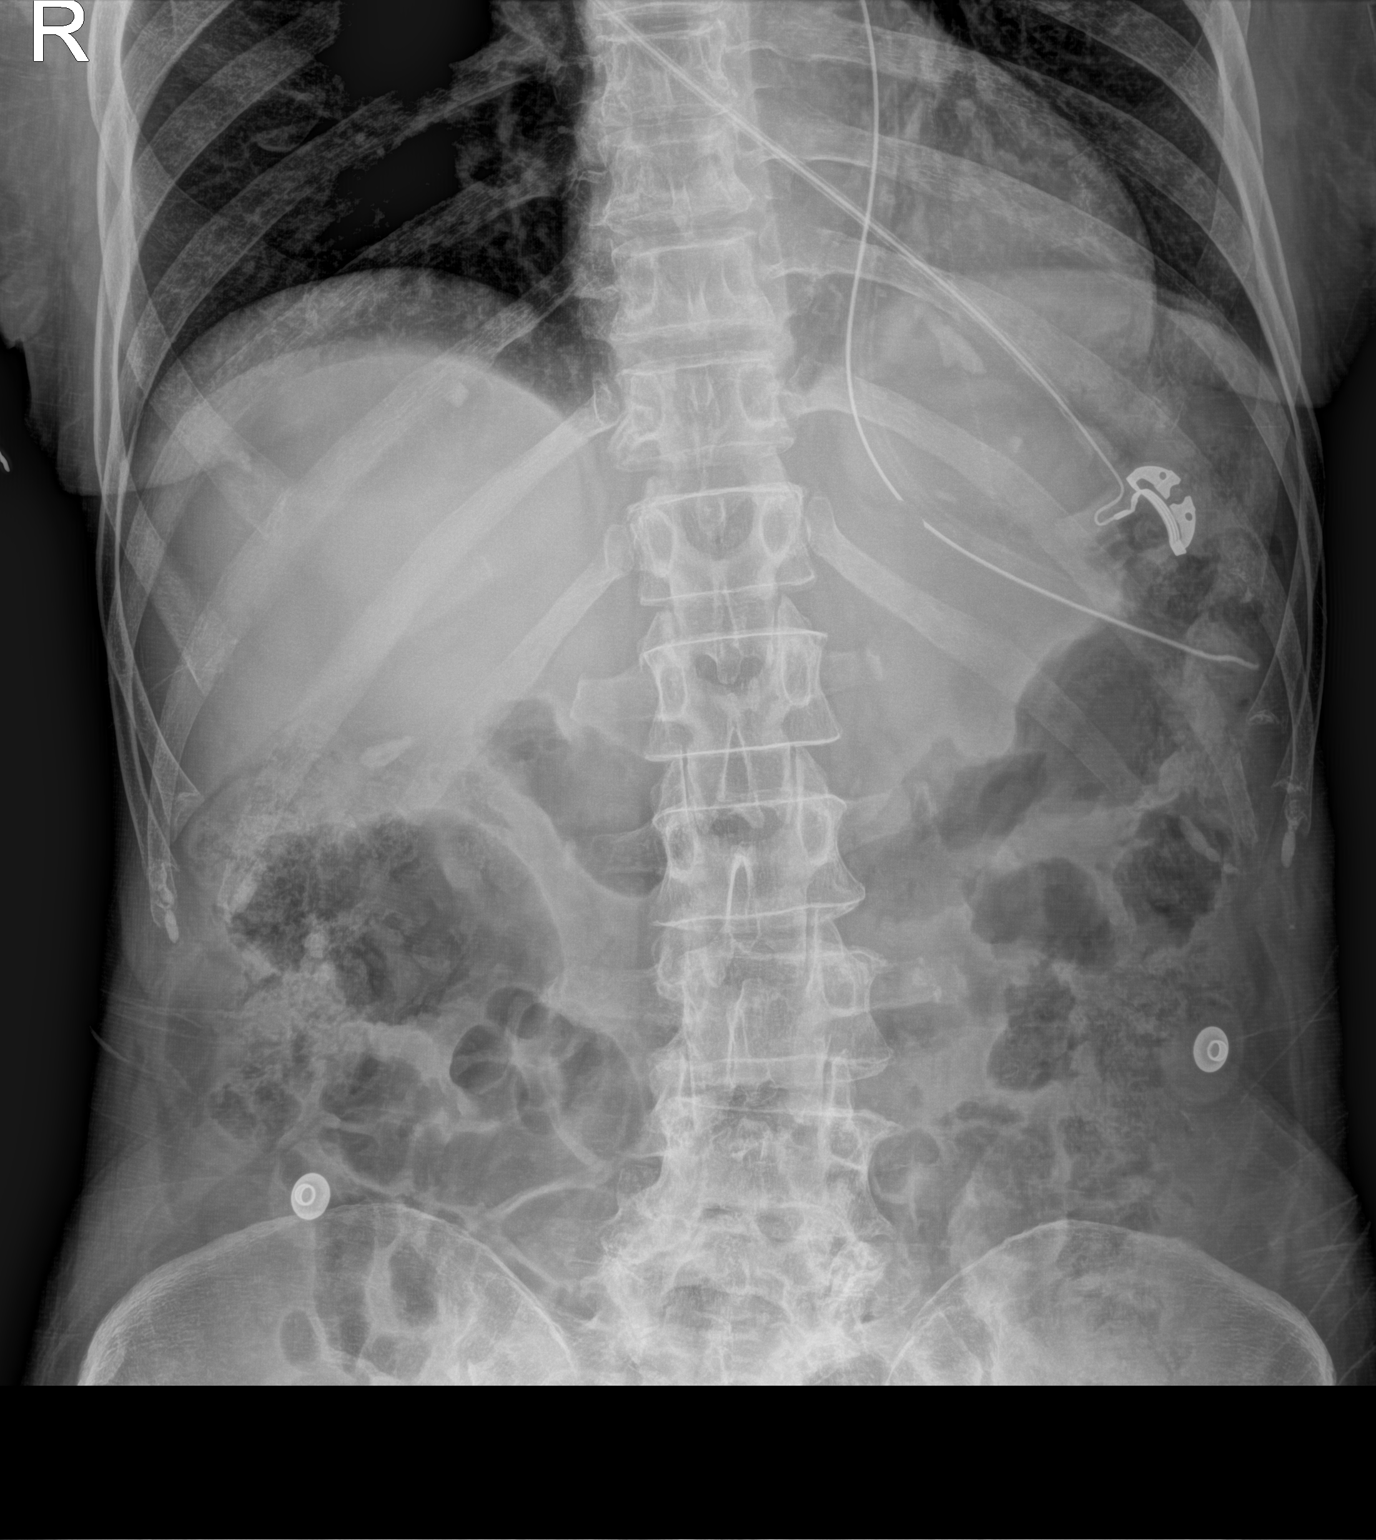

[1 of 1 positions shown; findings below may reference images not displayed]

FINDINGS: Esophageal tube tip in the left upper quadrant over the mid stomach.
Mild diffuse increased bowel gas without obstructive pattern.
IMPRESSION: Esophageal tube tip overlies the proximal to mid stomach.

## 2021-04-28 IMAGING — CT CT HEAD W/O CM
3 series · 15 of 47 positions shown, 18 images · non-contrast
Comparison: [DATE]

CLINICAL DATA: Post biopsy

EXAM:
CT HEAD WITHOUT CONTRAST
TECHNIQUE: Contiguous axial images were obtained from the base of the skull
through the vertex without intravenous contrast.

[Series 3: head wo · axial · 0.42mm/px · z∈[-124,+11]mm · 9 of 33 slices shown, 12 images]
[im 3/33  brain]
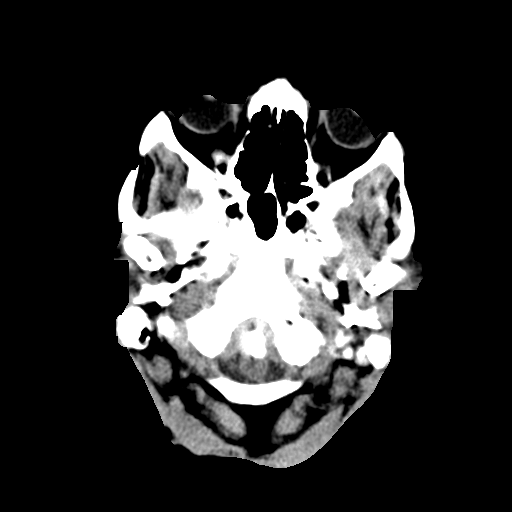
[im 3/33  bone]
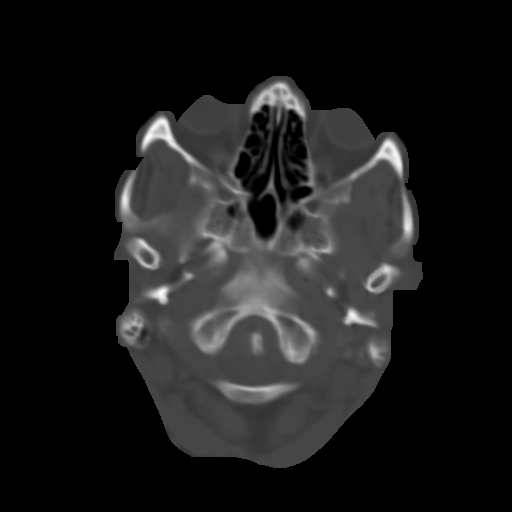
[im 6/33  brain]
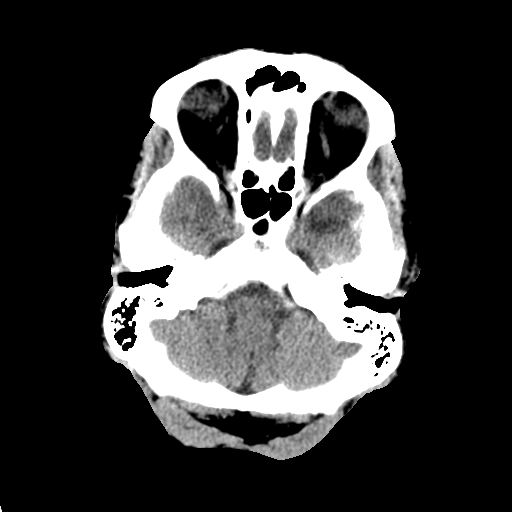
[im 9/33  brain]
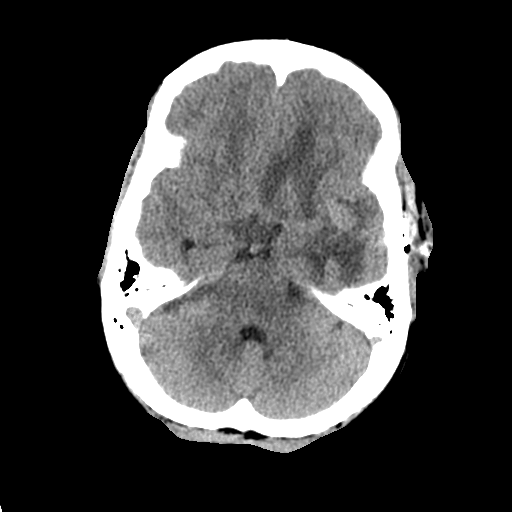
[im 13/33  brain]
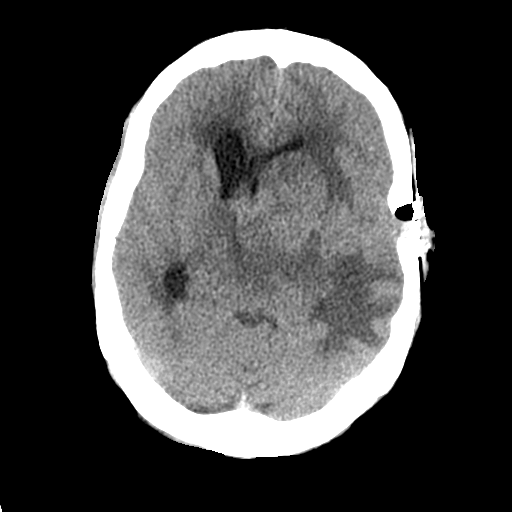
[im 17/33  brain]
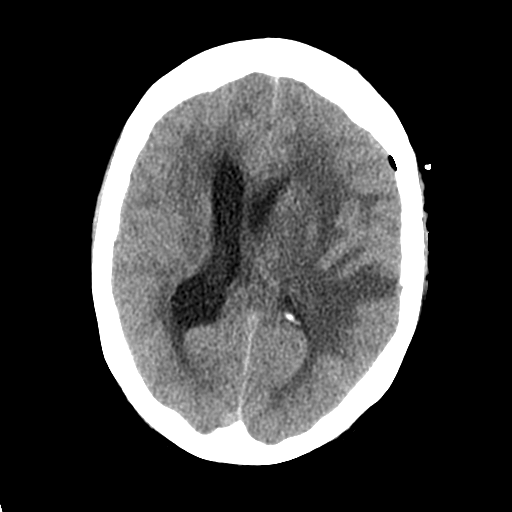
[im 17/33  bone]
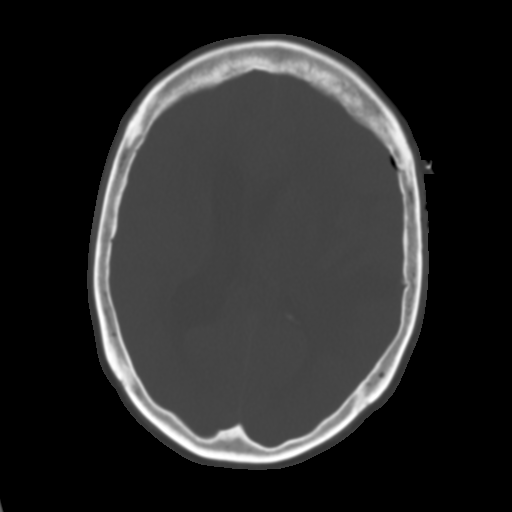
[im 20/33  brain]
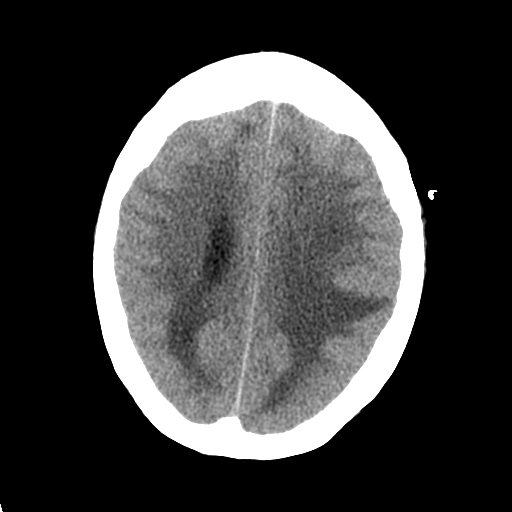
[im 24/33  brain]
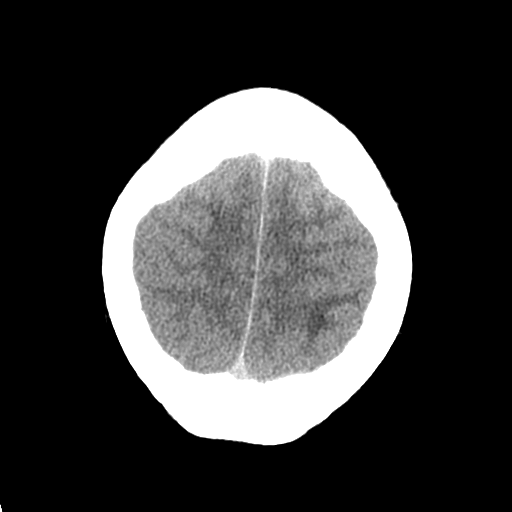
[im 27/33  brain]
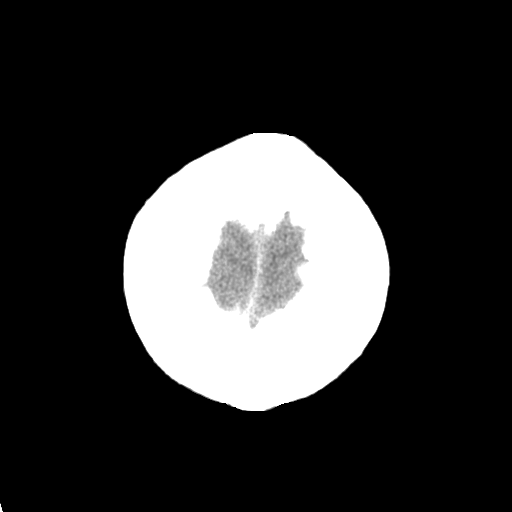
[im 30/33  brain]
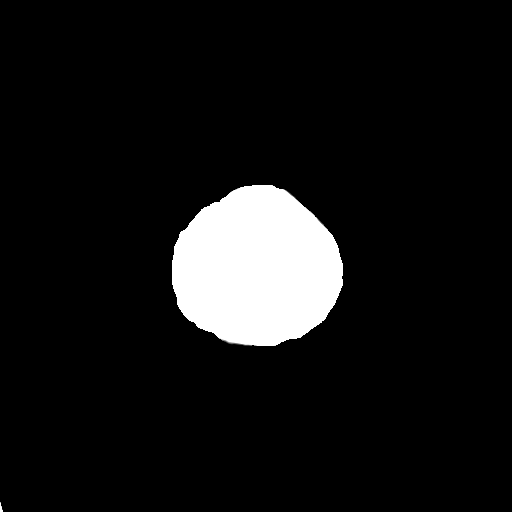
[im 30/33  bone]
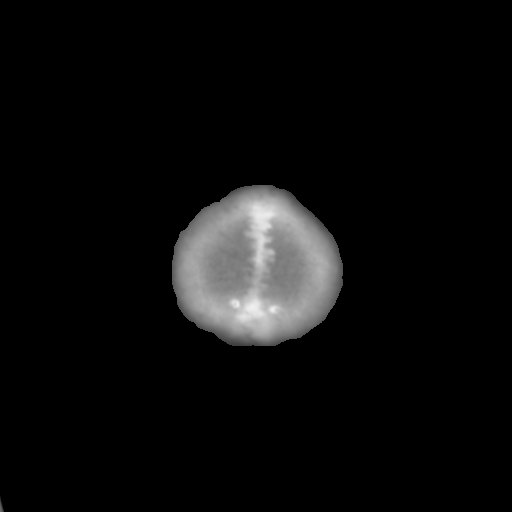

[Series 4: coronal soft tissue · coronal · 0.34mm/px · 3 of 66 slices shown]
[im 22/66  brain]
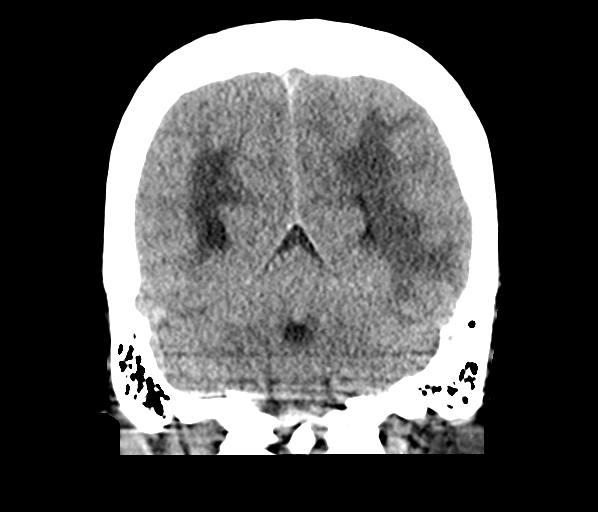
[im 29/66  brain]
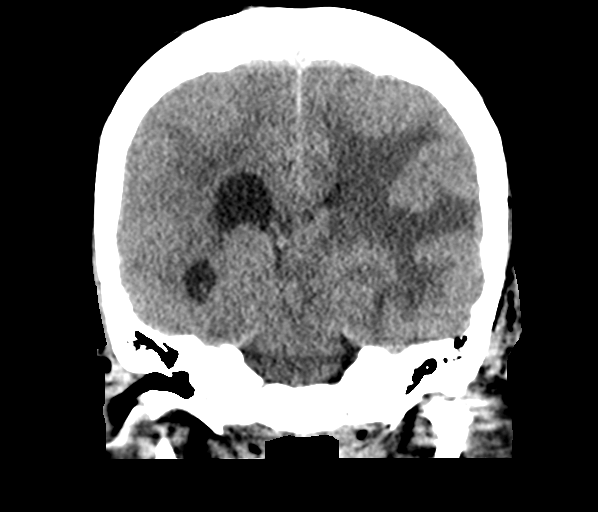
[im 37/66  brain]
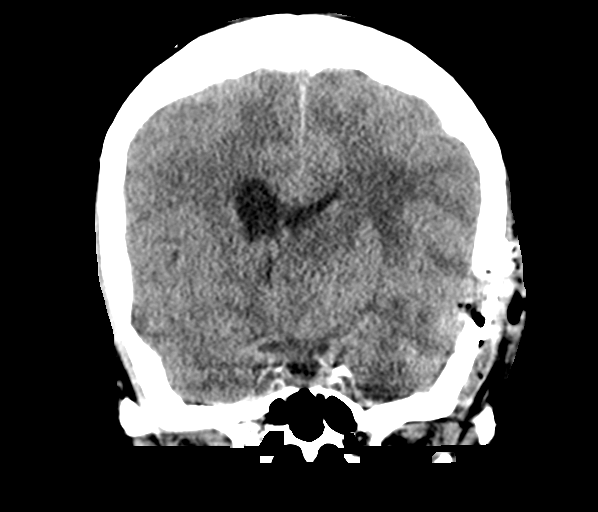

[Series 5: sagittal soft tissue · sagittal · 0.33mm/px · 3 of 56 slices shown]
[im 19/56  brain]
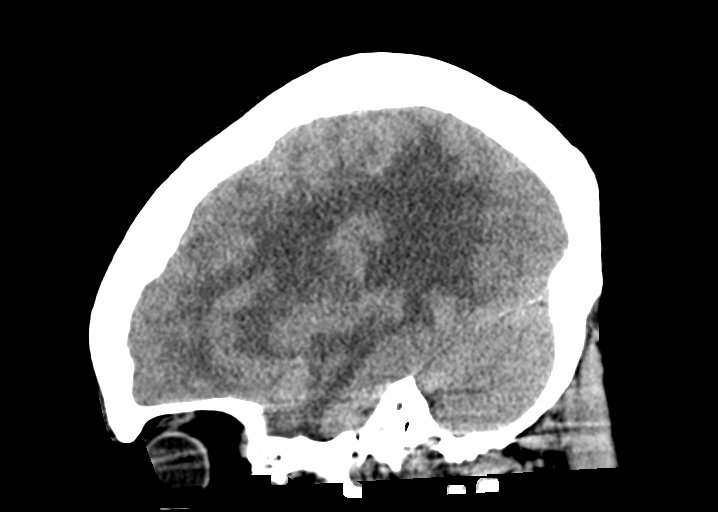
[im 28/56  brain]
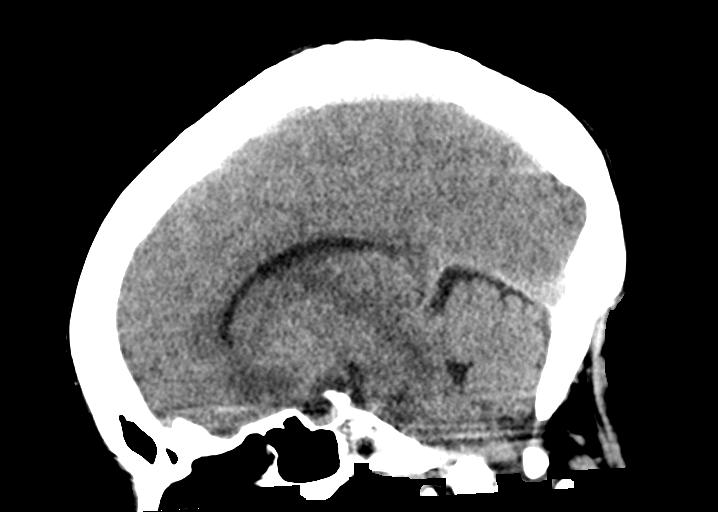
[im 37/56  brain]
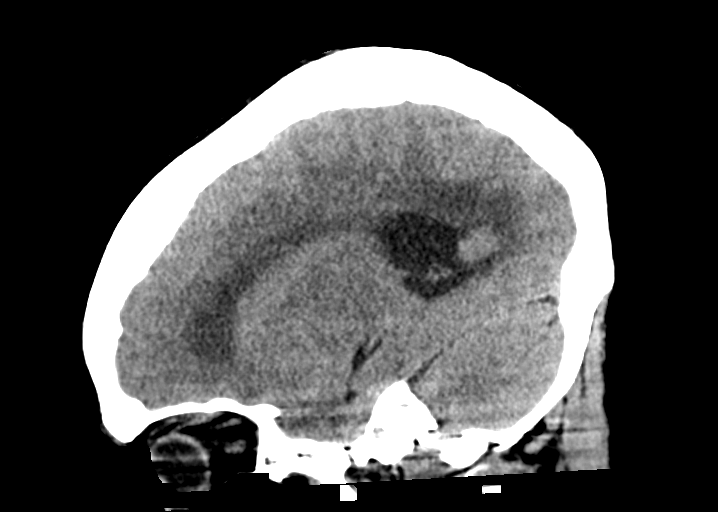

[15 of 47 positions shown; findings below may reference images not displayed]

FINDINGS: Brain: Small peripheral biopsy sites is present along the
superolateral left temporal lobe containing air and blood products.
Minimal extra-axial air is present along the cerebral convexity.
Stable appearance of relatively hyperattenuating lesion centered
within the left basal ganglia with contralateral extension and
surrounding hypoattenuation corresponding to abnormal signal on MRI.
There is stable rightward midline shift and trapping of the right
lateral ventricle as well as partial effacement of basal cisterns.
No new loss of gray-white differentiation.

Vascular: No new findings.

Skull: New small left craniotomy.

Sinuses/Orbits: No acute abnormality

Other: None.
IMPRESSION: Expected post biopsy changes. Otherwise stable appearance of mass
and associated mass effect.

## 2021-04-28 SURGERY — CRANIOTOMY TUMOR EXCISION
Anesthesia: General

## 2021-04-28 MED ORDER — ACETAMINOPHEN 650 MG RE SUPP: 650.0000 mg | RECTAL | Status: DC | PRN

## 2021-04-28 MED ORDER — PHENYLEPHRINE HCL (PRESSORS) 10 MG/ML IV SOLN
INTRAVENOUS | Status: DC | PRN
  Administered 2021-04-28: 100 ug via INTRAVENOUS

## 2021-04-28 MED ORDER — POLYETHYLENE GLYCOL 3350 17 G PO PACK: 17.0000 g | PACK | Freq: Every day | ORAL | Status: DC | PRN

## 2021-04-28 MED ORDER — ORAL CARE MOUTH RINSE: 15.0000 mL | Freq: Once | OROMUCOSAL | Status: DC

## 2021-04-28 MED ORDER — SODIUM CHLORIDE 0.9 % IV SOLN
INTRAVENOUS | Status: DC | PRN
  Administered 2021-04-28: 500 mg via INTRAVENOUS

## 2021-04-28 MED ORDER — MANNITOL 25 % IV SOLN
INTRAVENOUS | Status: AC
  Filled 2021-04-28: qty 50

## 2021-04-28 MED ORDER — CONIVAPTAN 20 MG/DEXTROSE 5 % 100ML IV SOLN: 0.8330 mg/h | Freq: Once | INTRAVENOUS | Status: DC

## 2021-04-28 MED ORDER — SODIUM CHLORIDE 0.9 % IV SOLN
INTRAVENOUS | Status: DC | PRN
  Administered 2021-04-28: .05 ug/kg/min via INTRAVENOUS

## 2021-04-28 MED ORDER — SODIUM CHLORIDE 3 % IV SOLN
INTRAVENOUS | Status: DC
  Administered 2021-04-28: 20 mL/h via INTRAVENOUS
  Filled 2021-04-28 (×4): qty 500

## 2021-04-28 MED ORDER — CHLORHEXIDINE GLUCONATE 0.12 % MT SOLN: 15.0000 mL | Freq: Once | OROMUCOSAL | Status: DC

## 2021-04-28 MED ORDER — PROPOFOL 10 MG/ML IV BOLUS
INTRAVENOUS | Status: DC | PRN
  Administered 2021-04-28: 70 mg via INTRAVENOUS

## 2021-04-28 MED ORDER — PROPOFOL 500 MG/50ML IV EMUL
INTRAVENOUS | Status: DC | PRN
  Administered 2021-04-28: 75 ug/kg/min via INTRAVENOUS

## 2021-04-28 MED ORDER — LEVETIRACETAM IN NACL 500 MG/100ML IV SOLN
500.0000 mg | Freq: Two times a day (BID) | INTRAVENOUS | Status: DC
  Administered 2021-04-28 – 2021-05-02 (×8): 500 mg via INTRAVENOUS
  Filled 2021-04-28 (×10): qty 100

## 2021-04-28 MED ORDER — MANNITOL 25 % IV SOLN
INTRAVENOUS | Status: DC | PRN
  Administered 2021-04-28: 30 g via INTRAVENOUS

## 2021-04-28 MED ORDER — PROMETHAZINE HCL 25 MG PO TABS
12.5000 mg | ORAL_TABLET | ORAL | Status: DC | PRN
Start: 2021-04-28 — End: 2021-05-02
  Filled 2021-04-28: qty 1

## 2021-04-28 MED ORDER — DEXAMETHASONE SODIUM PHOSPHATE 4 MG/ML IJ SOLN: 4.0000 mg | Freq: Three times a day (TID) | INTRAMUSCULAR | Status: DC

## 2021-04-28 MED ORDER — SENNOSIDES-DOCUSATE SODIUM 8.6-50 MG PO TABS
2.0000 | ORAL_TABLET | Freq: Two times a day (BID) | ORAL | Status: DC
  Administered 2021-04-28 – 2021-05-02 (×4): 2
  Filled 2021-04-28 (×4): qty 2

## 2021-04-28 MED ORDER — DEXAMETHASONE SODIUM PHOSPHATE 4 MG/ML IJ SOLN: 4.0000 mg | Freq: Four times a day (QID) | INTRAMUSCULAR | Status: DC

## 2021-04-28 MED ORDER — FENTANYL CITRATE (PF) 100 MCG/2ML IJ SOLN
25.0000 ug | INTRAMUSCULAR | Status: DC | PRN
Start: 2021-04-28 — End: 2021-04-28

## 2021-04-28 MED ORDER — HYDROCODONE-ACETAMINOPHEN 5-325 MG PO TABS: 1.0000 | ORAL_TABLET | ORAL | Status: DC | PRN

## 2021-04-28 MED ORDER — BACITRACIN 500 UNIT/GM EX OINT
TOPICAL_OINTMENT | CUTANEOUS | Status: DC | PRN
  Administered 2021-04-28: 1 via TOPICAL

## 2021-04-28 MED ORDER — HYDROCODONE-ACETAMINOPHEN 5-325 MG PO TABS
1.0000 | ORAL_TABLET | ORAL | Status: DC | PRN
  Administered 2021-04-29: 1
  Filled 2021-04-28: qty 1

## 2021-04-28 MED ORDER — ONDANSETRON HCL 4 MG/2ML IJ SOLN: 4.0000 mg | INTRAMUSCULAR | Status: DC | PRN

## 2021-04-28 MED ORDER — LABETALOL HCL 5 MG/ML IV SOLN: 10.0000 mg | INTRAVENOUS | Status: DC | PRN

## 2021-04-28 MED ORDER — CHLORHEXIDINE GLUCONATE CLOTH 2 % EX PADS
6.0000 | MEDICATED_PAD | Freq: Every day | CUTANEOUS | Status: DC
  Administered 2021-04-28 – 2021-05-01 (×4): 6 via TOPICAL

## 2021-04-28 MED ORDER — SENNOSIDES-DOCUSATE SODIUM 8.6-50 MG PO TABS: 1.0000 | ORAL_TABLET | Freq: Every evening | ORAL | Status: DC | PRN

## 2021-04-28 MED ORDER — LIDOCAINE HCL (CARDIAC) PF 100 MG/5ML IV SOSY
PREFILLED_SYRINGE | INTRAVENOUS | Status: DC | PRN
  Administered 2021-04-28: 100 mg via INTRAVENOUS

## 2021-04-28 MED ORDER — SODIUM CHLORIDE 0.9 % IV SOLN
INTRAVENOUS | Status: DC | PRN
  Administered 2021-04-28: 50 ug/min via INTRAVENOUS

## 2021-04-28 MED ORDER — LIDOCAINE-EPINEPHRINE 1 %-1:100000 IJ SOLN
INTRAMUSCULAR | Status: DC | PRN
  Administered 2021-04-28: 3 mL

## 2021-04-28 MED ORDER — DEXAMETHASONE SODIUM PHOSPHATE 10 MG/ML IJ SOLN
INTRAMUSCULAR | Status: DC | PRN
  Administered 2021-04-28: 10 mg via INTRAVENOUS

## 2021-04-28 MED ORDER — REMIFENTANIL HCL 1 MG IV SOLR
INTRAVENOUS | Status: AC
  Filled 2021-04-28: qty 2000

## 2021-04-28 MED ORDER — HYDRALAZINE HCL 20 MG/ML IJ SOLN
10.0000 mg | INTRAMUSCULAR | Status: DC | PRN
  Administered 2021-04-28: 10 mg via INTRAVENOUS
  Filled 2021-04-28: qty 1

## 2021-04-28 MED ORDER — FENTANYL CITRATE (PF) 100 MCG/2ML IJ SOLN
INTRAMUSCULAR | Status: AC
  Filled 2021-04-28: qty 2

## 2021-04-28 MED ORDER — TOLVAPTAN 15 MG PO TABS
15.0000 mg | ORAL_TABLET | Freq: Once | ORAL | Status: AC
  Administered 2021-04-28: 15 mg via ORAL
  Filled 2021-04-28: qty 1

## 2021-04-28 MED ORDER — CONIVAPTAN LOADING DOSE: 20.0000 mg | Freq: Once | INTRAVENOUS | Status: DC

## 2021-04-28 MED ORDER — SODIUM CHLORIDE 3 % IV SOLN
INTRAVENOUS | Status: DC
  Filled 2021-04-28 (×3): qty 500

## 2021-04-28 MED ORDER — ONDANSETRON HCL 4 MG/2ML IJ SOLN
INTRAMUSCULAR | Status: DC | PRN
  Administered 2021-04-28: 4 mg via INTRAVENOUS

## 2021-04-28 MED ORDER — FENTANYL CITRATE (PF) 100 MCG/2ML IJ SOLN
INTRAMUSCULAR | Status: DC | PRN
  Administered 2021-04-28: 50 ug via INTRAVENOUS

## 2021-04-28 MED ORDER — BACITRACIN ZINC 500 UNIT/GM EX OINT
TOPICAL_OINTMENT | CUTANEOUS | Status: AC
  Filled 2021-04-28: qty 28.35

## 2021-04-28 MED ORDER — LACTATED RINGERS IV SOLN: INTRAVENOUS | Status: DC

## 2021-04-28 MED ORDER — ONDANSETRON HCL 4 MG/2ML IJ SOLN: 4.0000 mg | Freq: Once | INTRAMUSCULAR | Status: DC | PRN

## 2021-04-28 MED ORDER — ACETAMINOPHEN 325 MG PO TABS: 650.0000 mg | ORAL_TABLET | ORAL | Status: DC | PRN

## 2021-04-28 MED ORDER — HEMOSTATIC AGENTS (NO CHARGE) OPTIME
TOPICAL | Status: DC | PRN
  Administered 2021-04-28: 2 via TOPICAL
  Administered 2021-04-28: 1 via TOPICAL

## 2021-04-28 MED ORDER — CEFAZOLIN SODIUM-DEXTROSE 2-3 GM-%(50ML) IV SOLR
INTRAVENOUS | Status: DC | PRN
  Administered 2021-04-28: 2 g via INTRAVENOUS

## 2021-04-28 MED ORDER — DEXAMETHASONE SODIUM PHOSPHATE 10 MG/ML IJ SOLN
10.0000 mg | Freq: Four times a day (QID) | INTRAMUSCULAR | Status: DC
  Administered 2021-04-28 – 2021-04-30 (×7): 10 mg via INTRAVENOUS
  Filled 2021-04-28 (×9): qty 1

## 2021-04-28 MED ORDER — PANTOPRAZOLE SODIUM 40 MG IV SOLR
40.0000 mg | Freq: Every day | INTRAVENOUS | Status: DC
  Administered 2021-04-28 – 2021-05-01 (×4): 40 mg via INTRAVENOUS
  Filled 2021-04-28 (×4): qty 40

## 2021-04-28 MED ORDER — MANNITOL 20 % IV SOLN
75.0000 g | Freq: Once | Status: AC
  Administered 2021-04-28: 75 g via INTRAVENOUS
  Filled 2021-04-28: qty 375

## 2021-04-28 MED ORDER — POTASSIUM CHLORIDE IN NACL 20-0.9 MEQ/L-% IV SOLN
INTRAVENOUS | Status: DC
  Filled 2021-04-28 (×2): qty 1000

## 2021-04-28 MED ORDER — DEXAMETHASONE SODIUM PHOSPHATE 10 MG/ML IJ SOLN
6.0000 mg | Freq: Four times a day (QID) | INTRAMUSCULAR | Status: DC
  Administered 2021-04-28: 6 mg via INTRAVENOUS
  Filled 2021-04-28 (×4): qty 0.6

## 2021-04-28 MED ORDER — ROCURONIUM BROMIDE 100 MG/10ML IV SOLN
INTRAVENOUS | Status: DC | PRN
  Administered 2021-04-28: 20 mg via INTRAVENOUS
  Administered 2021-04-28: 50 mg via INTRAVENOUS

## 2021-04-28 MED ORDER — SUGAMMADEX SODIUM 500 MG/5ML IV SOLN
INTRAVENOUS | Status: DC | PRN
  Administered 2021-04-28: 200 mg via INTRAVENOUS

## 2021-04-28 MED ORDER — ONDANSETRON HCL 4 MG PO TABS: 4.0000 mg | ORAL_TABLET | ORAL | Status: DC | PRN

## 2021-04-28 MED ORDER — SODIUM CHLORIDE FLUSH 0.9 % IV SOLN
INTRAVENOUS | Status: AC
  Filled 2021-04-28: qty 10

## 2021-04-28 SURGICAL SUPPLY — 71 items
ADH LQ OCL WTPRF AMP STRL LF (MISCELLANEOUS) ×2
ADHESIVE MASTISOL STRL (MISCELLANEOUS) ×3 IMPLANT
AGENT HMST KT MTR STRL THRMB (HEMOSTASIS) ×4
AGENT HMST MTR 8 SURGIFLO (HEMOSTASIS)
APL PRP STRL LF ISPRP CHG 10.5 (MISCELLANEOUS) ×4
APPLICATOR CHLORAPREP 10.5 ORG (MISCELLANEOUS) ×6 IMPLANT
BASIN GRAD PLASTIC 32OZ STRL (MISCELLANEOUS) ×1 IMPLANT
BATTERY IQ STERILE (MISCELLANEOUS) IMPLANT
BLADE CLIPPER SPEC (BLADE) ×3 IMPLANT
BNDG GAUZE 4.5X4.1 6PLY STRL (MISCELLANEOUS) ×9 IMPLANT
BRUSH SCRUB EZ  4% CHG (MISCELLANEOUS)
BRUSH SCRUB EZ 4% CHG (MISCELLANEOUS) ×2 IMPLANT
BTRY SRG DRVR 1.5 IQ (MISCELLANEOUS)
BUR ACORN 7.5 PRECISION (BURR) ×3 IMPLANT
BUR NEURO DRILL SOFT 3.0X3.8M (BURR) ×2 IMPLANT
BUR SPIRAL ROUTER 2.3 (BUR) ×1 IMPLANT
CATH FOLEY 2WAY 12FR 5CC LATEX (CATHETERS) ×1 IMPLANT
CLIP VESOCCLUDE MED 6/CT (CLIP) ×2 IMPLANT
CNTNR SPEC 2.5X3XGRAD LEK (MISCELLANEOUS) ×4
CONT SPEC 4OZ STER OR WHT (MISCELLANEOUS) ×2
CONT SPEC 4OZ STRL OR WHT (MISCELLANEOUS) ×4
CONTAINER SPEC 2.5X3XGRAD LEK (MISCELLANEOUS) IMPLANT
COUNTER NEEDLE 20/40 LG (NEEDLE) ×5 IMPLANT
COVER WAND RF STERILE (DRAPES) ×3 IMPLANT
DRAPE MICROSCOPE SPINE 48X150 (DRAPES) IMPLANT
DRAPE POUCH INSTRU U-SHP 10X18 (DRAPES) ×3 IMPLANT
DRAPE SURG 17X11 SM STRL (DRAPES) ×9 IMPLANT
DRSG TEGADERM 4X4.75 (GAUZE/BANDAGES/DRESSINGS) ×3 IMPLANT
DRSG TELFA 4X3 1S NADH ST (GAUZE/BANDAGES/DRESSINGS) ×6 IMPLANT
ELECT CAUTERY BLADE TIP 2.5 (TIP) ×3
ELECT REM PT RETURN 9FT ADLT (ELECTROSURGICAL) ×3
ELECTRODE CAUTERY BLDE TIP 2.5 (TIP) ×2 IMPLANT
ELECTRODE REM PT RTRN 9FT ADLT (ELECTROSURGICAL) ×2 IMPLANT
GAUZE XEROFORM 1X8 LF (GAUZE/BANDAGES/DRESSINGS) ×3 IMPLANT
GLOVE SURG SYN 8.5  E (GLOVE) ×3
GLOVE SURG SYN 8.5 E (GLOVE) ×6 IMPLANT
GLOVE SURG SYN 8.5 PF PI (GLOVE) ×6 IMPLANT
GOWN SRG XL LVL 3 NONREINFORCE (GOWNS) ×2 IMPLANT
GOWN STRL NON-REIN TWL XL LVL3 (GOWNS) ×3
GRADUATE 1200CC STRL 31836 (MISCELLANEOUS) ×3 IMPLANT
HEMOSTAT SURGICEL 2X14 (HEMOSTASIS) ×5 IMPLANT
HOLDER FOLEY CATH W/STRAP (MISCELLANEOUS) ×1 IMPLANT
HOOK STAY BLUNT/RETRACTOR 5M (MISCELLANEOUS) ×1 IMPLANT
IV CATH ANGIO 14GX1.88 NO SAFE (IV SOLUTION) ×4 IMPLANT
KIT TURNOVER KIT A (KITS) ×3 IMPLANT
MANIFOLD NEPTUNE II (INSTRUMENTS) ×3 IMPLANT
MARKER SKIN DUAL TIP RULER LAB (MISCELLANEOUS) ×6 IMPLANT
NS IRRIG 1000ML POUR BTL (IV SOLUTION) ×6 IMPLANT
PACK CRANIOTOMY CUSTOM (CUSTOM PROCEDURE TRAY) ×3 IMPLANT
PAD ARMBOARD 7.5X6 YLW CONV (MISCELLANEOUS) ×9 IMPLANT
PATTIES SURGICAL .5 X.5 (GAUZE/BANDAGES/DRESSINGS) ×4 IMPLANT
PATTIES SURGICAL .5X1.5 (GAUZE/BANDAGES/DRESSINGS) ×4 IMPLANT
PIN MAYFIELD SKULL DISP (PIN) ×3 IMPLANT
PLATE 1.5/0.5 8HOLE SM SUB (Plate) ×1 IMPLANT
SCREW SELF DRILL HT 1.5/4MM (Screw) ×18 IMPLANT
SHEET NEURO XL SOL CTL (MISCELLANEOUS) ×3 IMPLANT
SPOGE SURGIFLO 8M (HEMOSTASIS)
SPONGE SURGIFLO 8M (HEMOSTASIS) ×6 IMPLANT
STAPLER SKIN PROX 35W (STAPLE) ×5 IMPLANT
SURGIFLO W/THROMBIN 8M KIT (HEMOSTASIS) ×2 IMPLANT
SUT BIOSYN 3-0 30 V-20 (SUTURE) ×2 IMPLANT
SUT MNCRL 3-0 UNDYED SH (SUTURE) IMPLANT
SUT MONOCRYL 3-0 UNDYED (SUTURE) ×1
SUT NURALON 4 0 TR CR/8 (SUTURE) ×5 IMPLANT
SUT POLYSORB 2-0 5X18 GS-10 (SUTURE) ×6 IMPLANT
SUT VIC AB 2-0 CT2 18 VCP726D (SUTURE) ×8 IMPLANT
TAPE CLOTH 3X10 WHT NS LF (GAUZE/BANDAGES/DRESSINGS) ×7 IMPLANT
TOWEL OR 17X26 4PK STRL BLUE (TOWEL DISPOSABLE) ×9 IMPLANT
TRAY FOLEY MTR SLVR 16FR STAT (SET/KITS/TRAYS/PACK) ×3 IMPLANT
TUBING CONNECTING 10 (TUBING) ×3 IMPLANT
WATER STERILE IRR 1000ML POUR (IV SOLUTION) ×2 IMPLANT

## 2021-04-28 NOTE — Anesthesia Preprocedure Evaluation (Addendum)
Anesthesia Evaluation  Patient identified by MRN, date of birth, ID band Patient confused  General Assessment Comment:Patient is aphasic  Reviewed: Allergy & Precautions, NPO status , Patient's Chart, lab work & pertinent test results  Airway Mallampati: II  TM Distance: >3 FB     Dental   Pulmonary neg pulmonary ROS,    Pulmonary exam normal        Cardiovascular negative cardio ROS Normal cardiovascular exam     Neuro/Psych Intracranial mass  Neuromuscular disease negative psych ROS   GI/Hepatic negative GI ROS, Neg liver ROS,   Endo/Other  negative endocrine ROS  Renal/GU negative Renal ROS  negative genitourinary   Musculoskeletal negative musculoskeletal ROS (+)   Abdominal Normal abdominal exam  (+)   Peds negative pediatric ROS (+)  Hematology negative hematology ROS (+)   Anesthesia Other Findings History reviewed. No pertinent past medical history.  Reproductive/Obstetrics                            Anesthesia Physical Anesthesia Plan  ASA: II  Anesthesia Plan: General   Post-op Pain Management:    Induction: Intravenous  PONV Risk Score and Plan:   Airway Management Planned:   Additional Equipment:   Intra-op Plan:   Post-operative Plan: Extubation in OR  Informed Consent: I have reviewed the patients History and Physical, chart, labs and discussed the procedure including the risks, benefits and alternatives for the proposed anesthesia with the patient or authorized representative who has indicated his/her understanding and acceptance.     Dental advisory given  Plan Discussed with: CRNA and Surgeon  Anesthesia Plan Comments:        Anesthesia Quick Evaluation

## 2021-04-28 NOTE — Progress Notes (Signed)
Dr. Orpah Melter notified of patients diastolic b/p.

## 2021-04-28 NOTE — Progress Notes (Signed)
Initial Nutrition Assessment  DOCUMENTATION CODES:  Not applicable  INTERVENTION:   Advance diet to regular as able after surgery  Add nutrition supplements as needed based on intake trends  Request new measured weight to establish baseline   NUTRITION DIAGNOSIS:  Inadequate oral intake related to decreased appetite,lethargy/confusion as evidenced by meal completion < 50%.  GOAL:  Patient will meet greater than or equal to 90% of their needs  MONITOR:  PO intake,Diet advancement  REASON FOR ASSESSMENT:  Malnutrition Screening Tool    ASSESSMENT:  Pt presented to ED with AMS and falls. CT and MRI done in ED and concerning for a brain neoplasm. 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 At baseline, lives at home with family who report she has been having falls and difficulty speaking since January.  Pt out of room for surgery at the time of assessment. Noted poor intake of meals prior to NPO status. Limited weight hx available to assess for changes or loss at this time. Will request new measured. Will monitor for diet advancement and add supplements if intake remains poor after surgery.   5/6 - Op, Left Temporal Craniotomy for Open Biopsy  Average Meal Intake: Marland Kitchen 5/4-5/6: 25% intake x 1 recorded meal  Relevant Scheduled Meds: . ondansetron (ZOFRAN) IV  4 mg Intravenous Q6H  . pantoprazole (PROTONIX) IV  40 mg Intravenous Q24H   Relevant Continuous Infusions: . sodium chloride 100 mL/hr at 04/27/21 1548  . lactated ringers 10 mL/hr at 04/28/21 0850   Relevant PRN Meds: magnesium hydroxide, ondansetron   NUTRITION - FOCUSED PHYSICAL EXAM: Defer to in-person assessment  Diet Order:   Diet Order            Diet NPO time specified  Diet effective midnight                EDUCATION NEEDS:  No education needs have been identified at this time  Skin:  Skin Assessment: Reviewed RN Assessment  Last BM:  unknown  Height:  Ht Readings from Last 1  Encounters:  04/26/21 5\' 5"  (1.651 m)    Weight:  Wt Readings from Last 1 Encounters:  04/26/21 59 kg    Ideal Body Weight:  56.8 kg  BMI:  Body mass index is 21.63 kg/m.  Estimated Nutritional Needs:   Kcal:  1600-1800 kcal/d  Protein:  80-90 kcal  Fluid:  >1832mL/d  Ranell Patrick, RD, LDN Clinical Dietitian Pager on [place]

## 2021-04-28 NOTE — Progress Notes (Signed)
Pt off floor for surgery at this time via hospital bed accompanied by transport. IVF running.

## 2021-04-28 NOTE — Consult Note (Addendum)
NAME:  Audrey Barron, MRN:  [id number], DOB:  [birth year], LOS: 1 ADMISSION DATE:  04/26/2021, CONSULTATION DATE:  04/28/2021 REFERRING MD: Elayne Guerin, MD  CHIEF COMPLAINT: Left Brain mass  History of present illness   70 y.o female with pertinent past medical history of ovarian cancer presenting to [facility] ED on  5/4 with c/o recurrent falls, unsteady gait, slurred speech, facial droop, and confusion. On review of her records, it appeared that she was recently evaluated by Neurology on 04/13/21 for similar symptoms. Per the Neurologist notes, symptoms of memory loss and confusion was first noticed in 12/2020 and has been progressive with associated falls, confusion, slurred speech and right sided facial droop?Marland Kitchen An MRI brain was ordered at that time but it is unclear if this was obtained until she presented to the ED with worsening symptoms and a fall that prompted the ED visit.  ED Course: On arrival to the ED, she was afebrile with blood pressure 129/89 mm Hg and pulse rate 71 beats/min. Patient was noted with significant right facial droop as well as right pronator drift on exam. She had difficulties with words and some slurring of speech. She was otherwise alert and oriented. Initial labs revealed low sodium 131  otherwise unremarkable CMP and CBC. A non contrast CT head was obtained which showed large L heterogeneously enhancing lesion, concerning for neoplasm. The differential includes glioblastoma and lymphoma. Follow up MRI Brain again demonstrated left basal ganglia mass extending into the left temporal , lobe with rightward midline shift measuring 71mm. Neurosurgery was consulted for management.  Hospital Course: During the course of hospitalization, patient was evaluated by Neurosurgery who recommended a CT CAP and open biopsy for further investigation. Patient was started on Keppra 500 mg BID and admitted to [facility] floor. PCCM consulted for post-craniotomy ICU monitoring.  Past Medical  History  Malignant neoplasm of ovary, unspecified laterality   Significant Hospital Events   05/04: Admitted to medsurg unit with left brain mass 05/06: To OR for Left craniotomy tumor excision 05/06: PCCM consulted  Consults:  PCCM Neurosurgery  Procedures:  5/06: Left craniotomy Tumor excision  Significant Diagnostic Tests:  05/04: MRI Brain >Multifocal abnormal contrast enhancement centered within the left basal ganglia and extending into the left temporal lobe, crossing the midline at the level of the fornix. 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 The lack of necrosis is not characteristic of glioblastoma, which remains the second consideration. 2. Rightward midline shift measuring 12 mm. 05/04: Noncontrast CT head>Large heterogeneous mass centered in the region of the left basal ganglia, with significant surrounding vasogenic edema and mass effect. MRI is recommended for further evaluation. 2. No acute infarct or hemorrhage 05/05: CT Chest, abdomen and pelvis>. No adenopathy in the chest, abdomen or pelvis. No signs of Neoplasm. 2. Mild gastric thickening versus under distension. Correlate with any symptoms of gastritis. 3. Engorgement of parametrial veins and LEFT ovarian vein, nonspecific but can be seen in the setting of pelvic congestion syndrome.  Micro Data:  05/04: SARS-CoV-2 PCR>> negative 05/04: Influenza PCR>> negative   Antimicrobials:    OBJECTIVE  Blood pressure (!) 156/107, pulse 69, temperature 98 F (36.7 C), temperature source Oral, resp. rate 20, height 5\' 5"  (1.651 m), weight 59 kg, SpO2 99 %.        Intake/Output Summary (Last 24 hours) at 04/28/2021 0818 Last data filed at 04/28/2021 0500 Gross per 24 hour  Intake 500 ml  Output 200 ml  Net 300 ml   [employer]  04/26/21 1752  Weight: 59 kg    Physical Examination  GENERAL:70 year-old patient lying in the bed with no acute distress.  EYES: Pupils equal, round, reactive to light and  accommodation. No scleral icterus. Extraocular muscles as described below HEENT: Head atraumatic, normocephalic. Oropharynx and nasopharynx clear.  NECK:  Supple, no jugular venous distention. No thyroid enlargement, no tenderness.  LUNGS: Normal breath sounds bilaterally, no wheezing, rales,rhonchi or crepitation. No use of accessory muscles of respiration.  CARDIOVASCULAR: S1, S2 normal. No murmurs, rubs, or gallops.  ABDOMEN: Soft, nontender, nondistended. Bowel sounds present. No organomegaly or mass.  EXTREMITIES: No pedal edema, cyanosis, or clubbing.  NEUROLOGIC: Patient lethargic and opens eyes to sternal rub only. Speech Dysarthric to mute . Unable to follow commands. Left gaze preference. Right homonomyous hemianopia. Left facial weakness/droop. Right upper extremity with 3/5 strength and able to localize pain. Right lower extremity with 3/5 strength and withdraws to noxious stimuli. Extends left arm. Unable to break gravity or localize pain.  Plantars down on the right, up on  the left. Sensation intact. Gait not checked.  PSYCHIATRIC: Unable to assess.  SKIN: No obvious rash, lesion, or ulcer.   Labs/imaging that I havepersonally reviewed  (right click and "Reselect all SmartList Selections" daily)     Labs   CBC: Recent Labs  Lab 04/26/21 1841 04/27/21 0312 04/28/21 0326  WBC 7.7 7.3 8.8  HGB 13.7 13.7 14.1  HCT 41.0 40.0 40.3  MCV 85.8 84.6 80.9  PLT 201 194 169    Basic Metabolic Panel: Recent Labs  Lab 04/26/21 1841 04/27/21 0312  NA 131* 131*  K 4.0 3.9  CL 97* 97*  CO2 27 27  GLUCOSE 102* 90  BUN 9 7*  CREATININE 0.62 0.53  CALCIUM 9.2 9.3   GFR: Estimated Creatinine Clearance: 58.9 mL/min (by C-G formula based on SCr of 0.53 mg/dL). Recent Labs  Lab 04/26/21 1841 04/27/21 0312 04/28/21 0326  WBC 7.7 7.3 8.8    Liver Function Tests: No results for input(s): AST, ALT, ALKPHOS, BILITOT, PROT, ALBUMIN in the last 168 hours. No results for  input(s): LIPASE, AMYLASE in the last 168 hours. No results for input(s): AMMONIA in the last 168 hours.  ABG No results found for: PHART, PCO2ART, PO2ART, HCO3, TCO2, ACIDBASEDEF, O2SAT   Coagulation Profile: No results for input(s): INR, PROTIME in the last 168 hours.  Cardiac Enzymes: No results for input(s): CKTOTAL, CKMB, CKMBINDEX, TROPONINI in the last 168 hours.  HbA1C: No results found for: HGBA1C  CBG: No results for input(s): GLUCAP in the last 168 hours.  Review of Systems:   UNABLE TO ASSESS DUE TO ALTERED MENTAL STATUS  Past Medical History  She,  has no past medical history on file.   Surgical History   History reviewed. No pertinent surgical history.   Social History   reports that she has never smoked. She does not have any smokeless tobacco history on file. She reports previous alcohol use. She reports previous drug use.   Family History   Her family history is not on file.   Allergies Not on File   Home Medications  Prior to Admission medications   Medication Sig Start Date End Date Taking? Authorizing Provider  aspirin 81 MG chewable tablet Chew 81 mg by mouth daily.   Yes [provider]  cyanocobalamin 1000 MCG tablet Take 1,000 mcg by mouth daily.   Yes [provider]   Scheduled Meds: . Chlorhexidine Gluconate Cloth  6  each Topical Daily  . levETIRAcetam  500 mg Oral BID  . ondansetron (ZOFRAN) IV  4 mg Intravenous Q6H  . pantoprazole (PROTONIX) IV  40 mg Intravenous Q24H  . sodium chloride flush       Continuous Infusions: . 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 S/p Left temporal Craniotomy for open biopsy POD # 0 - Supplemental O2 as needed to maintain O2 saturations > 92% - Continuous cardiac monitoring - Monitor Crani site for s/s of post-op infections -Trend,CBC,BMP, Monitor fever curve - Vital signs/blood pressure with neuro checks/NIHSS   - Seizure precautions - Continue Keppra 500 mg BID - Continue Dexamethasone - Keep BP <160/105 with short-acting antihypertensives (Labetolol, Hydralazine or Nicardipine Gtt as needed).  - Place SCDs for DVT prevention for now. 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 S/p Mannitol intra-op - Check cortisol, TSH, serum osmolality, sodium osmolality - Hold diurectics - Follow serial sodium    Altered Mental Status - secondary to brain mass as above - Provide supportive care - Avoid sedatives - Neuro checks as above     Best practice:  Diet:  NPO Pain/Anxiety/Delirium protocol (if indicated): Yes (RASS goal -1) VAP protocol (if indicated): Not indicated DVT prophylaxis: Contraindicated GI prophylaxis: PPI Glucose control:  SSI No Central venous access:  N/A Arterial line:  N/A Foley:  Yes, and it is still needed Mobility:  bed rest  PT consulted: N/A Last date of multidisciplinary goals of care discussion [04/28/2021] Code Status:  full code Disposition: stepdown  Critical care time: [place], DNP, CCRN, FNP-C, AGACNP-BC Acute Care Nurse Practitioner  [facility] Pager: [phone] [place] [facility]  .

## 2021-04-28 NOTE — OR Nursing (Deleted)
Plate explanted by Dr. Izora Ribas.  Serial number on plate [id number]. Plate discarded in biohazard bin per MD.

## 2021-04-28 NOTE — Progress Notes (Signed)
    Attending Progress Note  DOS: 04/28/2021 - L temporal craniotomy for biopsy  History: Audrey Barron is here for left-sided brain tumor.    POD0: Patient is still recovering in ICU.  She is moving purposefully.  She is not responding to verbal commands.  Physical Exam: Vitals:   04/28/21 1345 04/28/21 1400  BP: (!) 157/104 (!) 166/100  Pulse: 61 62  Resp: 17 16  Temp:    SpO2: 99% 100%   OE to stim Nonverbal - aphasic Facial grimace symmetric P 3 to 2 Blinks to threat  Localized briskly with LUE Withdraws other extremities briskly. She is moving all 4 limbs spontaneously and purposefully Data:  Recent Labs  Lab 04/26/21 1841 04/27/21 0312  NA 131* 131*  K 4.0 3.9  CL 97* 97*  CO2 27 27  BUN 9 7*  CREATININE 0.62 0.53  GLUCOSE 102* 90  CALCIUM 9.2 9.3   No results for input(s): AST, ALT, ALKPHOS in the last 168 hours.  Invalid input(s): TBILI   Recent Labs  Lab 04/26/21 1841 04/27/21 0312 04/28/21 0326  WBC 7.7 7.3 8.8  HGB 13.7 13.7 14.1  HCT 41.0 40.0 40.3  PLT 201 194 213   No results for input(s): APTT, INR in the last 168 hours.       Other tests/results: HCT pending  Assessment/Plan:  Audrey Barron is stable POD0 after biopsy  - neuro checks - SBP<160 - DVT prophylaxis tomorrow - Check HCT - Follow up pathology  Meade Maw MD, [facility] Department of Neurosurgery

## 2021-04-28 NOTE — Progress Notes (Signed)
Report given to [facility]. She is taking over patient care.

## 2021-04-28 NOTE — Progress Notes (Signed)
I spoke with daughter Anderson [place] [phone] and informed her that her mom is sleeping soundly and she will be going to the OR shortly

## 2021-04-28 NOTE — Progress Notes (Signed)
[employer] for 3% saline infusion and Tolvaptan   Indication: hyponatremia  Patient Measurements: Height: 5\' 5"  (165.1 cm) Weight: 59 kg (130 lb) IBW/kg (Calculated) : 57  Vital Signs: Temp: 97.4 F (36.3 C) (05/06 2000) Temp Source: Axillary (05/06 2000) BP: 134/88 (05/06 1930) Pulse Rate: 88 (05/06 2000) Intake/Output from previous day: 05/05 0701 - 05/06 0700 In: 500 [I.V.:500] Out: 200 [Urine:200] Intake/Output from this shift: Total I/O In: 674.8 [I.V.:198.9; IV Piggyback:475.9] Out: 200 [Urine:200]  Labs: Recent Labs    04/26/21 1841 04/27/21 0312 04/28/21 0326 04/28/21 1502  WBC 7.7 7.3 8.8  --   HGB 13.7 13.7 14.1  --   HCT 41.0 40.0 40.3  --   PLT 201 194 213  --   CREATININE 0.62 0.53 0.43* 0.46   Estimated Creatinine Clearance: 58.9 mL/min (by C-G formula based on SCr of 0.46 mg/dL).   Microbiology: Recent Results (from the past 720 hour(s))  Resp Panel by RT-PCR (Flu A&B, Covid) Nasopharyngeal Swab     Status: None   Collection Time: 04/26/21 10:10 PM   Specimen: Nasopharyngeal Swab; Nasopharyngeal(NP) swabs in vial transport medium  Result Value Ref Range Status   SARS Coronavirus 2 by RT PCR NEGATIVE NEGATIVE Final    Comment: (NOTE) SARS-CoV-2 target nucleic acids are NOT DETECTED.  The SARS-CoV-2 RNA is generally detectable in upper respiratory specimens during the acute phase of infection. The lowest concentration of SARS-CoV-2 viral copies this assay can detect is 138 copies/mL. A negative result does not preclude SARS-Cov-2 infection and should not be used as the sole basis for treatment or other patient management decisions. A negative result may occur with  improper specimen collection/handling, submission of specimen other than nasopharyngeal swab, presence of viral mutation(s) within the areas targeted by this assay, and inadequate number of viral copies(<138 copies/mL). A negative result must  be combined with clinical observations, patient history, and epidemiological information. The expected result is Negative.  Fact Sheet for Patients:  EntrepreneurPulse.com.au  Fact Sheet for Healthcare Providers:  IncredibleEmployment.be  This test is no t yet approved or cleared by the [place] FDA and  has been authorized for detection and/or diagnosis of SARS-CoV-2 by FDA under an Emergency Use Authorization (EUA). This EUA will remain  in effect (meaning this test can be used) for the duration of the COVID-19 declaration under Section 564(b)(1) of the Act, 21 U.S.C.section 360bbb-3(b)(1), unless the authorization is terminated  or revoked sooner.       Influenza A by PCR NEGATIVE NEGATIVE Final   Influenza B by PCR NEGATIVE NEGATIVE Final    Comment: (NOTE) The Xpert Xpress SARS-CoV-2/FLU/RSV plus assay is intended as an aid in the diagnosis of influenza from Nasopharyngeal swab specimens and should not be used as a sole basis for treatment. Nasal washings and aspirates are unacceptable for Xpert Xpress SARS-CoV-2/FLU/RSV testing.  Fact Sheet for Patients: EntrepreneurPulse.com.au  Fact Sheet for Healthcare Providers: IncredibleEmployment.be  This test is not yet approved or cleared by the [place] FDA and has been authorized for detection and/or diagnosis of SARS-CoV-2 by FDA under an Emergency Use Authorization (EUA). This EUA will remain in effect (meaning this test can be used) for the duration of the COVID-19 declaration under Section 564(b)(1) of the Act, 21 U.S.C. section 360bbb-3(b)(1), unless the authorization is terminated or revoked.  Performed at [facility], [address] [place], [place] [address]   MRSA PCR Screening     Status: None  Collection Time: 04/28/21  1:20 PM   Specimen: Nasal Mucosa; Nasopharyngeal  Result Value Ref Range Status   MRSA by PCR  NEGATIVE NEGATIVE Final    Comment:        The GeneXpert MRSA Assay (FDA approved for NASAL specimens only), is one component of a comprehensive MRSA colonization surveillance program. It is not intended to diagnose MRSA infection nor to guide or monitor treatment for MRSA infections. Performed at [facility], [address], [place], [place] [address]     Medical History: History reviewed. No pertinent past medical history.  Medications:  Scheduled:  . Chlorhexidine Gluconate Cloth  6 each Topical Daily  . dexamethasone (DECADRON) injection  10 mg Intravenous Q6H  . pantoprazole (PROTONIX) IV  40 mg Intravenous QHS  . tolvaptan  15 mg Oral Once    Assessment: 70 y.o. female with no known medical history as she has not followed with a physician for a long time, who presented to the emergency room with unsteady gait and recurrent falls as well as altered mental status with confusion.  She has hyponatremia and is being started on a hypertonic saline infusion. She is also being administered mannitol. Hypertonic saline is being started at 39 mL/hr.  5/6 @ 2023 Na 121- Per Provider 3% NaCl increased to 58mL/hr and patient to receive tolvaptan x 1 dose.   Goal of Therapy: Increase Na by 4-6 mEq/L in 4-6 hour  Plan:  Monitor sodium every 2 hours x 2 then every 4 hours  Notify MD for rate changes as appropriate  Call RN to stop infusion and notify MD if  Na increases by 4 mEq/L or more in the first 2 hours or  Na increases by 6 mEq/L or more in the first 4 hours   Per provider note- Goal Na 130-135 by 0500 am labs   Pernell Dupre, PharmD, BCPS Clinical Pharmacist 04/28/2021 9:08 PM

## 2021-04-28 NOTE — Interval H&P Note (Signed)
History and Physical Interval Note:  04/28/2021 8:05 AM  Audrey Barron  has presented today for surgery, with the diagnosis of left brain mass.  The various methods of treatment have been discussed with the patient and family. After consideration of risks, benefits and other options for treatment, the patient has consented to  Procedure(s): CRANIOTOMY TUMOR EXCISION (Left) APPLICATION OF CRANIAL NAVIGATION (N/A) as a surgical intervention.  The patient's history has been reviewed, patient examined, no change in status, stable for surgery.  I have reviewed the patient's chart and labs.  Questions were answered to the patient's satisfaction.     Chanise Habeck

## 2021-04-28 NOTE — Op Note (Signed)
Indications: The patient is a 70yo female who presented with a brain tumor.  Due to lack of other accessible sites for biopsy, open biopsy was recommended.  Findings: lymphoid tissue  Preoperative Diagnosis: brain tumor Postoperative Diagnosis: same   EBL: 10 ml IVF: see AR ml Drains: none Disposition: Extubated and Stable to PACU Complications: none  A foley catheter was placed.   Preoperative Note:   Risks of surgery discussed include: infection, bleeding, stroke, coma, death, paralysis, CSF leak, nerve/spinal cord injury, numbness, tingling, weakness, complex regional pain syndrome, recurrent stenosis and/or disc herniation, vascular injury, development of instability, neck/back pain, need for further surgery, persistent symptoms, development of deformity, and the risks of anesthesia. The patient understood these risks and agreed to proceed.  NAME OF PROCEDURE:               1. Left Temporal Craniotomy for Open Biopsy 2. Use of stereotaxis for planning  PROCEDURE:  Patient was brought to the operating room, intubated. The mayfield pins were applied.  The patient was then positioned for a left-sided temporal craniotomy.  The stereotactic images were registered to the patient's skin so that it could be used for planning.  Mannitol and keppra were given.  The incision was planned, then prepped and draped in standard fashion.  The incision was opened sharply, then the galea opened.  A retractor was placed.  The temporalis muscle was divided, then the periosteal used to reflect the muscle.  The craniotomy site was re-confirmed with stereotaxis.  A craniotomy was then fashioned with the burr and craniotome.  The dura was identified, then opened sharply.  The brain was visualized, and stereotactic guidance used to determine the cortical entry site.  The cortex was coagulated and entered.  The underlying abnormal tissue was biopsied, and sent for evaluation. We then achieved hemostasis in  the parenchyma, and waited for confirmation from pathology that abnormal tissue was found.  After receiving confirmation, the intradural area was irrigated and hemostasis confirmed. Dexamethasone was given after biopsy was taken.  We then turned attention to closure.  The craniotomy site was checked and a fixation plate used to reconstruct the skull.  The temporalis and galea were closed.  A running monocryl was used on the skin.  A sterile dressing was placed.    Needle, lap and all counts were correct at the end of the case.    A PA student Ardelle Park assisted in the entire procedure.  Meade Maw MD Neurosurgery

## 2021-04-28 NOTE — Progress Notes (Addendum)
Acute hyponatremia Patient's sodium has dropped back to 118.  Plan discussed with Dr. Orpah Melter during sign out if Na+ remained < 125: - Will increase rate of 3% NS to 60 mL/h - will add conivaptan infusion with load per order set - serum Na+ Q 2 - goal sodium 130-135 by 05:00 am labs   Addendum: informed by Pharmacist that conivaptan is unavailable in the [facility] health system. Discussed with Dr. Cari Caraway - place NGT - initiate tolvaptan in addition to the increase of 3 % NS   Venetia Night, AGACNP-BC Acute Care Nurse Practitioner [facility]   [phone] / [phone] Please see Amion for pager details.

## 2021-04-28 NOTE — Progress Notes (Addendum)
PROGRESS NOTE    Audrey Barron  [id number] DOB: 1951/05/20 DOA: 04/26/2021 PCP: [facility]   Chief Complaint  Patient presents with  . Fall  Brief Narrative: 70 year old female who has not seen a physician in a long time presented with unsteady gait, recurrent fall, altered mental status with confusion.  She was found outside of 4 home in her underpants.  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  Patient was admitted neurosurgery was consulted.  Subjective: Seen and examined this morning patient was sleeping did briefly wake up. Overnight no acute events. 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  CT chest abdomen pelvis no suspicious mass found.  Continue prophylactic Keppra.  Unsteady gait and altered mental status/acute encephalopathy  is likely due to #1.PT OT once able, TSH B12 stable, B1 pending.  Hyponatremia/hypochloremia:  Continue gentle IV fluid hydration.    Addendum: Patient had blood work repeated sodium came back at 118 there was pending a.m. blood work that also resulted late in the afternoon at 121.  CT head was repeated shows expected postbiopsy changes-Per neurosurgery significant swelling and given hyponatremia-trying full supportive measures with mannitol 3% saline.  I discussed with intensivist-patient has been transferred to ICU. Monitor closely.  Patient is at risk of decompensation. Further plan of care as per ICU.  Diet Order            Diet NPO time specified  Diet effective midnight                 Nutrition Problem:  Inadequate oral intake Etiology: decreased appetite,lethargy/confusion Signs/Symptoms: meal completion < 50% Interventions: Refer to RD note for recommendations Patient's Body mass index is 21.63 kg/m.  DVT prophylaxis: SCD.  No chemical prophylaxis due to brain biopsy, start chemical prophylaxis once okay with neurosurgery Code Status:   Code Status: Full Code  Family Communication: plan of care discussed with patient at bedside. I had updated patient's and daughter yesterday, spoke with ICU doctor and they have updated patient's family today.  Status is: Inpatient Remains inpatient appropriate because:Ongoing diagnostic testing needed not appropriate for outpatient work up, Unsafe d/c plan and Inpatient level of care appropriate due to severity of illness  Dispo: The patient is from: Home              Anticipated d/c is to: SNF              Patient currently is not medically stable to d/c.   Difficult to place patient No Unresulted Labs (From admission, onward)          Start     Ordered   04/28/21 [id number]  Basic metabolic panel  Tomorrow morning,   R        04/27/21 1405   04/27/21 1406  Vitamin B1  Add-on,   AD        04/27/21 1405   Signed and Held  Basic metabolic panel  Tomorrow morning,   R        Signed and Held   Signed and Held  CBC  Tomorrow morning,   R        Signed and Held  Medications reviewed:  Scheduled Meds: . Chlorhexidine Gluconate Cloth  6 each Topical Daily  . levETIRAcetam  500 mg Oral BID  . ondansetron (ZOFRAN) IV  4 mg Intravenous Q6H  . pantoprazole (PROTONIX) IV  40 mg Intravenous Q24H  . sodium chloride flush       Continuous Infusions: . sodium chloride 100 mL/hr at 04/27/21 1548    Consultants:see note  Procedures:see note  Antimicrobials: Anti-infectives (From admission, onward)   None     Culture/Microbiology No results found for: SDES, SPECREQUEST, CULT, REPTSTATUS  Other culture-see note  Objective: Vitals: Today's  Vitals   04/28/21 1145 04/28/21 1200 04/28/21 1215 04/28/21 1236  BP: 133/87 139/89 (!) 146/99 (!) 143/83  Pulse: 64 62 61 66  Resp: 16 17 17 18   Temp:   (!) 97.3 F (36.3 C) (!) 97.3 F (36.3 C)  TempSrc:      SpO2: 100% 100% 100% 100%  Weight:      Height:      PainSc: Asleep  Asleep     Intake/Output Summary (Last 24 hours) at 04/28/2021 1301 Last data filed at 04/28/2021 1215 Gross per 24 hour  Intake 1875 ml  Output 1035 ml  Net 840 ml   Filed Weights   04/26/21 1752  Weight: 59 kg   Weight change:   Intake/Output from previous day: 05/05 0701 - 05/06 0700 In: 500 [I.V.:500] Out: 200 [Urine:200] Intake/Output this shift: Total I/O In: 1375 [I.V.:1100; IV Piggyback:275] Out: 835 [Urine:800; Blood:35] Filed Weights   04/26/21 1752  Weight: 59 kg    Examination: General exam: Sleepy, not in distress,older than stated age, weak appearing. HEENT:Oral mucosa moist, Ear/Nose WNL grossly,dentition normal. Respiratory system: bilaterally diminished,no crackles,no use of accessory muscle, non tender. Cardiovascular system: S1 & S2 +,No JVD. Gastrointestinal system: Abdomen soft, NT,ND, BS+. Nervous System:Alert, awake, moving extremities Extremities: no edema, distal peripheral pulses palpable.  Skin: No rashes,no icterus. MSK: Normal muscle bulk,tone, power  Data Reviewed: I have personally reviewed following labs and imaging studies CBC: Recent Labs  Lab 04/26/21 1841 04/27/21 0312 04/28/21 0326  WBC 7.7 7.3 8.8  HGB 13.7 13.7 14.1  HCT 41.0 40.0 40.3  MCV 85.8 84.6 80.9  PLT 201 194 630   Basic Metabolic Panel: Recent Labs  Lab 04/26/21 1841 04/27/21 0312  NA 131* 131*  K 4.0 3.9  CL 97* 97*  CO2 27 27  GLUCOSE 102* 90  BUN 9 7*  CREATININE 0.62 0.53  CALCIUM 9.2 9.3   GFR: Estimated Creatinine Clearance: 58.9 mL/min (by C-G formula based on SCr of 0.53 mg/dL). Liver Function Tests: No results for input(s): AST, ALT, ALKPHOS, BILITOT, PROT,  ALBUMIN in the last 168 hours. No results for input(s): LIPASE, AMYLASE in the last 168 hours. No results for input(s): AMMONIA in the last 168 hours. Coagulation Profile: No results for input(s): INR, PROTIME in the last 168 hours. Cardiac Enzymes: No results for input(s): CKTOTAL, CKMB, CKMBINDEX, TROPONINI in the last 168 hours. BNP (last 3 results) No results for input(s): PROBNP in the last 8760 hours. HbA1C: No results for input(s): HGBA1C in the last 72 hours. CBG: Recent Labs  Lab 04/28/21 1235  GLUCAP 110*   Lipid Profile: No results for input(s): CHOL, HDL, LDLCALC, TRIG, CHOLHDL, LDLDIRECT in the last 72 hours. Thyroid Function Tests: Recent Labs    04/27/21 1456  TSH 0.411   Anemia Panel: Recent Labs    04/27/21 1457  VITAMINB12 958*   Sepsis Labs: No results  for input(s): PROCALCITON, LATICACIDVEN in the last 168 hours.  Recent Results (from the past 240 hour(s))  Resp Panel by RT-PCR (Flu A&B, Covid) Nasopharyngeal Swab     Status: None   Collection Time: 04/26/21 10:10 PM   Specimen: Nasopharyngeal Swab; Nasopharyngeal(NP) swabs in vial transport medium  Result Value Ref Range Status   SARS Coronavirus 2 by RT PCR NEGATIVE NEGATIVE Final    Comment: (NOTE) SARS-CoV-2 target nucleic acids are NOT DETECTED.  The SARS-CoV-2 RNA is generally detectable in upper respiratory specimens during the acute phase of infection. The lowest concentration of SARS-CoV-2 viral copies this assay can detect is 138 copies/mL. A negative result does not preclude SARS-Cov-2 infection and should not be used as the sole basis for treatment or other patient management decisions. A negative result may occur with  improper specimen collection/handling, submission of specimen other than nasopharyngeal swab, presence of viral mutation(s) within the areas targeted by this assay, and inadequate number of viral copies(<138 copies/mL). A negative result must be combined  with clinical observations, patient history, and epidemiological information. The expected result is Negative.  Fact Sheet for Patients:  EntrepreneurPulse.com.au  Fact Sheet for Healthcare Providers:  IncredibleEmployment.be  This test is no t yet approved or cleared by the [place] FDA and  has been authorized for detection and/or diagnosis of SARS-CoV-2 by FDA under an Emergency Use Authorization (EUA). This EUA will remain  in effect (meaning this test can be used) for the duration of the COVID-19 declaration under Section 564(b)(1) of the Act, 21 U.S.C.section 360bbb-3(b)(1), unless the authorization is terminated  or revoked sooner.       Influenza A by PCR NEGATIVE NEGATIVE Final   Influenza B by PCR NEGATIVE NEGATIVE Final    Comment: (NOTE) The Xpert Xpress SARS-CoV-2/FLU/RSV plus assay is intended as an aid in the diagnosis of influenza from Nasopharyngeal swab specimens and should not be used as a sole basis for treatment. Nasal washings and aspirates are unacceptable for Xpert Xpress SARS-CoV-2/FLU/RSV testing.  Fact Sheet for Patients: EntrepreneurPulse.com.au  Fact Sheet for Healthcare Providers: IncredibleEmployment.be  This test is not yet approved or cleared by the [place] FDA and has been authorized for detection and/or diagnosis of SARS-CoV-2 by FDA under an Emergency Use Authorization (EUA). This EUA will remain in effect (meaning this test can be used) for the duration of the COVID-19 declaration under Section 564(b)(1) of the Act, 21 U.S.C. section 360bbb-3(b)(1), unless the authorization is terminated or revoked.  Performed at [facility], [address] [place], [place] [address]      Radiology Studies: CT Head Wo Contrast  Result Date: 04/26/2021 CLINICAL DATA:  Disoriented, multiple falls, facial droop and weakness from previous stroke EXAM: CT HEAD  WITHOUT CONTRAST TECHNIQUE: Contiguous axial images were obtained from the base of the skull through the vertex without intravenous contrast. COMPARISON:  None. FINDINGS: Brain: There is a large mass centered in the region of the left basal ganglia, measuring approximately 4.3 x 3.5 cm. Marked surrounding vasogenic edema throughout the left cerebral hemisphere, with marked mass effect and rightward midline shift measuring approximately 14 mm at the level of the septum pellucidum. There is effacement the basilar cisterns. MRI is recommended for further evaluation. No acute hemorrhage. Marked effacement of the lateral ventricles by the mass effect described above. No acute extra-axial fluid collections. Vascular: No hyperdense vessel or unexpected calcification. Skull: Normal. Negative for fracture or focal lesion. Sinuses/Orbits: No acute finding. Other: None. IMPRESSION: 1.  Large heterogeneous mass centered in the region of the left basal ganglia, with significant surrounding vasogenic edema and mass effect. MRI is recommended for further evaluation. 2. No acute infarct or hemorrhage. Critical Value/emergent results were called by telephone at the time of interpretation on 04/26/2021 at 11:15 pm to provider [facility] , who verbally acknowledged these results. Electronically Signed   By: Randa Ngo M.D.   On: 04/26/2021 23:15   MR Brain W and Wo Contrast  Result Date: 04/27/2021 CLINICAL DATA:  Brain mass EXAM: MRI HEAD WITHOUT AND WITH CONTRAST TECHNIQUE: Multiplanar, multiecho pulse sequences of the brain and surrounding structures were obtained without and with intravenous contrast. CONTRAST:  4mL GADAVIST GADOBUTROL 1 MMOL/ML IV SOLN COMPARISON:  Head CT same day FINDINGS: Brain: There is multifocal abnormal contrast enhancement centered within the left basal ganglia and extending into the left temporal lobe and crossing the midline at the level of the fornix. There is a large amount of surrounding  hyperintense T2-weighted signal. At the level of the foramina of [place], there is rightward midline shift measuring 12 mm. The contrast enhancement pattern is predominantly solid. Vascular: Major flow voids are preserved. Skull and upper cervical spine: Normal calvarium and skull base. Visualized upper cervical spine and soft tissues are normal. Sinuses/Orbits:No paranasal sinus fluid levels or advanced mucosal thickening. No mastoid or middle ear effusion. Normal orbits. IMPRESSION: 1. Multifocal abnormal contrast enhancement centered within the left basal ganglia and extending into the left temporal lobe, crossing the midline at the level of the fornix. 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 The lack of necrosis is not characteristic of glioblastoma, which remains the second consideration. 2. Rightward midline shift measuring 12 mm. Electronically Signed   By: Ulyses Jarred M.D.   On: 04/27/2021 00:37   CT CHEST ABDOMEN PELVIS W CONTRAST  Result Date: 04/27/2021 CLINICAL DATA:  Metastatic disease evaluation, history of possible lymphoma in a 70 year old female found to have abnormality on brain imaging. EXAM: CT CHEST, ABDOMEN, AND PELVIS WITH CONTRAST TECHNIQUE: Multidetector CT imaging of the chest, abdomen and pelvis was performed following the standard protocol during bolus administration of intravenous contrast. CONTRAST:  35mL OMNIPAQUE IOHEXOL 300 MG/ML  SOLN COMPARISON:  Only brain imaging is available for comparison. FINDINGS: CT CHEST FINDINGS Cardiovascular: Scattered aortic atherosclerosis. No aneurysmal dilation of the thoracic aorta. Heart size normal without pericardial effusion. Central pulmonary vasculature is normal caliber. Mediastinum/Nodes: Esophagus mildly patulous. No thoracic inlet lymphadenopathy. No axillary lymphadenopathy. No hilar or mediastinal lymphadenopathy. Lungs/Pleura: No consolidation or pleural effusion. Atelectasis at the lung bases. Airways are patent. No suspicious mass  or nodule. Musculoskeletal: See below for full musculoskeletal details. CT ABDOMEN PELVIS FINDINGS Hepatobiliary: Low-density lesions in the liver, for instance on image 52 of series 2, well-circumscribed area in the RIGHT hepatic lobe measuring approximately 11 mm and another subcentimeter area in the anterior LEFT hepatic lobe, an additional 1.7 cm lesion in the posterior RIGHT hepatic lobe clearly with water density and well-circumscribed margins. These are compatible with cysts. No pericholecystic stranding. The portal vein is patent. No biliary duct dilation. Pancreas: Normal, without mass, inflammation or ductal dilatation. Spleen: Normal spleen. Adrenals/Urinary Tract: Adrenal glands are normal. Symmetric renal enhancement. No suspicious renal lesion. No hydronephrosis. The urinary bladder is under distended limiting assessment. Stomach/Bowel: Mild gastric thickening versus under distension. Small bowel is normal caliber without signs of adjacent inflammation or visible lesion, limited assessment due to lack of contrast. No signs of colonic obstruction or acute colonic  inflammation. No pericecal stranding. Appendix is normal. Vascular/Lymphatic: Tortuous nonaneurysmal abdominal aorta with mild atherosclerotic plaque. Mildly dilated LEFT common iliac artery up to 1.5 cm also tortuous. Smooth contour of the IVC. There is no gastrohepatic or hepatoduodenal ligament lymphadenopathy. No retroperitoneal or mesenteric lymphadenopathy. Engorgement of LEFT ovarian vein. No pelvic sidewall lymphadenopathy. Reproductive: Engorgement of parametrial veins and LEFT ovarian vein as described. LEFT renal vein is patent. Other: No ascites. Musculoskeletal: No acute bone finding. No destructive bone process. Spinal degenerative changes. IMPRESSION: 1. No adenopathy in the chest, abdomen or pelvis. No signs of neoplasm. 2. Mild gastric thickening versus under distension. Correlate with any symptoms of gastritis. 3. Engorgement  of parametrial veins and LEFT ovarian vein, nonspecific but can be seen in the setting of pelvic congestion syndrome. 4. Aortic atherosclerosis. Aortic Atherosclerosis (ICD10-I70.0) and Emphysema (ICD10-J43.9). Electronically Signed   By: Zetta Bills M.D.   On: 04/27/2021 10:18     LOS: 1 day   Antonieta Pert, MD [facility]  04/28/2021, 1:01 PM

## 2021-04-28 NOTE — Transfer of Care (Signed)
Immediate Anesthesia Transfer of Care Note  Patient: Audrey Barron  Procedure(s) Performed: CRANIOTOMY TUMOR EXCISION (Left ) APPLICATION OF CRANIAL NAVIGATION (N/A )  Patient Location: PACU  Anesthesia Type:General  Level of Consciousness: sedated, unresponsive and responds to stimulation  Airway & Oxygen Therapy: Patient Spontanous Breathing and Patient connected to face mask oxygen  Post-op Assessment: Report given to RN and Post -op Vital signs reviewed and stable  Post vital signs: Reviewed and stable  Last Vitals:  Vitals Value Taken Time  BP 112/78 04/28/21 1130  Temp 36.1 C 04/28/21 1124  Pulse 70 04/28/21 1133  Resp 31 04/28/21 1133  SpO2 100 % 04/28/21 1133  Vitals shown include unvalidated device data.  Last Pain:  Vitals:   04/28/21 1124  TempSrc:   PainSc: Asleep         Complications: No complications documented.

## 2021-04-28 NOTE — Progress Notes (Signed)
CT reviewed - no hemorrhage but with significant swelling.  Labs reviewed - sodium 118.  Discussed with ICU.  Giving mannitol now, then will start 3%. May need ongoing mannitol to stabilize hyponatremia  Will follow serious labs.

## 2021-04-28 NOTE — Progress Notes (Addendum)
1700 Unable to follow commands. Spontaneously moves left side and slightly moves right side. Non verbal and does not attempt to communicate. Daughter and brother at bedside. Opens and closes eyes but not to stimulation.

## 2021-04-28 NOTE — Interval H&P Note (Signed)
Heart sounds normal no MRG. Chest Clear to Auscultation Bilaterally.    

## 2021-04-28 NOTE — Anesthesia Procedure Notes (Signed)
Procedure Name: Intubation Date/Time: 04/28/2021 9:25 AM Performed by: Nelda Marseille, CRNA Pre-anesthesia Checklist: Patient identified, Patient being monitored, Timeout performed, Emergency Drugs available and Suction available Patient Re-evaluated:Patient Re-evaluated prior to induction Oxygen Delivery Method: Circle system utilized Preoxygenation: Pre-oxygenation with 100% oxygen Induction Type: IV induction Ventilation: Mask ventilation without difficulty Laryngoscope Size: Mac, 3 and McGraph Grade View: Grade I Tube type: Oral Tube size: 7.0 mm Number of attempts: 1 Airway Equipment and Method: Stylet and Video-laryngoscopy Placement Confirmation: ETT inserted through vocal cords under direct vision,  positive ETCO2 and breath sounds checked- equal and bilateral Secured at: 21 cm Tube secured with: Tape Dental Injury: Teeth and Oropharynx as per pre-operative assessment

## 2021-04-28 NOTE — Progress Notes (Addendum)
S: Patient with significant drop in serum sodium from 131>121>118 in the past 24 hours.  O: On bedside assessment, she was afebrile with blood pressure 133/83 mm Hg and pulse rate 73 beats/min. There were no new focal neurological deficits from previous assessment. Neuro exam continues to wax and wane. Initially was noted with left sided weakness but on repeat exam now moving all extremities except the right upper extremity.  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

## 2021-04-28 NOTE — Progress Notes (Signed)
[place] for 3% saline infusion Indication: hyponatremia  Patient Measurements: Height: 5\' 5"  (165.1 cm) Weight: 59 kg (130 lb) IBW/kg (Calculated) : 57  Vital Signs: Temp: 97.3 F (36.3 C) (05/06 1236) Temp Source: Temporal (05/06 0841) BP: 134/88 (05/06 1500) Pulse Rate: 69 (05/06 1500) Intake/Output from previous day: 05/05 0701 - 05/06 0700 In: 500 [I.V.:500] Out: 200 [Urine:200] Intake/Output from this shift: Total I/O In: 1375 [I.V.:1100; IV Piggyback:275] Out: 835 [Urine:800; Blood:35]  Labs: Recent Labs    04/26/21 1841 04/27/21 0312 04/28/21 0326 04/28/21 1502  WBC 7.7 7.3 8.8  --   HGB 13.7 13.7 14.1  --   HCT 41.0 40.0 40.3  --   PLT 201 194 213  --   CREATININE 0.62 0.53 0.43* 0.46   Estimated Creatinine Clearance: 58.9 mL/min (by C-G formula based on SCr of 0.46 mg/dL).   Microbiology: Recent Results (from the past 720 hour(s))  Resp Panel by RT-PCR (Flu A&B, Covid) Nasopharyngeal Swab     Status: None   Collection Time: 04/26/21 10:10 PM   Specimen: Nasopharyngeal Swab; Nasopharyngeal(NP) swabs in vial transport medium  Result Value Ref Range Status   SARS Coronavirus 2 by RT PCR NEGATIVE NEGATIVE Final    Comment: (NOTE) SARS-CoV-2 target nucleic acids are NOT DETECTED.  The SARS-CoV-2 RNA is generally detectable in upper respiratory specimens during the acute phase of infection. The lowest concentration of SARS-CoV-2 viral copies this assay can detect is 138 copies/mL. A negative result does not preclude SARS-Cov-2 infection and should not be used as the sole basis for treatment or other patient management decisions. A negative result may occur with  improper specimen collection/handling, submission of specimen other than nasopharyngeal swab, presence of viral mutation(s) within the areas targeted by this assay, and inadequate number of viral copies(<138 copies/mL). A negative result must be combined  with clinical observations, patient history, and epidemiological information. The expected result is Negative.  Fact Sheet for Patients:  EntrepreneurPulse.com.au  Fact Sheet for Healthcare Providers:  IncredibleEmployment.be  This test is no t yet approved or cleared by the [place] FDA and  has been authorized for detection and/or diagnosis of SARS-CoV-2 by FDA under an Emergency Use Authorization (EUA). This EUA will remain  in effect (meaning this test can be used) for the duration of the COVID-19 declaration under Section 564(b)(1) of the Act, 21 U.S.C.section 360bbb-3(b)(1), unless the authorization is terminated  or revoked sooner.       Influenza A by PCR NEGATIVE NEGATIVE Final   Influenza B by PCR NEGATIVE NEGATIVE Final    Comment: (NOTE) The Xpert Xpress SARS-CoV-2/FLU/RSV plus assay is intended as an aid in the diagnosis of influenza from Nasopharyngeal swab specimens and should not be used as a sole basis for treatment. Nasal washings and aspirates are unacceptable for Xpert Xpress SARS-CoV-2/FLU/RSV testing.  Fact Sheet for Patients: EntrepreneurPulse.com.au  Fact Sheet for Healthcare Providers: IncredibleEmployment.be  This test is not yet approved or cleared by the [place] FDA and has been authorized for detection and/or diagnosis of SARS-CoV-2 by FDA under an Emergency Use Authorization (EUA). This EUA will remain in effect (meaning this test can be used) for the duration of the COVID-19 declaration under Section 564(b)(1) of the Act, 21 U.S.C. section 360bbb-3(b)(1), unless the authorization is terminated or revoked.  Performed at [facility], [address], [place], [place] [address]   MRSA PCR Screening     Status: None   Collection  Time: 04/28/21  1:20 PM   Specimen: Nasal Mucosa; Nasopharyngeal  Result Value Ref Range Status   MRSA by PCR NEGATIVE  NEGATIVE Final    Comment:        The GeneXpert MRSA Assay (FDA approved for NASAL specimens only), is one component of a comprehensive MRSA colonization surveillance program. It is not intended to diagnose MRSA infection nor to guide or monitor treatment for MRSA infections. Performed at [facility], [address], [place], [place] [address]     Medical History: History reviewed. No pertinent past medical history.  Medications:  Scheduled:  . Chlorhexidine Gluconate Cloth  6 each Topical Daily  . dexamethasone (DECADRON) injection  6 mg Intravenous Q6H   Followed by  . [START ON 04/29/2021] dexamethasone (DECADRON) injection  4 mg Intravenous Q6H   Followed by  . [START ON 04/30/2021] dexamethasone (DECADRON) injection  4 mg Intravenous Q8H  . pantoprazole (PROTONIX) IV  40 mg Intravenous QHS  . sodium chloride flush        Assessment: 70 y.o. female with no known medical history as she has not followed with a physician for a long time, who presented to the emergency room with a [name] of unsteady gait and recurrent falls as well as altered mental status with confusion.  She has hyponatremia and is being started on a hypertonic saline infusion. She is also being administered mannitol. Hypertonic saline is being started at 39 mL/hr  Goal of Therapy: Increase Na by 4-6 mEq/L in 4-6 hour  Plan:  Monitor sodium every 2 hours x 2 then every 4 hours  Notify MD for rate changes as appropriate  Call RN to stop infusion and notify MD if  Na increases by 4 mEq/L or more in the first 2 hours or  Na increases by 6 mEq/L or more in the first 4 hours  Dallie Piles 04/28/2021,4:25 PM

## 2021-04-29 DIAGNOSIS — E871 Hypo-osmolality and hyponatremia: Secondary | ICD-10-CM

## 2021-04-29 DIAGNOSIS — G9389 Other specified disorders of brain: Secondary | ICD-10-CM

## 2021-04-29 LAB — MAGNESIUM: Magnesium: 2.4 mg/dL (ref 1.7–2.4)

## 2021-04-29 LAB — SODIUM
Sodium: 132 mmol/L — ABNORMAL LOW (ref 135–145)
Sodium: 135 mmol/L (ref 135–145)
Sodium: 138 mmol/L (ref 135–145)
Sodium: 139 mmol/L (ref 135–145)
Sodium: 142 mmol/L (ref 135–145)
Sodium: 143 mmol/L (ref 135–145)
Sodium: 141 mmol/L (ref 135–145)
Sodium: 144 mmol/L (ref 135–145)

## 2021-04-29 LAB — PHOSPHORUS: Phosphorus: 3.9 mg/dL (ref 2.5–4.6)

## 2021-04-29 LAB — GLUCOSE, CAPILLARY: Glucose-Capillary: 118 mg/dL — ABNORMAL HIGH (ref 70–99)

## 2021-04-29 MED ORDER — LACTATED RINGERS IV BOLUS
1000.0000 mL | Freq: Once | INTRAVENOUS | Status: AC
  Administered 2021-04-29: 1000 mL via INTRAVENOUS

## 2021-04-29 MED ORDER — ENOXAPARIN SODIUM 40 MG/0.4ML IJ SOSY
40.0000 mg | PREFILLED_SYRINGE | INTRAMUSCULAR | Status: DC
  Administered 2021-04-29 – 2021-05-02 (×4): 40 mg via SUBCUTANEOUS
  Filled 2021-04-29 (×4): qty 0.4

## 2021-04-29 NOTE — Anesthesia Postprocedure Evaluation (Signed)
Anesthesia Post Note  Patient: Audrey Barron  Procedure(s) Performed: CRANIOTOMY TUMOR EXCISION (Left ) APPLICATION OF CRANIAL NAVIGATION (N/A )  Patient location during evaluation: ICU Anesthesia Type: General Level of consciousness: awake Pain management: pain level controlled Vital Signs Assessment: post-procedure vital signs reviewed and stable Respiratory status: spontaneous breathing Cardiovascular status: blood pressure returned to baseline Anesthetic complications: no Comments: Hyponatremia treated.   No complications documented.   Last Vitals:  Vitals:   04/29/21 1730 04/29/21 1745  BP: (!) 83/64 (!) 80/59  Pulse: 87 85  Resp: 18 17  Temp:    SpO2: 96% 96%    Last Pain:  Vitals:   04/29/21 1600  TempSrc: Oral  PainSc:                  Korrin Waterfield

## 2021-04-29 NOTE — Progress Notes (Signed)
NAME:  Audrey Barron, MRN:  [id number], DOB:  11-21-1951, LOS: 2 ADMISSION DATE:  04/26/2021, CONSULTATION DATE:  04/28/2021 REFERRING MD: Elayne Guerin MD, CHIEF COMPLAINT: left brain mass  History of Present Illness:  70 year old female with history of ovarian cancer presented to the hospital on May 4 with several months of progressive neurologic decline.  On admission to the hospital she had facial droop and slurred speech.  Imaging of the brain revealed a large left heterogeneously enhancing lesion concerning for tumor.  Neurosurgery was consulted.  On 5/6 she underwent craniotomy with biopsy of the tumor lesion.  Preliminary path suggesting possible lymphoma.  Pertinent  Medical History  Ovarian cancer  Significant Hospital Events: Including procedures, antibiotic start and stop dates in addition to other pertinent events   05/04: Admitted to medsurg unit with left brain mass 05/06: To OR for Left craniotomy tumor excision 05/06: PCCM consulted 05/06: Significant hyponatremia improved  Interim History / Subjective:  No acute events overnight.  Neurosurgeon reports he feels her exam is improved today as she is able to clearly speak some words and is a bit more arousable.  Objective   Blood pressure (!) 84/67, pulse (!) 104, temperature 97.6 F (36.4 C), temperature source Oral, resp. rate 17, height 5\' 5"  (1.651 m), weight 59 kg, SpO2 97 %.        Intake/Output Summary (Last 24 hours) at 04/29/2021 1032 Last data filed at 04/29/2021 2130 Gross per 24 hour  Intake 2238.68 ml  Output 5785 ml  Net -3546.32 ml   Filed Weights   04/26/21 1752  Weight: 59 kg    Examination: General: Laying in bed with eyes closed but will arouse to stimuli and is able to speak some words HENT: Surgical site is clean and dry with dressing in place Lungs: Clear to auscultation bilaterally Cardiovascular: Regular rate and rhythm without murmur rub or gallop Abdomen: Soft; nontender;  nondistended Extremities: No clubbing cyanosis or edema Neuro: Will arouse to verbal stimuli; aphasic; slight right facial weakness; appears to be able to move all extremities GU: Furred  Labs/imaging that I havepersonally reviewed  (right click and "Reselect all SmartList Selections" daily)  Sodium is [facility] Problem list   n/a  Assessment & Plan:  Left brain mass status postcraniotomy and biopsy.  Postop day #1. 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 Status post mannitol, 3% saline, and tolvaptan. -Most recent sodium has normalized at 139. -Continue to follow sodium every 2 hours  Best practice (right click and "Reselect all SmartList Selections" daily)  Diet:  NPO Pain/Anxiety/Delirium protocol (if indicated): No VAP protocol (if indicated): Not indicated DVT prophylaxis: SCD GI prophylaxis: PPI Glucose control:  SSI No Central venous access:  N/A Arterial line:  N/A Foley:  N/A Mobility:  bed rest  PT consulted: Yes Code Status:  full code Disposition: ICU  Labs   CBC: Recent Labs  Lab 04/26/21 1841 04/27/21 0312 04/28/21 0326  WBC 7.7 7.3 8.8  HGB 13.7 13.7 14.1  HCT 41.0 40.0 40.3  MCV 85.8 84.6 80.9  PLT 201 194 865    Basic Metabolic Panel: Recent Labs  Lab 04/26/21 1841 04/27/21 0312 04/28/21 0326 04/28/21 1502 04/28/21 1643 04/28/21 2245 04/29/21 0108 04/29/21 0257 04/29/21 0513 04/29/21 0843  NA 131* 131* 121* 118*   < > 123* 132* 135 138 139  K 4.0 3.9 4.0 3.7  --   --   --   --   --   --   CL 97* 97* 90* 89*  --   --   --   --   --   --   CO2 27 27 20* 18*  --   --   --   --   --   --   GLUCOSE 102* 90 101* 150*  --   --   --   --   --   --   BUN 9 7* 6* 7*  --   --   --   --   --   --   CREATININE 0.62 0.53 0.43* 0.46  --    --   --   --   --   --   CALCIUM 9.2 9.3 9.1 8.8*  --   --   --   --   --   --   MG  --   --   --   --   --   --   --   --  2.4  --   PHOS  --   --   --   --   --   --   --   --  3.9  --    < > = values in this interval not displayed.   GFR: Estimated Creatinine Clearance: 58.9 mL/min (by C-G formula based on SCr of 0.46 mg/dL). Recent Labs  Lab 04/26/21 1841 04/27/21 0312 04/28/21 0326  WBC 7.7 7.3 8.8    Liver Function Tests: Recent Labs  Lab 04/28/21 2245  AST 19  ALT 18  ALKPHOS 25*  BILITOT 1.2  PROT 6.9  ALBUMIN 3.8   No results for input(s): LIPASE, AMYLASE in the last 168 hours. No results for input(s): AMMONIA in the last 168 hours.  ABG No results found for: PHART, PCO2ART, PO2ART, HCO3, TCO2, ACIDBASEDEF, O2SAT   Coagulation Profile: No results for input(s): INR, PROTIME in the last 168 hours.  Cardiac Enzymes: No results for input(s): CKTOTAL, CKMB, CKMBINDEX, TROPONINI in the last 168 hours.  HbA1C: No results found for: HGBA1C  CBG: Recent Labs  Lab 04/28/21 1235 04/28/21 2254  GLUCAP 110* 142*    Review of Systems:   Unable to obtain  Past Medical History:  She,  has no past medical history on file.   Surgical History:  History reviewed. No pertinent surgical history.   Social History:   reports that she has never smoked. She does not have any smokeless tobacco history on file. She reports previous alcohol use. She reports previous drug use.   Family History:  Her family history is not on file.   Allergies Not on File   Home Medications  Prior to Admission medications   Medication Sig Start Date End Date Taking? Authorizing Provider  aspirin 81 MG chewable tablet Chew 81 mg by mouth daily.   Yes [provider]  cyanocobalamin 1000 MCG tablet Take 1,000 mcg by mouth daily.   Yes [provider]

## 2021-04-29 NOTE — Progress Notes (Addendum)
    Attending Progress Note  DOS: 04/28/2021 - L temporal craniotomy for biopsy  History: Ed Rayson is here for left-sided brain tumor.    POD1: Patient has made improvements overnight.  She is now interacting somewhat with staff. 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  After giving mannitol, 3% sodium, and tolvaptan, she has had a significant rise in her serum sodium, and subsequent improvement in neurological condition.   POD0: Patient is still recovering in ICU.  She is moving purposefully.  She is not responding to verbal commands.  Physical Exam: Vitals:   04/29/21 0745 04/29/21 0800  BP: (!) 88/68 (!) 86/67  Pulse: 100 100  Resp: 17 17  Temp:    SpO2: 97% 95%   OE spontaneously She is now oriented to self and hospital with choices.  She continues to have significant global aphasia Nonverbal. Facial grimace with slight R weakness P 3 to 2 EOMI Sticks out tongue for nurse  Moving all 4 spontaneously and purposefully, but not following commands due to aphasia.   Data:  Recent Labs  Lab 04/27/21 0312 04/28/21 0326 04/28/21 1502 04/28/21 1643 04/29/21 0513  NA 131* 121* 118*   < > 138  K 3.9 4.0 3.7  --   --   CL 97* 90* 89*  --   --   CO2 27 20* 18*  --   --   BUN 7* 6* 7*  --   --   CREATININE 0.53 0.43* 0.46  --   --   GLUCOSE 90 101* 150*  --   --   CALCIUM 9.3 9.1 8.8*  --   --    < > = values in this interval not displayed.   Recent Labs  Lab 04/28/21 2245  AST 19  ALT 18  ALKPHOS 25*     Recent Labs  Lab 04/26/21 1841 04/27/21 0312 04/28/21 0326  WBC 7.7 7.3 8.8  HGB 13.7 13.7 14.1  HCT 41.0 40.0 40.3  PLT 201 194 213   No results for input(s): APTT, INR in the last 168 hours.      q2 hour sodiums show progressive improvement overnight  Other tests/results: HCT with significant swelling but no hemorrhage  Assessment/Plan:  Deseri Loss is improving POD1 after biopsy. She showed a precipitous  drop in sodium yesterday from SIADH.  Treatment with mannitol, 3% sodium, and tolvaptan have lead to improvement in sodium and her neurologic condition.  - neuro checks - SBP<160 - DVT prophylaxis today - Follow up pathology - Dexamethasone 10q6 today.  Will wean tomorrow. - Will continue to monitor sodium q2hrs.  If stable this AM, will stretch to Q4 hr checks.  She may need to restart normal saline this AM so that she does not become hypovolemic.  If her sodium is downtrending today, we will also reconsider 3% sodium.    I have updated her daughter. I am hopeful we will see continued improvement.  Meade Maw MD, [facility] Department of Neurosurgery

## 2021-04-29 NOTE — Progress Notes (Signed)
Sodium re corrected after Tolvaptan administration 123 > 132 > 135.  3% turned off at 02:00. Dr. Cari Caraway consulted regarding trend. No focal neurological deficits - continue Q 2 sodium monitoring - 3 % discontinued, reorder if needed   Venetia Night, AGACNP-BC Acute Care Nurse Practitioner [place]   [phone] / [phone] Please see Amion for pager details.

## 2021-04-29 NOTE — Progress Notes (Signed)
[place] for 3% saline infusion and Tolvaptan  5/7- medications discontinued  Indication: hyponatremia  Patient Measurements: Height: 5\' 5"  (165.1 cm) Weight: 59 kg (130 lb) IBW/kg (Calculated) : 57  Vital Signs: Temp: 97.6 F (36.4 C) (05/07 0730) Temp Source: Oral (05/07 0730) BP: 96/76 (05/07 0815) Pulse Rate: 100 (05/07 0815) Intake/Output from previous day: 05/06 0701 - 05/07 0700 In: 2513.7 [I.V.:1662.8; IV Piggyback:850.9] Out: 5110 [Urine:5075; Blood:35] Intake/Output from this shift: Total I/O In: -  Out: [place]: Recent Labs    04/26/21 1841 04/27/21 0312 04/28/21 0326 04/28/21 1502 04/28/21 2245 04/29/21 0513  WBC 7.7 7.3 8.8  --   --   --   HGB 13.7 13.7 14.1  --   --   --   HCT 41.0 40.0 40.3  --   --   --   PLT 201 194 213  --   --   --   CREATININE 0.62 0.53 0.43* 0.46  --   --   MG  --   --   --   --   --  2.4  PHOS  --   --   --   --   --  3.9  ALBUMIN  --   --   --   --  3.8  --   PROT  --   --   --   --  6.9  --   AST  --   --   --   --  19  --   ALT  --   --   --   --  18  --   ALKPHOS  --   --   --   --  25*  --   BILITOT  --   --   --   --  1.2  --   BILIDIR  --   --   --   --  0.3*  --   IBILI  --   --   --   --  0.9  --    Estimated Creatinine Clearance: 58.9 mL/min (by C-G formula based on SCr of 0.46 mg/dL).   Microbiology: Recent Results (from the past 720 hour(s))  Resp Panel by RT-PCR (Flu A&B, Covid) Nasopharyngeal Swab     Status: None   Collection Time: 04/26/21 10:10 PM   Specimen: Nasopharyngeal Swab; Nasopharyngeal(NP) swabs in vial transport medium  Result Value Ref Range Status   SARS Coronavirus 2 by RT PCR NEGATIVE NEGATIVE Final    Comment: (NOTE) SARS-CoV-2 target nucleic acids are NOT DETECTED.  The SARS-CoV-2 RNA is generally detectable in upper respiratory specimens during the acute phase of infection. The lowest concentration of SARS-CoV-2 viral copies  this assay can detect is 138 copies/mL. A negative result does not preclude SARS-Cov-2 infection and should not be used as the sole basis for treatment or other patient management decisions. A negative result may occur with  improper specimen collection/handling, submission of specimen other than nasopharyngeal swab, presence of viral mutation(s) within the areas targeted by this assay, and inadequate number of viral copies(<138 copies/mL). A negative result must be combined with clinical observations, patient history, and epidemiological information. The expected result is Negative.  Fact Sheet for Patients:  EntrepreneurPulse.com.au  Fact Sheet for Healthcare Providers:  IncredibleEmployment.be  This test is no t yet approved or cleared by the [place] FDA and  has been authorized for detection and/or diagnosis of SARS-CoV-2 by FDA  under an Emergency Use Authorization (EUA). This EUA will remain  in effect (meaning this test can be used) for the duration of the COVID-19 declaration under Section 564(b)(1) of the Act, 21 U.S.C.section 360bbb-3(b)(1), unless the authorization is terminated  or revoked sooner.       Influenza A by PCR NEGATIVE NEGATIVE Final   Influenza B by PCR NEGATIVE NEGATIVE Final    Comment: (NOTE) The Xpert Xpress SARS-CoV-2/FLU/RSV plus assay is intended as an aid in the diagnosis of influenza from Nasopharyngeal swab specimens and should not be used as a sole basis for treatment. Nasal washings and aspirates are unacceptable for Xpert Xpress SARS-CoV-2/FLU/RSV testing.  Fact Sheet for Patients: EntrepreneurPulse.com.au  Fact Sheet for Healthcare Providers: IncredibleEmployment.be  This test is not yet approved or cleared by the [place] FDA and has been authorized for detection and/or diagnosis of SARS-CoV-2 by FDA under an Emergency Use Authorization (EUA). This EUA  will remain in effect (meaning this test can be used) for the duration of the COVID-19 declaration under Section 564(b)(1) of the Act, 21 U.S.C. section 360bbb-3(b)(1), unless the authorization is terminated or revoked.  Performed at [facility], [address] [place], [place] [address]   MRSA PCR Screening     Status: None   Collection Time: 04/28/21  1:20 PM   Specimen: Nasal Mucosa; Nasopharyngeal  Result Value Ref Range Status   MRSA by PCR NEGATIVE NEGATIVE Final    Comment:        The GeneXpert MRSA Assay (FDA approved for NASAL specimens only), is one component of a comprehensive MRSA colonization surveillance program. It is not intended to diagnose MRSA infection nor to guide or monitor treatment for MRSA infections. Performed at [facility], [address] [place], [place] [address]     Medical History: History reviewed. No pertinent past medical history.  Medications:  Scheduled:  . Chlorhexidine Gluconate Cloth  6 each Topical Daily  . dexamethasone (DECADRON) injection  10 mg Intravenous Q6H  . enoxaparin (LOVENOX) injection  40 mg Subcutaneous Q24H  . pantoprazole (PROTONIX) IV  40 mg Intravenous QHS  . senna-docusate  2 tablet Per Tube BID    Assessment: 70 y.o. female with no known medical history as she has not followed with a physician for a long time, who presented to the emergency room with unsteady gait and recurrent falls as well as altered mental status with confusion.  She has hyponatremia and is being started on a hypertonic saline infusion. She is also being administered mannitol. Hypertonic saline is being started at 39 mL/hr.  5/6 @ 2023 Na 121- Per Provider 3% NaCl increased to 11mL/hr and patient to receive tolvaptan x 1 dose at 2155.  5/6 2245 Na = 123 5/7 0108 Na = 132, 3% NaCl - Stopped 5/7 0257 Na = 135, 3% NaCl - DCd 5/7 0513 Na = 138  Goal of Therapy: Increase Na by 4-6 mEq/L in 4-6 hour  Plan:  Monitor  sodium every 2 hours x 2 then every 4 hours  Notify MD for rate changes as appropriate  Call RN to stop infusion and notify MD if  Na increases by 4 mEq/L or more in the first 2 hours or  Na increases by 6 mEq/L or more in the first 4 hours   Per provider note- Goal Na 130-135 by 0500 am labs   Noralee Space, PharmD Clinical Pharmacist 04/29/2021 8:48 AM

## 2021-04-29 NOTE — Progress Notes (Signed)
This RN was in room behind head of bed at sink, and heard patient sneeze then say "Ooooo." Turned around to find that patient had pulled out NG tube. Believe that patient may have accidentally pulled it out when she brought her hands to her face when she sneezed. When I told patient, "Oh, I bet that hurt!" pt replied with "Sorry." No bleeding from nose evident, no need to replace NG tube at this time.

## 2021-04-30 ENCOUNTER — Encounter: Payer: Self-pay | Admitting: Family Medicine

## 2021-04-30 DIAGNOSIS — E871 Hypo-osmolality and hyponatremia: Secondary | ICD-10-CM

## 2021-04-30 DIAGNOSIS — G9389 Other specified disorders of brain: Secondary | ICD-10-CM

## 2021-04-30 LAB — PHOSPHORUS: Phosphorus: 3.6 mg/dL (ref 2.5–4.6)

## 2021-04-30 LAB — CBC
HCT: 42 % (ref 36.0–46.0)
Hemoglobin: 14.3 g/dL (ref 12.0–15.0)
MCH: 28.6 pg (ref 26.0–34.0)
MCHC: 34 g/dL (ref 30.0–36.0)
MCV: 84 fL (ref 80.0–100.0)
Platelets: 229 10*3/uL (ref 150–400)
RBC: 5 MIL/uL (ref 3.87–5.11)
RDW: 12.4 % (ref 11.5–15.5)
WBC: 13.3 10*3/uL — ABNORMAL HIGH (ref 4.0–10.5)
nRBC: 0 % (ref 0.0–0.2)

## 2021-04-30 LAB — BASIC METABOLIC PANEL
Anion gap: 7 (ref 5–15)
Anion gap: 7 (ref 5–15)
Anion gap: 7 (ref 5–15)
BUN: 22 mg/dL (ref 8–23)
BUN: 20 mg/dL (ref 8–23)
BUN: 19 mg/dL (ref 8–23)
CO2: 24 mmol/L (ref 22–32)
CO2: 25 mmol/L (ref 22–32)
CO2: 27 mmol/L (ref 22–32)
Calcium: 9.5 mg/dL (ref 8.9–10.3)
Calcium: 9.3 mg/dL (ref 8.9–10.3)
Calcium: 9.3 mg/dL (ref 8.9–10.3)
Chloride: 113 mmol/L — ABNORMAL HIGH (ref 98–111)
Chloride: 113 mmol/L — ABNORMAL HIGH (ref 98–111)
Chloride: 111 mmol/L (ref 98–111)
Creatinine, Ser: 0.75 mg/dL (ref 0.44–1.00)
Creatinine, Ser: 0.64 mg/dL (ref 0.44–1.00)
Creatinine, Ser: 0.77 mg/dL (ref 0.44–1.00)
GFR, Estimated: >60 mL/min (ref >60)
GFR, Estimated: >60 mL/min (ref >60)
GFR, Estimated: >60 mL/min (ref >60)
Glucose, Bld: 130 mg/dL — ABNORMAL HIGH (ref 70–99)
Glucose, Bld: 121 mg/dL — ABNORMAL HIGH (ref 70–99)
Glucose, Bld: 102 mg/dL — ABNORMAL HIGH (ref 70–99)
Potassium: 4 mmol/L (ref 3.5–5.1)
Potassium: 4 mmol/L (ref 3.5–5.1)
Potassium: 4 mmol/L (ref 3.5–5.1)
Sodium: 144 mmol/L (ref 135–145)
Sodium: 145 mmol/L (ref 135–145)
Sodium: 145 mmol/L (ref 135–145)

## 2021-04-30 LAB — SODIUM: Sodium: 144 mmol/L (ref 135–145)

## 2021-04-30 LAB — MAGNESIUM: Magnesium: 2.6 mg/dL — ABNORMAL HIGH (ref 1.7–2.4)

## 2021-04-30 MED ORDER — DEXAMETHASONE SODIUM PHOSPHATE 4 MG/ML IJ SOLN
4.0000 mg | Freq: Four times a day (QID) | INTRAMUSCULAR | Status: DC
  Administered 2021-04-30 – 2021-05-01 (×4): 4 mg via INTRAVENOUS
  Filled 2021-04-30 (×4): qty 1

## 2021-04-30 NOTE — Progress Notes (Signed)
Pt very impulsive trying to get out of bed multiple times, coloring offered pt wouldn't color, busy blanket given pt no interested, pulling tele off as well-reapplied and distracted, tv playing in room

## 2021-04-30 NOTE — Progress Notes (Signed)
Attempted to re-insert an NG tube for medication administration. Upon insertion, patient became very agitated was difficult to redirect. After tube placement, the patient remained agitated and was reaching for the tube. While trying to calm the patient, she once again removed the NG tube. Re-insertion was not attempted again.  Cameron Ali, RN

## 2021-04-30 NOTE — Progress Notes (Signed)
Educated family and pt on telesitter monitor, agree with telesitter

## 2021-04-30 NOTE — Progress Notes (Signed)
Pt's bed alarm going off, patient attempting to get out of bed. This RN redirecting patient, asking why she was getting out of bed. Patient pointed at the wall and said, "He needs his pants to do some crack." Patient seems to be hallucinating. Able to easily be redirected to lay back in bed. Pleasantly confused.

## 2021-04-30 NOTE — Progress Notes (Addendum)
Inpatient Rehab Admissions Coordinator Note:   Per therapy recommendations, pt was screened for CIR candidacy by Asheley Hellberg, MS CCC-SLP. At this time, Pt. Appears to have functional decline and is a potential candidate for CIR. Will request order for rehab consult per protocol.  Please contact me with questions.   Esequiel Kleinfelter, MS, CCC-SLP Rehab Admissions Coordinator  [phone] (celll) [phone] (office)   

## 2021-04-30 NOTE — Progress Notes (Signed)
NAME:  Audrey Barron, MRN:  [id number], DOB:  09-15-1951, LOS: 3 ADMISSION DATE:  04/26/2021, INITIAL CONSULTATION DATE: 04/28/2021 REFERRING [id number] Cari Caraway, MD  CHIEF COMPLAINT: Left Brain mass    Brief Patient Description  70 y.o female with PMH of ovarian cancer presenting to the ED on 5/4 with recurrent falls, confusion, slurred speech and left facial droop. Imaging demonstrated large left heterogeneously enhancing lesion, concerning for neoplasm. Patient was evaluated by Neurosurgery who recommended CT CAP to confirm no other primary lesion then proceed with open biopsy on 5/6. She was also started on Keppra for seizure prophylaxis.  Patient was taken to the OR 5/6 for left craniotomy tumor excision and returned to the ICU for close monitoring. 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  After giving mannitol, 3% sodium, and tolvaptan, she has had a significant rise in her serum sodium, and subsequent improvement in neurological condition.  Pertinent  Medical History  Malignant neoplasm of ovary, unspecified laterality   Significant Hospital Events: Including procedures, antibiotic start and stop dates in addition to other pertinent events   04/26/2021: Admitted to [facility] unit with left brain mass. Neurosurgery consulted 04/28/2021: Taken to OR for left craniotomy tumor excision and returned to the ICU for close monitoring. PCCM consulted 04/28/2021: Patient with significant drop in serum Na+. STAT Mannitol bolus and 3% hypertonic solution infusion administered. Received additional tolvaptan overnight from drop in Na+ 04/29/2021: Sodium re corrected after Tolvaptan administration 123 > 132 > 135. 3% turned off  Cultures:  05/04: SARS-CoV-2 PCR>> negative 05/04: Influenza PCR>> negative   Antimicrobials:  Non required  Consults:  Neurosurgery PCCM  Procedures:   5/06: Left craniotomy Tumor excision  Significant Diagnostic Tests:  05/04: MRI Brain  >Multifocal abnormal contrast enhancement centered within the left basal ganglia and extending into the left temporal lobe, crossing the midline at the level of the fornix. 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 The lack of necrosis is not characteristic of glioblastoma, which remains the second consideration. 2. Rightward midline shift measuring 12 mm. 05/04: Noncontrast CT head>Large heterogeneous mass centered in the region of the left basal ganglia, with significant surrounding vasogenic edema and mass effect. MRI is recommended for further evaluation. 2. No acute infarct or hemorrhage 05/05: CT Chest, abdomen and pelvis>. No adenopathy in the chest, abdomen or pelvis. No signs of Neoplasm. 2. Mild gastric thickening versus under distension. Correlate with any symptoms of gastritis. 3. Engorgement of parametrial veins and LEFT ovarian vein, nonspecific but can be seen in the setting of pelvic congestion syndrome.  Interim History / Subjective:  Patient awake and alert this morning. Attempting to have conversation this morning with staff.  Speech is comprehensible. She remains confused but easily re-oriented and re-directable.  OBJECTIVE   Blood pressure 106/71, pulse 73, temperature 98.1 F (36.7 C), temperature source Axillary, resp. rate 17, height 5\' 5"  (1.651 m), weight 59 kg, SpO2 97 %.        Intake/Output Summary (Last 24 hours) at 04/30/2021 1121 Last data filed at 04/30/2021 0400 Gross per 24 hour  Intake 774.69 ml  Output 280 ml  Net 494.69 ml   Filed Weights   04/26/21 1752  Weight: 59 kg    Physical Examination: GENERAL:70 year-old patient lying in the bed with no acute distress.  EYES: Pupils equal, round, reactive to light and accommodation. No scleral icterus. Extraocular muscles intact.  HEENT: Head atraumatic, normocephalic. Oropharynx and nasopharynx clear. Left crani site inscision NECK:  Supple,  no jugular venous distention. No thyroid enlargement, no tenderness.   LUNGS: Normal breath sounds bilaterally, no wheezing, rales,rhonchi or crepitation. No use of accessory muscles of respiration.  CARDIOVASCULAR: S1, S2 normal. No murmurs, rubs, or gallops.  ABDOMEN: Soft, nontender, nondistended. Bowel sounds present. No organomegaly or mass.  EXTREMITIES: No pedal edema, cyanosis, or clubbing.  NEUROLOGIC: Cranial nerves II through XII are intact.Except with right facial droop, left eye apraxia, mild to moderate dysarthria.  Muscle strength in all extremities 4/5 L>R. Sensation intact. Gait not checked.  PSYCHIATRIC: The patient is alert and oriented x 3.  SKIN: No obvious rash, lesion, or ulcer.   Labs/imaging that I havepersonally reviewed  (right click and "Reselect all SmartList Selections" daily)    Labs   CBC: Recent Labs  Lab 04/26/21 1841 04/27/21 0312 04/28/21 0326 04/30/21 0141  WBC 7.7 7.3 8.8 13.3*  HGB 13.7 13.7 14.1 14.3  HCT 41.0 40.0 40.3 42.0  MCV 85.8 84.6 80.9 84.0  PLT 201 194 213 967    Basic Metabolic Panel: Recent Labs  Lab 04/26/21 1841 04/27/21 0312 04/28/21 0326 04/28/21 1502 04/28/21 1643 04/29/21 0513 04/29/21 0843 04/29/21 1255 04/29/21 1636 04/29/21 2242 04/30/21 0141 04/30/21 0938  NA 131* 131* 121* 118*   < > 138   < > 143 141 144 144 144  K 4.0 3.9 4.0 3.7  --   --   --   --   --   --  4.0  --   CL 97* 97* 90* 89*  --   --   --   --   --   --  113*  --   CO2 27 27 20* 18*  --   --   --   --   --   --  24  --   GLUCOSE 102* 90 101* 150*  --   --   --   --   --   --  130*  --   BUN 9 7* 6* 7*  --   --   --   --   --   --  22  --   CREATININE 0.62 0.53 0.43* 0.46  --   --   --   --   --   --  0.75  --   CALCIUM 9.2 9.3 9.1 8.8*  --   --   --   --   --   --  9.5  --   MG  --   --   --   --   --  2.4  --   --   --   --  2.6*  --   PHOS  --   --   --   --   --  3.9  --   --   --   --  3.6  --    < > = values in this interval not displayed.   GFR: Estimated Creatinine Clearance: 58.9 mL/min (by C-G  formula based on SCr of 0.75 mg/dL). Recent Labs  Lab 04/26/21 1841 04/27/21 0312 04/28/21 0326 04/30/21 0141  WBC 7.7 7.3 8.8 13.3*    Liver Function Tests: Recent Labs  Lab 04/28/21 2245  AST 19  ALT 18  ALKPHOS 25*  BILITOT 1.2  PROT 6.9  ALBUMIN 3.8   No results for input(s): LIPASE, AMYLASE in the last 168 hours. No results for input(s): AMMONIA in the last 168 hours.  ABG No results found for: PHART, PCO2ART,  PO2ART, HCO3, TCO2, ACIDBASEDEF, O2SAT   Coagulation Profile: No results for input(s): INR, PROTIME in the last 168 hours.  Cardiac Enzymes: No results for input(s): CKTOTAL, CKMB, CKMBINDEX, TROPONINI in the last 168 hours.  HbA1C: No results found for: HGBA1C  CBG: Recent Labs  Lab 04/28/21 1235 04/28/21 2254 04/29/21 2322  GLUCAP 110* 142* 118*    Allergies Not on File   Home Medications  Prior to Admission medications   Medication Sig Start Date End Date Taking? Authorizing Provider  aspirin 81 MG chewable tablet Chew 81 mg by mouth daily.   Yes [provider]  cyanocobalamin 1000 MCG tablet Take 1,000 mcg by mouth daily.   Yes [provider]    Scheduled Meds: . Chlorhexidine Gluconate Cloth  6 each Topical Daily  . dexamethasone (DECADRON) injection  4 mg Intravenous Q6H  . enoxaparin (LOVENOX) injection  40 mg Subcutaneous Q24H  . pantoprazole (PROTONIX) IV  40 mg Intravenous QHS  . senna-docusate  2 tablet Per Tube BID   Continuous Infusions: . 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 S/p Left  temporal Craniotomy for open biopsy POD # 2 - Supplemental O2 as needed to maintain O2 saturations > 92% - Monitor Crani site for s/s of post-op infections -Trend,CBC, BMP, Monitor fever curve - Vital signs/blood pressure with neuro checks Q4hours   - Seizure precautions - Continue Keppra 500 mg BID - Continue Dexamethasone - Keep BP <160/105 with short-acting antihypertensives (Labetolol, Hydralazine or Nicardipine Gtt as needed).  - Start DVT prophylaxis today with Lovenox - NPO for now until passes swallow screen  - PT consult, OT consult, Speech consult - Aspiration precautions, Bleeding precautions - Obtain STAT head CT for any new acute or new neurological deficits - Neurosurgery following   Severe Hyponatremia-likely SIADH in the setting of brain lesion? S/p Mannitol, 3 % hypertonic saline, and Tolvaptan  Current sodium stable at 144 -Continue to followserial sodium every 12 hours per Neurosurgery - Neurochecks as above - Gently IVFs with NS   Altered Mental Status - secondary to brain mass as above Mental status now improving - Provide supportive care - Avoid sedatives - Neuro checks as above    Best practice (right click and "Reselect all SmartList Selections" daily)  Diet:  NPO Pain/Anxiety/Delirium protocol (if indicated): Yes (RASS goal 0) VAP protocol (if indicated): Not indicated DVT prophylaxis: LMWH GI prophylaxis: PPI Glucose control:  SSI No Central venous access:  N/A Arterial line:  N/A Foley:  Yes, and it is no longer needed Mobility:  bed rest  PT consulted: Yes Last date of multidisciplinary goals of care discussion [04/30/2021 Code Status:  full code Disposition: Medsurg  Critical care time: El Dara, DNP, FNP-C, AGACNP-BC Acute Care Nurse Practitioner  [facility] Pager: [phone] [place] at [facility]

## 2021-04-30 NOTE — Evaluation (Signed)
Physical Therapy Evaluation Patient Details Name: Audrey Barron MRN: [id number] DOB: [birth year] Today's Date: 04/30/2021   History of Present Illness  presented to ER secondary to unsteady gait, recurrent falls, AMS; admitted for management of large intracranial mass (L BG extending to temporal lobe) with significant vasogenic edema and 61mm midline shift to R.  Hospital course significant for L temporal craniotomy for open biopsy (02/01/40), complicated by significant edema and SIADH post-op.  Clinical Impression  Patient sleeping in bed upon arrival to session; easily awakens to voice.  Once awake, alert and oriented to self, location as hospital (when given yes/no choices); follows simple commands (optimized with therapist demonstration), pleasant and cooperative throughout.  Slightly impulsive at times; generally unaware of situation, functional deficits and overall safety needs. Mild weakness noted throughout R hemi-body, but grossly functional for basic transfers and mobility. Generally 'clumsy' and discoordinated in gross movement patterns; poor core stability, with limited balance reactions all planes.  Currently requiring min/mod assist for bed mobility; mod assist for sit/stand, basic transfers and gait (50') with L HHA.  Demonstrates short, shuffling steps; mod manual faciltiation for L ant/lateral weight shift to fully unweight/clear/advance R LE.  Increased sway in all directions; limited balance reactions in all planes. Unsafe to attempt without constant hands-on assist Would benefit from skilled PT to address above deficits and promote optimal return to PLOF.;recommend transition to acute inpatient rehab upon discharge for high-intensity, post-acute rehab services (pending pathology results, treatment plan and overall goals of care)      Follow Up Recommendations CIR    Equipment Recommendations       Recommendations for Other Services       Precautions / Restrictions  Precautions Precautions: Fall Restrictions Weight Bearing Restrictions: No      Mobility  Bed Mobility Overal bed mobility: Needs Assistance Bed Mobility: Supine to Sit     Supine to sit: Min assist;Mod assist     General bed mobility comments: assist for truncal elevation    Transfers Overall transfer level: Needs assistance Equipment used: 1 person hand held assist Transfers: Sit to/from Stand Sit to Stand: Mod assist         General transfer comment: assist for lift off, balance and stabilization in A/P plane (tendancy towards posterior LOB)  Ambulation/Gait Ambulation/Gait assistance: Mod assist Gait Distance (Feet): 50 Feet Assistive device: 1 person hand held assist       General Gait Details: short, shuffling steps; mod manual faciltiation for L ant/lateral weight shift to fully unweight/clear/advance R LE.  Increased sway in all directions; limited balance reactions in all planes. Unsafe to attempt without constant hands-on assist  Stairs            Wheelchair Mobility    Modified Rankin (Stroke Patients Only)       Balance Overall balance assessment: Needs assistance Sitting-balance support: No upper extremity supported;Feet supported Sitting balance-Leahy Scale: Fair     Standing balance support: Single extremity supported Standing balance-Leahy Scale: Poor                               Pertinent Vitals/Pain Pain Assessment: Faces Faces Pain Scale: No hurt    Home Living Family/patient expects to be discharged to:: Private residence Living Arrangements: Children Available Help at Discharge: Family;Available PRN/intermittently Type of Home: House           Additional Comments: Patient limited historian, will verify social history with family as available.  Prior Function Level of Independence: Independent         Comments: Patient limited historian, will verify social history with family as available. Per RN  (received in report from daughter), patient baseline indep and living alone.  Has experienced progressive unsteadiness, recurrent falls and AMS in recent months.  Daughter moved in with patient at first of year to support/assist as needed.     Hand Dominance   Dominant Hand: Right    Extremity/Trunk Assessment   Upper Extremity Assessment Upper Extremity Assessment: Generalized weakness (mild weakness R UE (4-/5) compared to L UE (4+/5); generally clumsy/discoordinated in gross movement)    Lower Extremity Assessment Lower Extremity Assessment: Generalized weakness (mild weakness L LE (4-/5) compared to R LE (4+/5); generally clumsy/discoordinated in gross movement.  Denies sensory deficit; appropriately identifes and localizes light touch bilat)       Communication   Communication:  (slightly 'thick' and dysarthric at times)  Cognition Arousal/Alertness: Awake/alert Behavior During Therapy: WFL for tasks assessed/performed Overall Cognitive Status: No family/caregiver present to determine baseline cognitive functioning                                 General Comments: Oriented to self and location as hospital (when given yes/no choices); follows simple commands, very pleasant and cooperative. Poor insight/awareness of condition and associated deficits.      General Comments      Exercises     Assessment/Plan    PT Assessment Patient needs continued PT services  PT Problem List Decreased strength;Decreased activity tolerance;Decreased balance;Decreased mobility;Decreased knowledge of precautions;Decreased cognition;Decreased coordination;Decreased range of motion;Decreased knowledge of use of DME;Decreased safety awareness       PT Treatment Interventions DME instruction;Gait training;Stair training;Functional mobility training;Therapeutic activities;Therapeutic exercise;Balance training;Neuromuscular re-education;Cognitive remediation;Patient/family education     PT Goals (Current goals can be found in the Care Plan section)  Acute Rehab PT Goals Patient Stated Goal: to return home PT Goal Formulation: With patient Time For Goal Achievement: 05/14/21 Potential to Achieve Goals: Fair    Frequency 7X/week   Barriers to discharge        Co-evaluation               AM-PAC PT "6 Clicks" Mobility  Outcome Measure Help needed turning from your back to your side while in a flat bed without using bedrails?: None Help needed moving from lying on your back to sitting on the side of a flat bed without using bedrails?: A Lot Help needed moving to and from a bed to a chair (including a wheelchair)?: A Lot Help needed standing up from a chair using your arms (e.g., wheelchair or bedside chair)?: A Lot Help needed to walk in hospital room?: A Lot Help needed climbing 3-5 steps with a railing? : A Lot 6 Click Score: 14    End of Session Equipment Utilized During Treatment: Gait belt Activity Tolerance: Patient tolerated treatment well Patient left: in bed;with call bell/phone within reach;with nursing/sitter in room Nurse Communication: Mobility status PT Visit Diagnosis: Muscle weakness (generalized) (M62.81);Hemiplegia and hemiparesis;Difficulty in walking, not elsewhere classified (R26.2) Hemiplegia - Right/Left: Right Hemiplegia - dominant/non-dominant: Dominant Hemiplegia - caused by: Other cerebrovascular disease    Time: 2025-4270 PT Time Calculation (min) (ACUTE ONLY): 20 min   Charges:   PT Evaluation $PT Eval Moderate Complexity: 1 Mod          Viktorya Arguijo H. Owens Shark, PT, DPT, NCS 04/30/21,  2:56 PM [phone]

## 2021-04-30 NOTE — Progress Notes (Signed)
    Attending Progress Note  DOS: 04/28/2021 - L temporal craniotomy for biopsy  History: Audrey Barron is here for left-sided brain tumor.    POD2: Patient has made substantial improvements. She is now awake, watching TV, and talking spontaneously.  POD1: Patient has made improvements overnight.  She is now interacting somewhat with staff. 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  After giving mannitol, 3% sodium, and tolvaptan, she has had a significant rise in her serum sodium, and subsequent improvement in neurological condition.  POD0: Patient is still recovering in ICU.  She is moving purposefully.  She is not responding to verbal commands.  Physical Exam: Vitals:   04/30/21 1030 04/30/21 1100  BP: 96/71 106/71  Pulse: 77 73  Resp: 15 17  Temp:    SpO2: 99% 97%   OE spontaneously She is now oriented to self and hospital spontaneously.  She does not know the year.  She is speaking spontaneously, but making paraphasic errors.  She names 2/3.  She does not repeat.   Slight R facial weakness. P 3 to 2 EOMI Sticks out tongue  Shows thumb on left.  MAEW with mild R weakness.  Dressing removed - incision c/d/i  Data:  Recent Labs  Lab 04/28/21 0326 04/28/21 1502 04/28/21 1643 04/30/21 0141 04/30/21 0938  NA 121* 118*   < > 144 144  K 4.0 3.7  --  4.0  --   CL 90* 89*  --  113*  --   CO2 20* 18*  --  24  --   BUN 6* 7*  --  22  --   CREATININE 0.43* 0.46  --  0.75  --   GLUCOSE 101* 150*  --  130*  --   CALCIUM 9.1 8.8*  --  9.5  --    < > = values in this interval not displayed.   Recent Labs  Lab 04/28/21 2245  AST 19  ALT 18  ALKPHOS 25*     Recent Labs  Lab 04/27/21 0312 04/28/21 0326 04/30/21 0141  WBC 7.3 8.8 13.3*  HGB 13.7 14.1 14.3  HCT 40.0 40.3 42.0  PLT 194 213 229   No results for input(s): APTT, INR in the last 168 hours.      Other tests/results: HCT 04/28/21 with significant swelling but no  hemorrhage  Assessment/Plan:  Audrey Barron is doing much better POD2 after biopsy.   She suffered from SIADH around the time of surgery, but is now much improved with steroids and hypertonic saline (stopped in early AM on 04/29/21). She has had no additional treatment other than steroids in the past 24 hours.  - neuro checks q4 hours - DVT prophylaxis with lovenox - Follow up pathology - Dexamethasone to 4q6 today - Speech eval for swallowing. If passes, transition all meds to PO - Monitor BMP q12 - Transfer OOU  Meade Maw MD, [facility] Department of Neurosurgery

## 2021-05-01 ENCOUNTER — Encounter: Payer: Self-pay | Admitting: Neurosurgery

## 2021-05-01 DIAGNOSIS — F05 Delirium due to known physiological condition: Secondary | ICD-10-CM

## 2021-05-01 LAB — BASIC METABOLIC PANEL
Anion gap: 7 (ref 5–15)
Anion gap: 6 (ref 5–15)
BUN: 20 mg/dL (ref 8–23)
BUN: 26 mg/dL — ABNORMAL HIGH (ref 8–23)
CO2: 26 mmol/L (ref 22–32)
CO2: 24 mmol/L (ref 22–32)
Calcium: 9.2 mg/dL (ref 8.9–10.3)
Calcium: 9.1 mg/dL (ref 8.9–10.3)
Chloride: 111 mmol/L (ref 98–111)
Chloride: 109 mmol/L (ref 98–111)
Creatinine, Ser: 0.6 mg/dL (ref 0.44–1.00)
Creatinine, Ser: 0.68 mg/dL (ref 0.44–1.00)
GFR, Estimated: >60 mL/min (ref >60)
GFR, Estimated: >60 mL/min (ref >60)
Glucose, Bld: 101 mg/dL — ABNORMAL HIGH (ref 70–99)
Glucose, Bld: 116 mg/dL — ABNORMAL HIGH (ref 70–99)
Potassium: 3.7 mmol/L (ref 3.5–5.1)
Potassium: 4 mmol/L (ref 3.5–5.1)
Sodium: 144 mmol/L (ref 135–145)
Sodium: 139 mmol/L (ref 135–145)

## 2021-05-01 LAB — CBC
HCT: 40 % (ref 36.0–46.0)
Hemoglobin: 13.4 g/dL (ref 12.0–15.0)
MCH: 29.2 pg (ref 26.0–34.0)
MCHC: 33.5 g/dL (ref 30.0–36.0)
MCV: 87.1 fL (ref 80.0–100.0)
Platelets: 197 10*3/uL (ref 150–400)
RBC: 4.59 MIL/uL (ref 3.87–5.11)
RDW: 12.6 % (ref 11.5–15.5)
WBC: 10.2 10*3/uL (ref 4.0–10.5)
nRBC: 0 % (ref 0.0–0.2)

## 2021-05-01 LAB — PHOSPHORUS: Phosphorus: 4 mg/dL (ref 2.5–4.6)

## 2021-05-01 LAB — MAGNESIUM: Magnesium: 2.4 mg/dL (ref 1.7–2.4)

## 2021-05-01 MED ORDER — DEXAMETHASONE SODIUM PHOSPHATE 4 MG/ML IJ SOLN
4.0000 mg | Freq: Three times a day (TID) | INTRAMUSCULAR | Status: DC
  Administered 2021-05-01 – 2021-05-02 (×4): 4 mg via INTRAVENOUS
  Filled 2021-05-01 (×4): qty 1

## 2021-05-01 MED ORDER — DIPHENHYDRAMINE HCL 50 MG/ML IJ SOLN
50.0000 mg | Freq: Every evening | INTRAMUSCULAR | Status: DC | PRN
  Administered 2021-05-01: 50 mg via INTRAVENOUS
  Filled 2021-05-01: qty 1

## 2021-05-01 MED ORDER — QUETIAPINE FUMARATE 25 MG PO TABS
50.0000 mg | ORAL_TABLET | Freq: Every day | ORAL | Status: DC
  Administered 2021-05-01: 21:00:00 50 mg via ORAL
  Filled 2021-05-01: qty 2

## 2021-05-01 NOTE — Progress Notes (Addendum)
PROGRESS NOTE    Audrey Barron  NID:[id number] DOB: 10/29/1951 DOA: 04/26/2021 PCP: [facility]    Brief Narrative:  70 y.o female with PMH of ovarian cancer presenting to the ED on 5/4 with recurrent falls, confusion, slurred speech and left facial droop. Imaging demonstrated large left heterogeneously enhancing lesion, concerning for neoplasm. Patient was evaluated by Neurosurgery who recommended CT CAP to confirm no other primary lesion then proceed with open biopsy on 5/6. She was also started on Keppra for seizure prophylaxis.  Patient was taken to the OR 5/6 for left craniotomy tumor excision and returned to the ICU for close monitoring. 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 After giving mannitol, 3% sodium, and tolvaptan, she has had a significant rise in her serum sodium, and subsequent improvement in neurological condition.   Assessment & Plan:   Active Problems:   Brain mass   Hyponatremia  #1.  Left brain mass, s/p craniotomy, resection and biopsy. Delirium. 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 Patient currently on IV steroids. She is also on Keppra for seizure prevention. She also being followed by neurosurgery. I will obtain consult from oncology and decide the next steps of action. Also consulted social worker for discharge options. Patient still has visual hallucination, very poor sleep.  I will add Seroquel.  #2.  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 Sodium level has normalized.      DVT prophylaxis: Lovenox Code Status: Full Family Communication: daughter updated Disposition Plan:  .   Status is: Inpatient  Remains inpatient appropriate because:Inpatient level of care appropriate due to severity of illness   Dispo: The patient is from: Home              Anticipated d/c is to: Home              Patient currently is not medically stable to d/c.   Difficult to place patient  No        I/O last 3 completed shifts: In: 774.7 [IV Piggyback:774.7] Out: 8 [Urine:1020] Total I/O In: -  Out: 200 [Urine:200]     Consultants:   Neurosurgery  Oncology  Procedures: Craniotomy  Antimicrobials: None  Subjective: Patient still has significant visual hallucination, but less confused.  She has a poor sleep at night. Denies any headache or dizziness. No fever or chills. No dysuria hematuria  No short of breath or cough. No chest pain palpitation  Objective: Vitals:   04/30/21 1440 04/30/21 2021 05/01/21 0405 05/01/21 0843  BP: 116/82 (!) 130/93 131/89 (!) 128/95  Pulse: 69 64 66 64  Resp: 16 16  16   Temp: 97.6 F (36.4 C) (!) 97.5 F (36.4 C) 97.6 F (36.4 C) (!) 97.5 F (36.4 C)  TempSrc:  Oral Oral   SpO2: 98% 99% 98% 99%  Weight:      Height:        Intake/Output Summary (Last 24 hours) at 05/01/2021 1105 Last data filed at 05/01/2021 0955 Gross per 24 hour  Intake --  Output 1075 ml  Net -1075 ml   Filed Weights   04/26/21 1752  Weight: 59 kg    Examination:  General exam: Appears calm and comfortable  Respiratory system: Clear to auscultation. Respiratory effort normal. Cardiovascular system: S1 & S2 heard, RRR. No JVD, murmurs, rubs, gallops or clicks. No pedal edema. Gastrointestinal system: Abdomen is nondistended, soft and nontender. No organomegaly or masses felt. Normal bowel sounds heard. Central nervous system: Alert  and oriented x3.  No focal neurological deficits. Extremities: Symmetric 5 x 5 power. Skin: No rashes, lesions or ulcers Psychiatry: Mood & affect appropriate.     Data Reviewed: I have personally reviewed following labs and imaging studies  CBC: Recent Labs  Lab 04/26/21 1841 04/27/21 0312 04/28/21 0326 04/30/21 0141 05/01/21 0510  WBC 7.7 7.3 8.8 13.3* 10.2  HGB 13.7 13.7 14.1 14.3 13.4  HCT 41.0 40.0 40.3 42.0 40.0  MCV 85.8 84.6 80.9 84.0 87.1  PLT 201 194 213 229 956   Basic  Metabolic Panel: Recent Labs  Lab 04/28/21 1502 04/28/21 1643 04/29/21 0513 04/29/21 0843 04/30/21 0141 04/30/21 0938 04/30/21 1140 04/30/21 1643 05/01/21 0510  NA 118*   < > 138   < > 144 144 145 145 144  K 3.7  --   --   --  4.0  --  4.0 4.0 3.7  CL 89*  --   --   --  113*  --  113* 111 111  CO2 18*  --   --   --  24  --  25 27 26   GLUCOSE 150*  --   --   --  130*  --  121* 102* 101*  BUN 7*  --   --   --  22  --  20 19 20   CREATININE 0.46  --   --   --  0.75  --  0.64 0.77 0.60  CALCIUM 8.8*  --   --   --  9.5  --  9.3 9.3 9.2  MG  --   --  2.4  --  2.6*  --   --   --  2.4  PHOS  --   --  3.9  --  3.6  --   --   --  4.0   < > = values in this interval not displayed.   GFR: Estimated Creatinine Clearance: 58.9 mL/min (by C-G formula based on SCr of 0.6 mg/dL). Liver Function Tests: Recent Labs  Lab 04/28/21 2245  AST 19  ALT 18  ALKPHOS 25*  BILITOT 1.2  PROT 6.9  ALBUMIN 3.8   No results for input(s): LIPASE, AMYLASE in the last 168 hours. No results for input(s): AMMONIA in the last 168 hours. Coagulation Profile: No results for input(s): INR, PROTIME in the last 168 hours. Cardiac Enzymes: No results for input(s): CKTOTAL, CKMB, CKMBINDEX, TROPONINI in the last 168 hours. BNP (last 3 results) No results for input(s): PROBNP in the last 8760 hours. HbA1C: No results for input(s): HGBA1C in the last 72 hours. CBG: Recent Labs  Lab 04/28/21 1235 04/28/21 2254 04/29/21 2322  GLUCAP 110* 142* 118*   Lipid Profile: No results for input(s): CHOL, HDL, LDLCALC, TRIG, CHOLHDL, LDLDIRECT in the last 72 hours. Thyroid Function Tests: No results for input(s): TSH, T4TOTAL, FREET4, T3FREE, THYROIDAB in the last 72 hours. Anemia Panel: No results for input(s): VITAMINB12, FOLATE, FERRITIN, TIBC, IRON, RETICCTPCT in the last 72 hours. Sepsis Labs: No results for input(s): PROCALCITON, LATICACIDVEN in the last 168 hours.  Recent Results (from the past 240 hour(s))   Resp Panel by RT-PCR (Flu A&B, Covid) Nasopharyngeal Swab     Status: None   Collection Time: 04/26/21 10:10 PM   Specimen: Nasopharyngeal Swab; Nasopharyngeal(NP) swabs in vial transport medium  Result Value Ref Range Status   SARS Coronavirus 2 by RT PCR NEGATIVE NEGATIVE Final    Comment: (NOTE) SARS-CoV-2 target nucleic acids are NOT DETECTED.  The SARS-CoV-2 RNA is generally detectable in upper respiratory specimens during the acute phase of infection. The lowest concentration of SARS-CoV-2 viral copies this assay can detect is 138 copies/mL. A negative result does not preclude SARS-Cov-2 infection and should not be used as the sole basis for treatment or other patient management decisions. A negative result may occur with  improper specimen collection/handling, submission of specimen other than nasopharyngeal swab, presence of viral mutation(s) within the areas targeted by this assay, and inadequate number of viral copies(<138 copies/mL). A negative result must be combined with clinical observations, patient history, and epidemiological information. The expected result is Negative.  Fact Sheet for Patients:  EntrepreneurPulse.com.au  Fact Sheet for Healthcare Providers:  IncredibleEmployment.be  This test is no t yet approved or cleared by the [place] FDA and  has been authorized for detection and/or diagnosis of SARS-CoV-2 by FDA under an Emergency Use Authorization (EUA). This EUA will remain  in effect (meaning this test can be used) for the duration of the COVID-19 declaration under Section 564(b)(1) of the Act, 21 U.S.C.section 360bbb-3(b)(1), unless the authorization is terminated  or revoked sooner.       Influenza A by PCR NEGATIVE NEGATIVE Final   Influenza B by PCR NEGATIVE NEGATIVE Final    Comment: (NOTE) The Xpert Xpress SARS-CoV-2/FLU/RSV plus assay is intended as an aid in the diagnosis of influenza from  Nasopharyngeal swab specimens and should not be used as a sole basis for treatment. Nasal washings and aspirates are unacceptable for Xpert Xpress SARS-CoV-2/FLU/RSV testing.  Fact Sheet for Patients: EntrepreneurPulse.com.au  Fact Sheet for Healthcare Providers: IncredibleEmployment.be  This test is not yet approved or cleared by the [place] FDA and has been authorized for detection and/or diagnosis of SARS-CoV-2 by FDA under an Emergency Use Authorization (EUA). This EUA will remain in effect (meaning this test can be used) for the duration of the COVID-19 declaration under Section 564(b)(1) of the Act, 21 U.S.C. section 360bbb-3(b)(1), unless the authorization is terminated or revoked.  Performed at [facility], [address] [place], [place] [address]   MRSA PCR Screening     Status: None   Collection Time: 04/28/21  1:20 PM   Specimen: Nasal Mucosa; Nasopharyngeal  Result Value Ref Range Status   MRSA by PCR NEGATIVE NEGATIVE Final    Comment:        The GeneXpert MRSA Assay (FDA approved for NASAL specimens only), is one component of a comprehensive MRSA colonization surveillance program. It is not intended to diagnose MRSA infection nor to guide or monitor treatment for MRSA infections. Performed at [facility], [address], [place], [place] [address]          Radiology Studies: No results found.      Scheduled Meds: . Chlorhexidine Gluconate Cloth  6 each Topical Daily  . dexamethasone (DECADRON) injection  4 mg Intravenous Q8H  . enoxaparin (LOVENOX) injection  40 mg Subcutaneous Q24H  . pantoprazole (PROTONIX) IV  40 mg Intravenous QHS  . senna-docusate  2 tablet Per Tube BID   Continuous Infusions: . levETIRAcetam 500 mg (05/01/21 0229)     LOS: 4 days    Time spent: 28 minutes    Sharen Hones, MD [facility]   To contact the attending provider between 7A-7P  or the covering provider during after hours 7P-7A, please log into the web site [email or web address] and access using universal [place] password for that web site. If you do not have  the password, please call the hospital operator.  05/01/2021, 11:05 AM

## 2021-05-01 NOTE — Evaluation (Signed)
Clinical/Bedside Swallow Evaluation Patient Details  Name: Audrey Barron MRN: [id number] Date of Birth: 1951-03-05  Today's Date: 05/01/2021 Time: SLP Start Time (ACUTE ONLY): 1110 SLP Stop Time (ACUTE ONLY): 1142 SLP Time Calculation (min) (ACUTE ONLY): 32 min  Past Medical History: History reviewed. No pertinent past medical history. Past Surgical History:  Past Surgical History:  Procedure Laterality Date  . APPLICATION OF CRANIAL NAVIGATION N/A 04/28/2021   Procedure: APPLICATION OF CRANIAL NAVIGATION;  Surgeon: Meade Maw, MD;  Location: [facility] ORS;  Service: Neurosurgery;  Laterality: N/A;  . CRANIOTOMY Left 04/28/2021   Procedure: CRANIOTOMY TUMOR EXCISION;  Surgeon: Meade Maw, MD;  Location: [facility] ORS;  Service: Neurosurgery;  Laterality: Left;   HPI:  Per admitting H&P "Audrey Barron is a 70 y.o. female with no known medical history as she has not followed with a physician for a long time, who presented to the emergency room with a Audrey Barron of unsteady gait and recurrent falls as well as altered mental status with confusion.  She was found outside her home in her underpants.  No reported fever or chills.  No nausea or vomiting or abdominal pain.  No chest pain or palpitations.  No focal muscle weakness.  She denied any slurred speech or facial droop.  No urinary or stool incontinence.  No tinnitus or vertigo.  ED Course: When she came to the ER temperature was 99.5 and vital signs were otherwise within normal.  Labs revealed hyponatremia and hypochloremia and were otherwise N unremarkable.  COVID-19 PCR and influenza antigens came back negative.     EKG as reviewed by me : Showed normal sinus rhythm with a rate of 71 with possible left atrial enlargement and Q waves anteroseptally.  Imaging: Noncontrasted head CT scan revealed:  1. Large heterogeneous mass centered in the region of the left basal  ganglia, with significant surrounding vasogenic edema and mass  effect. MRI  is recommended for further evaluation.  2. No acute infarct or hemorrhage.     Brain MRI with and without contrast revealed the following:  1. Multifocal abnormal contrast enhancement centered within the left  basal ganglia and extending into the left temporal lobe, crossing  the midline at the level of the fornix. 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 The lack of necrosis is not characteristic of  glioblastoma, which remains the second consideration.  2. Rightward midline shift measuring 12 mm.     The patient will be admitted to a medical monitored bed for further evaluation and management."   Assessment / Plan / Recommendation Clinical Impression  This pleasant 70 YO female recently underwent left craniotomoy tumor excision and biopsy after presenting with recurrent falls, confuson and slurred speech at home. Bedside swallow eval today to determine safest PO diet. Oral mech exam revealed decreased facial and lingual strength and coordinatoion. Pt tolerated all consistencies without overt s/s of aspiration. Noted oral transit delay with solids needing extra time to clear. Vocal quailty remained clear and laryngeal elevation appeared adequate. Pt tolerated purees well. When attempting to feed herself, Pt had difficulty gripping the spoon and difficulty using her right hand to guide to the food to her mouth. Pt will need assistance with meals. Noted OT consult. Will order Dys 2 diet with thin liquids. Try meds whole one at a time, with or applesauce if needed. Noted Dysrathric speech but mostly intelligible. Pt would aslo benefit from Speech Therapy services for speech and language evaluation. ST to follow up with toleration of diet and  adnvance as tolerated. Prognosis good. SLP Visit Diagnosis: Dysphagia, oropharyngeal phase (R13.12)    Aspiration Risk  Mild aspiration risk    Diet Recommendation Dysphagia 2 (Fine chop);Thin liquid   Liquid Administration via: Straw Medication Administration: Whole  meds with liquid Supervision: Staff to assist with self feeding;Full supervision/cueing for compensatory strategies Compensations: Slow rate;Small sips/bites;Minimize environmental distractions Postural Changes: Seated upright at 90 degrees;Remain upright for at least 30 minutes after po intake    Other  Recommendations   Continues ST services  Follow up Recommendations Inpatient Rehab      Frequency and Duration min 2x/week  2 weeks       Prognosis Prognosis for Safe Diet Advancement: Good      Swallow Study   General Date of Onset: 04/26/21 HPI: Per admitting H&P "Audrey Barron is a 70 y.o. female with no known medical history as she has not followed with a physician for a long time, who presented to the emergency room with a Audrey Barron of unsteady gait and recurrent falls as well as altered mental status with confusion.  She was found outside her home in her underpants.  No reported fever or chills.  No nausea or vomiting or abdominal pain.  No chest pain or palpitations.  No focal muscle weakness.  She denied any slurred speech or facial droop.  No urinary or stool incontinence.  No tinnitus or vertigo.  ED Course: When she came to the ER temperature was 99.5 and vital signs were otherwise within normal.  Labs revealed hyponatremia and hypochloremia and were otherwise N unremarkable.  COVID-19 PCR and influenza antigens came back negative.     EKG as reviewed by me : Showed normal sinus rhythm with a rate of 71 with possible left atrial enlargement and Q waves anteroseptally.  Imaging: Noncontrasted head CT scan revealed:  1. Large heterogeneous mass centered in the region of the left basal  ganglia, with significant surrounding vasogenic edema and mass  effect. MRI is recommended for further evaluation.  2. No acute infarct or hemorrhage.     Brain MRI with and without contrast revealed the following:  1. Multifocal abnormal contrast enhancement centered within the left  basal ganglia  and extending into the left temporal lobe, crossing  the midline at the level of the fornix. 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 The lack of necrosis is not characteristic of  glioblastoma, which remains the second consideration.  2. Rightward midline shift measuring 12 mm.     The patient will be admitted to a medical monitored bed for further evaluation and management." Type of Study: Bedside Swallow Evaluation Diet Prior to this Study: NPO Temperature Spikes Noted: No Respiratory Status: Room air History of Recent Intubation: No Behavior/Cognition: Alert;Cooperative;Pleasant mood;Requires cueing Oral Cavity Assessment: Within Functional Limits Oral Care Completed by SLP: No Oral Cavity - Dentition: Adequate natural dentition Self-Feeding Abilities: Needs assist;Needs set up Patient Positioning: Upright in bed Baseline Vocal Quality: Normal;Low vocal intensity    Oral/Motor/Sensory Function Overall Oral Motor/Sensory Function: Mild impairment Facial ROM: Within Functional Limits Facial Symmetry: Abnormal symmetry left Facial Strength: Within Functional Limits Facial Sensation: Within Functional Limits Lingual ROM: Within Functional Limits Lingual Symmetry: Within Functional Limits Lingual Strength: Within Functional Limits Lingual Sensation: Within Functional Limits Velum: Within Functional Limits Mandible: Within Functional Limits   Ice Chips Ice chips: Within functional limits Presentation: Spoon   Thin Liquid Thin Liquid: Within functional limits Presentation: Cup;Spoon;Straw    Nectar Thick Nectar Thick Liquid: Not tested  Honey Thick Honey Thick Liquid: Not tested   Puree Puree: Within functional limits Presentation: Spoon   Solid     Solid: Impaired Presentation: Self Fed Oral Phase Impairments: Poor awareness of bolus;Reduced lingual movement/coordination Oral Phase Functional Implications: Prolonged oral transit      Lucila Maine 05/01/2021,12:07  PM

## 2021-05-01 NOTE — Evaluation (Signed)
Occupational Therapy Evaluation Patient Details Name: Audrey Barron MRN: [id number] DOB: Mar 09, 1951 Today's Date: 05/01/2021    History of Present Illness 70 yo woman who presented to ER secondary to unsteady gait, recurrent falls, AMS; admitted for management of large intracranial mass (L BG extending to temporal lobe) with significant vasogenic edema and 79mm midline shift to R.  Hospital course significant for L temporal craniotomy for open biopsy (01/30/73), complicated by significant edema and SIADH post-op.   Clinical Impression   Pt seen for OT evaluation on this date. Upon arrival to room, pt asleep in bed, however easily awoken, with 1:1 sitter at bedside. Pt oriented to self only however pleasant and agreeable throughout session. With moderate verbal cues, pt able to attend to familiar ADL tasks, use ADL tools appropriately, and follow simple 1-step directions. Pt continues to demonstrate poor awareness of deficits, and cognition appears to be consistent with Level VI of [facility] Scale (confused, appropriate, moderate assistance). In addition to cognitive deficits, pt presents with decreased strength, balance, coordination, and activity tolerance, requiring MIN A for bed mobility and functional mobility of short household distances (~5 ft), MIN A for seated LB dressing, MIN A for standing hand hygiene, and MOD A to fasten buttons. Pt would benefit from additional skilled OT services to maximize return to PLOF and minimize risk of future falls, injury, caregiver burden, and readmission. Upon discharge, recommend CIR.    Follow Up Recommendations  CIR    Equipment Recommendations  Other (comment) (defer to next venue of care)       Precautions / Restrictions Precautions Precautions: Fall Precaution Comments: s/p craniotomy Restrictions Weight Bearing Restrictions: No      Mobility Bed Mobility Overal bed mobility: Needs Assistance Bed Mobility: Supine to Sit;Sit to  Supine     Supine to sit: Min assist;HOB elevated Sit to supine: Min assist;HOB elevated   General bed mobility comments: Requires MIN A for trunk suppport    Transfers Overall transfer level: Needs assistance Equipment used: 1 person hand held assist Transfers: Sit to/from Stand Sit to Stand: Min assist         General transfer comment: Requires assist for clearing hips from bed and stablizing while standing    Balance Overall balance assessment: Needs assistance Sitting-balance support: No upper extremity supported;Feet supported Sitting balance-Leahy Scale: Good Sitting balance - Comments: Good sitting balance while donning/doffing socks at EOB   Standing balance support: Bilateral upper extremity supported;During functional activity Standing balance-Leahy Scale: Poor Standing balance comment: MIN A required for steadying during standing hand hygiene                           ADL either performed or assessed with clinical judgement   ADL Overall ADL's : Needs assistance/impaired     Grooming: Wash/dry hands;Minimal assistance;Cueing for sequencing;Standing;Wash/dry face;Supervision/safety;Bed level               Lower Body Dressing: Minimal assistance;Cueing for safety;Cueing for compensatory techniques;Sitting/lateral leans Lower Body Dressing Details (indicate cue type and reason): to don/doff socks via figure-4 position             Functional mobility during ADLs: Minimal assistance (MIN A to walk ~5 feet w/out AD)       Vision   Additional Comments: Difficult to formally assess d/t cognition however pt appears to be able to track stimuli in all quadrants  Pertinent Vitals/Pain Pain Assessment: No/denies pain     Hand Dominance Right   Extremity/Trunk Assessment Upper Extremity Assessment Upper Extremity Assessment: Generalized weakness;Difficult to assess due to impaired cognition (Difficult to formally assess d/t  cognition, however grossly at least 3/5 in all movements. Strength R<L. Requires increase time for motor planning)   Lower Extremity Assessment Lower Extremity Assessment: Generalized weakness;Difficult to assess due to impaired cognition       Communication Communication Communication: Expressive difficulties (slightly 'thick' and dysarthric at times)   Cognition Arousal/Alertness: Awake/alert Behavior During Therapy: WFL for tasks assessed/performed Overall Cognitive Status: No family/caregiver present to determine baseline cognitive functioning                                 General Comments: Pt pleasant and agreeable throughout. Oriented to self only. With moderate verbal cues, pt able to attend to familiar ADL tasks, use materials for ADLs appropriately, and follow simple 1-step directions. Poor awareness of deficits. Cognition consistent with Level VI of [employer] Scale (confused, appropriate, moderate assistance)              Home Living Family/patient expects to be discharged to:: Private residence Living Arrangements: Children Available Help at Discharge: Family;Available PRN/intermittently Type of Home: House                           Additional Comments: Patient limited historian, will verify social history with family as available.      Prior Functioning/Environment Level of Independence: Independent        Comments: Patient limited historian, will verify social history with family as available. Per RN (received in report from daughter), patient baseline indep and living alone.  Has experienced progressive unsteadiness, recurrent falls and AMS in recent months.  Daughter moved in with patient at first of year to support/assist as needed.        OT Problem List: Decreased strength;Decreased activity tolerance;Impaired balance (sitting and/or standing);Decreased coordination;Decreased cognition;Decreased safety awareness;Decreased  knowledge of use of DME or AE;Decreased knowledge of precautions      OT Treatment/Interventions: Self-care/ADL training;Therapeutic exercise;Energy conservation;DME and/or AE instruction;Therapeutic activities;Patient/family education;Balance training    OT Goals(Current goals can be found in the care plan section) Acute Rehab OT Goals OT Goal Formulation: Patient unable to participate in goal setting Time For Goal Achievement: 05/15/21 ADL Goals Pt Will Transfer to Toilet: with min guard assist;stand pivot transfer;bedside commode Pt Will Perform Toileting - Clothing Manipulation and hygiene: with min guard assist;sitting/lateral leans Additional ADL Goal #1: Pt will independently initiate 1 familiar standing grooming task in 2/3 opportunities  OT Frequency: Min 3X/week    AM-PAC OT "6 Clicks" Daily Activity     Outcome Measure Help from another person eating meals?: A Little Help from another person taking care of personal grooming?: A Little Help from another person toileting, which includes using toliet, bedpan, or urinal?: A Lot Help from another person bathing (including washing, rinsing, drying)?: A Lot Help from another person to put on and taking off regular upper body clothing?: A Little Help from another person to put on and taking off regular lower body clothing?: A Little 6 Click Score: 16   End of Session Equipment Utilized During Treatment: Gait belt Nurse Communication: Mobility status  Activity Tolerance: Patient tolerated treatment well Patient left: in bed;with call bell/phone within reach;with nursing/sitter in room  OT Visit  Diagnosis: Muscle weakness (generalized) (M62.81);Unsteadiness on feet (R26.81);History of falling (Z91.81);Other symptoms and signs involving cognitive function                Time: 9323-5573 OT Time Calculation (min): 24 min Charges:  OT General Charges $OT Visit: 1 Visit OT Evaluation $OT Eval Moderate Complexity: 1 Mod OT  Treatments $Self Care/Home Management : 8-22 mins  Fredirick Maudlin, OTR/L [place]

## 2021-05-01 NOTE — Progress Notes (Signed)
Did not do MN VS on patient given her agitation, confusion and impulsiveness. We have just calmed her down to rest. Will not wake her as there have been no acute changes in her Resp status or HR on the monitor.

## 2021-05-01 NOTE — Consult Note (Signed)
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 Felber is a 70 year old female with past medical history significant for ovarian cancer who recently presented to the emergency room on 04/26/2021 with recurrent falls, confusion, slurred speech and facial droop. MR of her brain revealed large left enhancing lesion concerning for neoplasm.  Additional work-up to exclude mets or other primary included CT chest abdomen pelvis which was all negative.  She was evaluated by neurosurgery who then proceeded with open biopsy on 04/28/21-biopsy pending at this time.  She was placed on Keppra and steroids for seizure prophylaxis.  She was transferred to ICU for close monitoring.  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  While hospitalized, she had some delirium and was placed on Seroquel.  She was transferred from ICU to 1C this afternoon (05/01/21).  INTERVAL HISTORY: Patient continues to recover from craniotomy. Labs have improved.  Still awaiting biopsy results.    REVIEW OF SYSTEMS:   Review of Systems  Constitutional: Negative.  Negative for chills, fever, malaise/fatigue and weight loss.  HENT: Negative for congestion, ear pain and tinnitus.   Eyes: Negative.  Negative for blurred vision and double vision.  Respiratory: Negative.  Negative for cough, sputum production and shortness of breath.   Cardiovascular: Negative.  Negative for chest pain, palpitations and leg swelling.  Gastrointestinal: Negative.  Negative for abdominal pain, constipation, diarrhea, nausea and vomiting.  Genitourinary: Negative for dysuria, frequency and urgency.  Musculoskeletal: Negative for back pain and falls.   Skin: Negative.  Negative for rash.  Neurological: Positive for weakness. Negative for headaches.  Endo/Heme/Allergies: Negative.  Does not bruise/bleed easily.  Psychiatric/Behavioral: Positive for memory loss. Negative for depression. The patient is not nervous/anxious and does not have insomnia.     As per HPI. Otherwise, a complete review of systems is negative.  PAST MEDICAL HISTORY: History reviewed. No pertinent past medical history.  PAST SURGICAL HISTORY: Past Surgical History:  Procedure Laterality Date  . APPLICATION OF CRANIAL NAVIGATION N/A 04/28/2021   Procedure: APPLICATION OF CRANIAL NAVIGATION;  Surgeon: Meade Maw, MD;  Location: [facility] ORS;  Service: Neurosurgery;  Laterality: N/A;  . CRANIOTOMY Left 04/28/2021   Procedure: CRANIOTOMY TUMOR EXCISION;  Surgeon: Meade Maw, MD;  Location: [facility] ORS;  Service: Neurosurgery;  Laterality: Left;    FAMILY HISTORY: History reviewed. No pertinent family history.  ADVANCED DIRECTIVES N  HEALTH MAINTENANCE: Social History   Tobacco Use  . Smoking status: Never Smoker  . Smokeless tobacco: Never Used  Substance Use Topics  . Alcohol use: Not Currently  . Drug use: Not Currently     Colonoscopy:  PAP:  Bone density:  Lipid panel:  Not on File  Current Facility-Administered Medications  Medication Dose Route Frequency Provider Last Rate Last Admin  . acetaminophen (TYLENOL) tablet 650 mg  650 mg Per Tube Q4H PRN Meade Maw, MD       Or  . acetaminophen (TYLENOL) suppository 650 mg  650 mg Rectal Q4H PRN Meade Maw, MD      . Chlorhexidine Gluconate Cloth 2 % PADS 6 each  6 each Topical Daily Meade Maw, MD   6 each at 05/01/21 [phone]  . dexamethasone (DECADRON) injection 4 mg  4 mg Intravenous Q8H Deetta Perla,  MD   4 mg at 05/01/21 1359  . diphenhydrAMINE (BENADRYL) injection 50 mg  50 mg Intravenous QHS PRN Rust-Chester, Britton L, NP   50 mg at 05/01/21 0018  . enoxaparin  (LOVENOX) injection 40 mg  40 mg Subcutaneous Q24H Meade Maw, MD   40 mg at 05/01/21 7416  . hydrALAZINE (APRESOLINE) injection 10 mg  10 mg Intravenous Q4H PRN Meade Maw, MD   10 mg at 04/28/21 1432  . HYDROcodone-acetaminophen (NORCO/VICODIN) 5-325 MG per tablet 1 tablet  1 tablet Per Tube Q4H PRN Meade Maw, MD   1 tablet at 04/29/21 1246  . labetalol (NORMODYNE) injection 10-40 mg  10-40 mg Intravenous Q10 min PRN Meade Maw, MD      . levETIRAcetam (KEPPRA) IVPB 500 mg/100 mL premix  500 mg Intravenous Q12H Meade Maw, MD   Stopped at 05/01/21 1416  . ondansetron (ZOFRAN) tablet 4 mg  4 mg Oral Q4H PRN Meade Maw, MD       Or  . ondansetron [facility]) injection 4 mg  4 mg Intravenous Q4H PRN Meade Maw, MD      . pantoprazole (PROTONIX) injection 40 mg  40 mg Intravenous QHS Meade Maw, MD   40 mg at 04/30/21 2021  . polyethylene glycol (MIRALAX / GLYCOLAX) packet 17 g  17 g Per Tube Daily PRN Meade Maw, MD      . promethazine (PHENERGAN) tablet 12.5-25 mg  12.5-25 mg Oral Q4H PRN Meade Maw, MD      . QUEtiapine (SEROQUEL) tablet 50 mg  50 mg Oral QHS Sharen Hones, MD      . senna-docusate (Senokot-S) tablet 2 tablet  2 tablet Per Tube BID Meade Maw, MD   2 tablet at 04/29/21 0941    OBJECTIVE: Vitals:   05/01/21 0843 05/01/21 1118  BP: (!) 128/95 (!) 138/99  Pulse: 64 65  Resp: 16 16  Temp: (!) 97.5 F (36.4 C) (!) 97.5 F (36.4 C)  SpO2: 99% 98%     Body mass index is 21.63 kg/m.    ECOG FS:2 - Symptomatic, <50% confined to bed  Physical Exam Constitutional:      Appearance: Normal appearance.  HENT:     Head: Normocephalic and atraumatic.  Eyes:     Pupils: Pupils are equal, round, and reactive to light.  Cardiovascular:     Rate and Rhythm: Normal rate and regular rhythm.     Heart sounds: Normal heart sounds. No murmur heard.   Pulmonary:     Effort: Pulmonary effort is  normal.     Breath sounds: Normal breath sounds. No wheezing.  Abdominal:     General: Bowel sounds are normal. There is no distension.     Palpations: Abdomen is soft.     Tenderness: There is no abdominal tenderness.  Musculoskeletal:        General: Normal range of motion.     Cervical back: Normal range of motion.  Skin:    General: Skin is warm and dry.     Findings: No rash.  Neurological:     Mental Status: She is easily aroused. She is confused.     Comments: Air cabin crew at bedside. Oriented to pesron and place  Psychiatric:        Judgment: Judgment normal.      LAB RESULTS:  Lab Results  Component Value Date   NA 144 05/01/2021   K 3.7 05/01/2021   CL 111 05/01/2021   CO2 26 05/01/2021  GLUCOSE 101 (H) 05/01/2021   BUN 20 05/01/2021   CREATININE 0.60 05/01/2021   CALCIUM 9.2 05/01/2021   PROT 6.9 04/28/2021   ALBUMIN 3.8 04/28/2021   AST 19 04/28/2021   ALT 18 04/28/2021   ALKPHOS 25 (L) 04/28/2021   BILITOT 1.2 04/28/2021   GFRNONAA >60 05/01/2021    Lab Results  Component Value Date   WBC 10.2 05/01/2021   HGB 13.4 05/01/2021   HCT 40.0 05/01/2021   MCV 87.1 05/01/2021   PLT 197 05/01/2021     STUDIES: DG Abd 1 View  Result Date: 04/28/2021 CLINICAL DATA:  NG tube placement EXAM: ABDOMEN - 1 VIEW COMPARISON:  CT 04/27/2021 FINDINGS: Esophageal tube tip in the left upper quadrant over the mid stomach. Mild diffuse increased bowel gas without obstructive pattern. IMPRESSION: Esophageal tube tip overlies the proximal to mid stomach. Electronically Signed   By: Donavan Foil M.D.   On: 04/28/2021 21:20   CT HEAD WO CONTRAST  Result Date: 04/28/2021 CLINICAL DATA:  Post biopsy EXAM: CT HEAD WITHOUT CONTRAST TECHNIQUE: Contiguous axial images were obtained from the base of the skull through the vertex without intravenous contrast. COMPARISON:  04/26/2021 FINDINGS: Brain: Small peripheral biopsy sites is present along the superolateral left temporal  lobe containing air and blood products. Minimal extra-axial air is present along the cerebral convexity. Stable appearance of relatively hyperattenuating lesion centered within the left basal ganglia with contralateral extension and surrounding hypoattenuation corresponding to abnormal signal on MRI. There is stable rightward midline shift and trapping of the right lateral ventricle as well as partial effacement of basal cisterns. No new loss of gray-white differentiation. Vascular: No new findings. Skull: New small left craniotomy. Sinuses/Orbits: No acute abnormality Other: None. IMPRESSION: Expected post biopsy changes. Otherwise stable appearance of mass and associated mass effect. Electronically Signed   By: Macy Mis M.D.   On: 04/28/2021 15:52   CT Head Wo Contrast  Result Date: 04/26/2021 CLINICAL DATA:  Disoriented, multiple falls, facial droop and weakness from previous stroke EXAM: CT HEAD WITHOUT CONTRAST TECHNIQUE: Contiguous axial images were obtained from the base of the skull through the vertex without intravenous contrast. COMPARISON:  None. FINDINGS: Brain: There is a large mass centered in the region of the left basal ganglia, measuring approximately 4.3 x 3.5 cm. Marked surrounding vasogenic edema throughout the left cerebral hemisphere, with marked mass effect and rightward midline shift measuring approximately 14 mm at the level of the septum pellucidum. There is effacement the basilar cisterns. MRI is recommended for further evaluation. No acute hemorrhage. Marked effacement of the lateral ventricles by the mass effect described above. No acute extra-axial fluid collections. Vascular: No hyperdense vessel or unexpected calcification. Skull: Normal. Negative for fracture or focal lesion. Sinuses/Orbits: No acute finding. Other: None. IMPRESSION: 1. Large heterogeneous mass centered in the region of the left basal ganglia, with significant surrounding vasogenic edema and mass effect. MRI  is recommended for further evaluation. 2. No acute infarct or hemorrhage. Critical Value/emergent results were called by telephone at the time of interpretation on 04/26/2021 at 11:15 pm to provider [facility] , who verbally acknowledged these results. Electronically Signed   By: Randa Ngo M.D.   On: 04/26/2021 23:15   MR Brain W and Wo Contrast  Result Date: 04/27/2021 CLINICAL DATA:  Brain mass EXAM: MRI HEAD WITHOUT AND WITH CONTRAST TECHNIQUE: Multiplanar, multiecho pulse sequences of the brain and surrounding structures were obtained without and with intravenous contrast. CONTRAST:  59m GADAVIST  GADOBUTROL 1 MMOL/ML IV SOLN COMPARISON:  Head CT same day FINDINGS: Brain: There is multifocal abnormal contrast enhancement centered within the left basal ganglia and extending into the left temporal lobe and crossing the midline at the level of the fornix. There is a large amount of surrounding hyperintense T2-weighted signal. At the level of the foramina of [place], there is rightward midline shift measuring 12 mm. The contrast enhancement pattern is predominantly solid. Vascular: Major flow voids are preserved. Skull and upper cervical spine: Normal calvarium and skull base. Visualized upper cervical spine and soft tissues are normal. Sinuses/Orbits:No paranasal sinus fluid levels or advanced mucosal thickening. No mastoid or middle ear effusion. Normal orbits. IMPRESSION: 1. Multifocal abnormal contrast enhancement centered within the left basal ganglia and extending into the left temporal lobe, crossing the midline at the level of the fornix. 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 The lack of necrosis is not characteristic of glioblastoma, which remains the second consideration. 2. Rightward midline shift measuring 12 mm. Electronically Signed   By: Ulyses Jarred M.D.   On: 04/27/2021 00:37   CT CHEST ABDOMEN PELVIS W CONTRAST  Result Date: 04/27/2021 CLINICAL DATA:  Metastatic disease evaluation,  history of possible lymphoma in a 70 year old female found to have abnormality on brain imaging. EXAM: CT CHEST, ABDOMEN, AND PELVIS WITH CONTRAST TECHNIQUE: Multidetector CT imaging of the chest, abdomen and pelvis was performed following the standard protocol during bolus administration of intravenous contrast. CONTRAST:  81m OMNIPAQUE IOHEXOL 300 MG/ML  SOLN COMPARISON:  Only brain imaging is available for comparison. FINDINGS: CT CHEST FINDINGS Cardiovascular: Scattered aortic atherosclerosis. No aneurysmal dilation of the thoracic aorta. Heart size normal without pericardial effusion. Central pulmonary vasculature is normal caliber. Mediastinum/Nodes: Esophagus mildly patulous. No thoracic inlet lymphadenopathy. No axillary lymphadenopathy. No hilar or mediastinal lymphadenopathy. Lungs/Pleura: No consolidation or pleural effusion. Atelectasis at the lung bases. Airways are patent. No suspicious mass or nodule. Musculoskeletal: See below for full musculoskeletal details. CT ABDOMEN PELVIS FINDINGS Hepatobiliary: Low-density lesions in the liver, for instance on image 52 of series 2, well-circumscribed area in the RIGHT hepatic lobe measuring approximately 11 mm and another subcentimeter area in the anterior LEFT hepatic lobe, an additional 1.7 cm lesion in the posterior RIGHT hepatic lobe clearly with water density and well-circumscribed margins. These are compatible with cysts. No pericholecystic stranding. The portal vein is patent. No biliary duct dilation. Pancreas: Normal, without mass, inflammation or ductal dilatation. Spleen: Normal spleen. Adrenals/Urinary Tract: Adrenal glands are normal. Symmetric renal enhancement. No suspicious renal lesion. No hydronephrosis. The urinary bladder is under distended limiting assessment. Stomach/Bowel: Mild gastric thickening versus under distension. Small bowel is normal caliber without signs of adjacent inflammation or visible lesion, limited assessment due to  lack of contrast. No signs of colonic obstruction or acute colonic inflammation. No pericecal stranding. Appendix is normal. Vascular/Lymphatic: Tortuous nonaneurysmal abdominal aorta with mild atherosclerotic plaque. Mildly dilated LEFT common iliac artery up to 1.5 cm also tortuous. Smooth contour of the IVC. There is no gastrohepatic or hepatoduodenal ligament lymphadenopathy. No retroperitoneal or mesenteric lymphadenopathy. Engorgement of LEFT ovarian vein. No pelvic sidewall lymphadenopathy. Reproductive: Engorgement of parametrial veins and LEFT ovarian vein as described. LEFT renal vein is patent. Other: No ascites. Musculoskeletal: No acute bone finding. No destructive bone process. Spinal degenerative changes. IMPRESSION: 1. No adenopathy in the chest, abdomen or pelvis. No signs of neoplasm. 2. Mild gastric thickening versus under distension. Correlate with any symptoms of gastritis. 3. Engorgement of parametrial veins and  LEFT ovarian vein, nonspecific but can be seen in the setting of pelvic congestion syndrome. 4. Aortic atherosclerosis. Aortic Atherosclerosis (ICD10-I70.0) and Emphysema (ICD10-J43.9). Electronically Signed   By: Zetta Bills M.D.   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  -Awaiting biopsy results from craniotomy from 04/28/2021.  -If preliminary/frozen suggestive of CNS lymphoma she would need PET scan, bone marrow biopsy and slit-lamp exam to rule out ocular involvement.  -If discharged before Friday, Dr. Mickeal Skinner would like to see her at [facility] to discuss plan and possible treatment options.  -We will get patient scheduled appointment prior to her discharge from the.    -Recommend continuing dexamethasone (42m TID) and Keppra (500 mg BID) for seizure  prophylaxis.  Disposition: -Once medically stable, patient to see Dr. VMickeal Skinner(neuro oncology) tentatively for Friday, 05/05/2021.  Try to call daughter JIleah Falkensteinbut went to voicemail.  Unable to leave a message.  We will try tomorrow.  Greater than 50% was spent in counseling and coordination of care with this patient including but not limited to discussion of the relevant topics above (See A&P) including, but not limited to diagnosis and management of acute and chronic medical conditions.   Patient expressed understanding and was in agreement with this plan. She also understands that She can call clinic at any time with any questions, concerns, or complaints.   Cancer Staging No matching staging information was found for the patient.  JJacquelin Hawking NP   05/01/2021 3:50 PM

## 2021-05-01 NOTE — Progress Notes (Addendum)
TOC consult for discharge options. Awaiting CIR rehab consult to see if patient will be able to go to CIR as recommended by PT. TOC will follow.  Oleh Genin, [place]

## 2021-05-01 NOTE — Progress Notes (Signed)
Neurosurgery Progress Note  DOS: 04/28/2021 - L temporal craniotomy for biopsy  History: Audrey Barron is here for left-sided brain tumor.    POD3: Patient continues to do well with steroids. Has no pain complaints today.  POD2: Patient has made substantial improvements. She is now awake, watching TV, and talking spontaneously.  POD1: Patient has made improvements overnight.  She is now interacting somewhat with staff. 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  After giving mannitol, 3% sodium, and tolvaptan, she has had a significant rise in her serum sodium, and subsequent improvement in neurological condition.  POD0: Patient is still recovering in ICU.  She is moving purposefully.  She is not responding to verbal commands.  Physical Exam: Vitals:   05/01/21 0405 05/01/21 0843  BP: 131/89 (!) 128/95  Pulse: 66 64  Resp:  16  Temp: 97.6 F (36.4 C) (!) 97.5 F (36.4 C)  SpO2: 98% 99%   Awake, alert She is now oriented to self and hospital spontaneously.  She does not know the year. She is speaking spontaneously, but making paraphasic errors.  She names 2/3.  Repetition improved with short phrases   Slight R facial weakness.  EOMI  5/5 on LUE and LLE Appears 5/5 in RUE and RLE more proximal with slight weakness distally  Dressing removed - incision c/d/i  Data:  Recent Labs  Lab 04/30/21 1140 04/30/21 1643 05/01/21 0510  NA 145 145 144  K 4.0 4.0 3.7  CL 113* 111 111  CO2 25 27 26   BUN 20 19 20   CREATININE 0.64 0.77 0.60  GLUCOSE 121* 102* 101*  CALCIUM 9.3 9.3 9.2   Recent Labs  Lab 04/28/21 2245  AST 19  ALT 18  ALKPHOS 25*     Recent Labs  Lab 04/28/21 0326 04/30/21 0141 05/01/21 0510  WBC 8.8 13.3* 10.2  HGB 14.1 14.3 13.4  HCT 40.3 42.0 40.0  PLT 213 229 197   No results for input(s): APTT, INR in the last 168 hours.      Other tests/results: HCT 04/28/21 with significant swelling but no  hemorrhage  Assessment/Plan:  Audrey Barron is doing much better POD2 after biopsy.   She suffered from SIADH around the time of surgery, but is now much improved with steroids and hypertonic saline (stopped in early AM on 04/29/21). She has had no additional treatment other than steroids in the past 24 hours.  - neuro checks q4 hours - DVT prophylaxis with lovenox - Follow up pathology - Dexamethasone to 4q8 today - Speech eval for swallowing. If passes, transition all meds to PO - Monitor BMP daily  - Would recommend medical and radiation oncology consults to discuss any other testing necessary given prelim pathology concern for lymphoma  Deetta Perla, MD Department of Neurosurgery

## 2021-05-01 NOTE — Progress Notes (Signed)
Physical Therapy Treatment Patient Details Name: Audrey Barron MRN: [id number] DOB: 1951-07-17 Today's Date: 05/01/2021,     History of Present Illness 70 yo woman who presented to ER secondary to unsteady gait, recurrent falls, AMS; admitted for management of large intracranial mass (L BG extending to temporal lobe) with significant vasogenic edema and 70mm midline shift to R.  Hospital course significant for L temporal craniotomy for open biopsy (0/1/09), complicated by significant edema and SIADH post-op.    PT Comments    Patient participates well with skilled interventions.  Family in during session to observe performance and level of assist currently requiring. Tolerating increased gait distance with mod manual assist for L ant/laeral weight shift, dynamic balance and overall safety.  May benefit from trial of assist device, though anticipate difficulty with sustained R UE grasp/placement on device.  Mild/mod expressive and receptive aphasia noted during session today; moderate R visual field inattention; mild/mod ideational apraxia. Anticipate significant physical and cognitive assist upon discharge; would benefit greatly from transition to CIR at discharge to optimize functional indep and decrease caregiver burden upon return home. Did discuss benefit of CIR with patient and daughter today. Daughter seems very committed to taking patient home; encouraged consideration of CIR if accepted, pending pathology report and upcoming medical decisions.    Follow Up Recommendations  CIR     Equipment Recommendations       Recommendations for Other Services       Precautions / Restrictions Precautions Precautions: Fall Restrictions Weight Bearing Restrictions: No    Mobility  Bed Mobility Overal bed mobility: Needs Assistance Bed Mobility: Supine to Sit;Sit to Supine     Supine to sit: Supervision          Transfers Overall transfer level: Needs assistance Equipment  used: 1 person hand held assist Transfers: Sit to/from Stand Sit to Stand: Min assist         General transfer comment: assist for balance with movement transition  Ambulation/Gait Ambulation/Gait assistance: Mod assist Gait Distance (Feet): 100 Feet Assistive device: 1 person hand held assist       General Gait Details: short, shuffling steps; mod manual faciltiation for L ant/lateral weight shift to fully unweight/clear/advance R LE.  Increased sway in all directions; limited balance reactions in all planes. Unsafe to attempt without constant hands-on assist. May benefit from trial of RW, though anticipate difficulty sustaining R UE grasp on RW   Stairs             Wheelchair Mobility    Modified Rankin (Stroke Patients Only)       Balance Overall balance assessment: Needs assistance Sitting-balance support: No upper extremity supported;Feet supported Sitting balance-Leahy Scale: Good     Standing balance support: Single extremity supported Standing balance-Leahy Scale: Poor                              Cognition Arousal/Alertness: Awake/alert Behavior During Therapy: Impulsive;WFL for tasks assessed/performed                                   General Comments: Pleasant and cooperative throughout session; consistently follows single-step commands, but generlaly impulsive with poor awareness of deficits and safety needs      Exercises Other Exercises Other Exercises: High-sitting edge of bed, participated with dynamic reaching task using R UE to grasp/maniplate items placed on tray table  to R of midline.  Visual feedback required for optimal coordination/motor control of  R UE; mod/max verbal cuing for visual scanning and item identification to far R visual field.  Noting some degree of both expressive and receptive aphasia when locating/verbalizing specific items on tray table; moderate ideational apraxia noted when demonstrating use  of items at time.    General Comments        Pertinent Vitals/Pain Pain Assessment: No/denies pain    Home Living                      Prior Function            PT Goals (current goals can now be found in the care plan section) Acute Rehab PT Goals Patient Stated Goal: to return home PT Goal Formulation: With patient Time For Goal Achievement: 05/14/21 Potential to Achieve Goals: Fair Progress towards PT goals: Progressing toward goals    Frequency    7X/week      PT Plan Current plan remains appropriate    Co-evaluation              AM-PAC PT "6 Clicks" Mobility   Outcome Measure  Help needed turning from your back to your side while in a flat bed without using bedrails?: None Help needed moving from lying on your back to sitting on the side of a flat bed without using bedrails?: None Help needed moving to and from a bed to a chair (including a wheelchair)?: A Lot Help needed standing up from a chair using your arms (e.g., wheelchair or bedside chair)?: A Lot Help needed to walk in hospital room?: A Lot Help needed climbing 3-5 steps with a railing? : A Lot 6 Click Score: 16    End of Session Equipment Utilized During Treatment: Gait belt Activity Tolerance: Patient tolerated treatment well Patient left: in bed;with call bell/phone within reach;with nursing/sitter in room;with family/visitor present Nurse Communication: Mobility status PT Visit Diagnosis: Muscle weakness (generalized) (M62.81);Hemiplegia and hemiparesis;Difficulty in walking, not elsewhere classified (R26.2) Hemiplegia - Right/Left: Right Hemiplegia - dominant/non-dominant: Dominant Hemiplegia - caused by: Other cerebrovascular disease     Time: 1143-1215 PT Time Calculation (min) (ACUTE ONLY): 32 min  Charges:  $Gait Training: 8-22 mins $Neuromuscular Re-education: 8-22 mins                     Deborah Dondero H. Owens Shark, PT, DPT, NCS 05/01/21, 10:57 PM [phone]

## 2021-05-02 ENCOUNTER — Telehealth: Payer: Self-pay | Admitting: Oncology

## 2021-05-02 DIAGNOSIS — G9389 Other specified disorders of brain: Secondary | ICD-10-CM

## 2021-05-02 MED ORDER — PANTOPRAZOLE SODIUM 40 MG PO TBEC: 40.0000 mg | DELAYED_RELEASE_TABLET | Freq: Every day | ORAL | 0 refills | Status: DC

## 2021-05-02 MED ORDER — DIPHENHYDRAMINE HCL 25 MG PO CAPS: 50.0000 mg | ORAL_CAPSULE | Freq: Every evening | ORAL | Status: DC | PRN

## 2021-05-02 MED ORDER — LEVETIRACETAM 500 MG PO TABS
500.0000 mg | ORAL_TABLET | Freq: Two times a day (BID) | ORAL | Status: DC
  Administered 2021-05-02: 11:00:00 500 mg via ORAL
  Filled 2021-05-02 (×2): qty 1

## 2021-05-02 MED ORDER — DIPHENHYDRAMINE HCL 50 MG PO CAPS: 50.0000 mg | ORAL_CAPSULE | Freq: Every evening | ORAL | 0 refills | Status: AC | PRN

## 2021-05-02 MED ORDER — LEVETIRACETAM 500 MG PO TABS: 500.0000 mg | ORAL_TABLET | Freq: Two times a day (BID) | ORAL | 0 refills | Status: DC

## 2021-05-02 MED ORDER — PANTOPRAZOLE SODIUM 40 MG PO TBEC: 40.0000 mg | DELAYED_RELEASE_TABLET | Freq: Every day | ORAL | Status: DC

## 2021-05-02 MED ORDER — DEXAMETHASONE 4 MG PO TABS: 4.0000 mg | ORAL_TABLET | Freq: Three times a day (TID) | ORAL | 0 refills | Status: DC

## 2021-05-02 NOTE — Progress Notes (Signed)
PHARMACIST - PHYSICIAN COMMUNICATION  DR:  Ro[place]  CONCERNING: IV to Oral Route Change Policy  RECOMMENDATION: This patient is receiving keppra, pantoprazole, and benadryl PRN by the intravenous route.  Based on criteria approved by the Pharmacy and Therapeutics Committee, the intravenous medication(s) is/are being converted to the equivalent oral dose form(s).   DESCRIPTION: These criteria include:  The patient is eating (either orally or via tube) and/or has been taking other orally administered medications for a least 24 hours  The patient has no evidence of active gastrointestinal bleeding or impaired GI absorption (gastrectomy, short bowel, patient on TNA or NPO).  If you have questions about this conversion, please contact the Pharmacy Department  []   [phone] )  [facility] [x]   [phone] )  [facility] []   [phone] )  [facility] []   [phone] )  [facility] []   [phone] )  [place], [facility] 05/02/2021 10:08 AM

## 2021-05-02 NOTE — Care Management Important Message (Signed)
Important Message  Patient Details  Name: Audrey Barron MRN: [id number] Date of Birth: [birth year]   Medicare Important Message Given:  Yes     Juliann Pulse A Miko Sirico 05/02/2021, 10:48 AM

## 2021-05-02 NOTE — Progress Notes (Signed)
Pt being discharged home, discharge instructions reviewed with pt and daughter Anderson Malta, states understanding, pt with no complaints

## 2021-05-02 NOTE — Progress Notes (Signed)
    Durable Medical Equipment  (From admission, onward)         Start     Ordered   05/02/21 1055  For home use only DME Hospital bed  Once       Question Answer Comment  Length of Need 6 Months   Patient has (list medical condition): S/P craniotomy for brain mass, seizure precautions, risk of skin breakdown due to incontinence   The above medical condition requires: Patient requires the ability to reposition frequently   Head must be elevated greater than: 30 degrees   Bed type Semi-electric   Support Surface: Gel Overlay      05/02/21 1055   05/02/21 1008  For home use only DME Walker rolling  Once       Question Answer Comment  Walker: With [place]   Patient needs a walker to treat with the following condition Brain mass      05/02/21 1007

## 2021-05-02 NOTE — Progress Notes (Signed)
Inpatient Rehab Admissions Coordinator:   Note plans for home with daughter and home health therapy follow up.  Will sign off for CIR at this time.   Shann Medal, PT, DPT Admissions Coordinator [phone] 05/02/21  12:06 PM

## 2021-05-02 NOTE — Telephone Encounter (Signed)
Re: follow-up-appts  Called and spoke with patient's daughter Anderson Malta to make sure she was aware of appointment with Dr. Mickeal Skinner on Friday, 05/05/2021 at 11 AM.  Verified date and time and location of [facility].  She is scheduled to be discharged from the hospital this afternoon.  All questions were answered.  Faythe Casa, NP 05/02/2021 1:30 PM

## 2021-05-02 NOTE — Progress Notes (Signed)
Occupational Therapy Treatment Patient Details Name: Audrey Barron MRN: [id number] DOB: 15-Aug-1951 Today's Date: 05/02/2021    History of present illness 70 yo woman who presented to ER secondary to unsteady gait, recurrent falls, AMS; admitted for management of large intracranial mass (L BG extending to temporal lobe) with significant vasogenic edema and 60mm midline shift to R.  Hospital course significant for L temporal craniotomy for open biopsy (0/1/02), complicated by significant edema and SIADH post-op.   OT comments  Pt seen for OT treatment on this date. Upon arrival to room, pt awake and seated upright in bed with sitter present. Pt oriented to self only. Pt continues to present with impaired cognition, strength, balance, and activity tolerance, and requires MIN GUARD for standing grooming tasks, MIN A for toilet transfers, and MIN A for functional mobility of short household distances with RW.  With MIN verbal cues, pt able to attend to familiar ADL tasks, use materials for ADLs appropriately, and follow simple 1-step directions. Of note, pt has difficulty remember names to common household items (i.e., toothbrush, rug, table, cat, wine glass). Additionally, pt continues to demonstrate poor awareness of deficits and safety awareness; this date, pt identified only 25% of fall hazards presented on fall hazard worksheet.  Additionally, pt required significant time to process clock-drawing task and required MIN verbal cues to identify mistakes (i.e., adding a 13th hour and forgetting hour hand).   Pt is making good progress toward goals, however continues to benefit from skilled OT services to maximize return to PLOF and minimize risk of future falls, injury, caregiver burden, and readmission. OT highly recommends short term rehab, however if pt returns home, recommend 24/7 supervision/assistance from family.   Follow Up Recommendations  CIR    Equipment Recommendations  Other  (comment) (2ww)       Precautions / Restrictions Precautions Precautions: Fall Precaution Comments: s/p craniotomy Restrictions Weight Bearing Restrictions: No       Mobility Bed Mobility Overal bed mobility: Needs Assistance Bed Mobility: Supine to Sit;Sit to Supine     Supine to sit: Supervision;HOB elevated Sit to supine: Supervision;HOB elevated      Transfers Overall transfer level: Needs assistance Equipment used: Rolling walker (2 wheeled) Transfers: Sit to/from Stand Sit to Stand: Min guard         General transfer comment: Verbal cues for safe hand placement    Balance Overall balance assessment: Needs assistance Sitting-balance support: No upper extremity supported;Feet supported Sitting balance-Leahy Scale: Good Sitting balance - Comments: Good sitting balance while reaching beyond BOS   Standing balance support: Bilateral upper extremity supported;During functional activity Standing balance-Leahy Scale: Poor Standing balance comment: MIN A required to walk short household distances with RW                           ADL either performed or assessed with clinical judgement   ADL Overall ADL's : Needs assistance/impaired Eating/Feeding: Supervision/ safety;Set up;Sitting Eating/Feeding Details (indicate cue type and reason): pt able to bring cup to mouth to take small sip of water Grooming: Wash/dry face;Oral care;Min guard;Set up;Standing Grooming Details (indicate cue type and reason): Pt required verbal cues to spit toothpaste into sink. Pt with difficutly grasping toothbrush, dropping x1, however was able to brush teeth after initiating a modified grasp                 Toilet Transfer: Minimal assistance;Ambulation;Regular Toilet;Grab bars;RW Armed forces technical officer Details (indicate cue type and  reason): Verbal cues for safe hand placement         Functional mobility during ADLs: Minimal assistance;Rolling walker (MIN A to walk to/from  bathroom with RW. MOD verbal cues required throughout to manage RW)                 Cognition Arousal/Alertness: Awake/alert Behavior During Therapy: WFL for tasks assessed/performed Overall Cognitive Status: No family/caregiver present to determine baseline cognitive functioning                                 General Comments: Pt oriented to self only. With MIN verbal cues, pt able to attend to familiar ADL tasks, use materials for ADLs appropriately, and follow simple 1-step directions. Pt continues to demonstrate poor awareness of deficits. Pt required significant time to process clock-drawing task and requiring MIN verbal cues to identify mistakes (i.e., adding a 13th hour and forgetting hour hand). Pt able to identify 25% of fall hazards presented to her on fall hazard worksheet. Pt with difficulty remember names to common household items (i.e., toothbrush, rug, table, cat, wine glass).              General Comments pt's cognition is poor with poor insight to deficits and abilities. poor safety awareness throughout. Highly recommend DC to CIR but pt/pt's daughter want to return home.    Pertinent Vitals/ Pain       Pain Assessment: No/denies pain         Frequency  Min 3X/week        Progress Toward Goals  OT Goals(current goals can now be found in the care plan section)  Progress towards OT goals: Progressing toward goals  Acute Rehab OT Goals Patient Stated Goal: to return home OT Goal Formulation: Patient unable to participate in goal setting Time For Goal Achievement: 05/15/21  Plan Discharge plan remains appropriate;Frequency remains appropriate       AM-PAC OT "6 Clicks" Daily Activity     Outcome Measure   Help from another person eating meals?: A Little Help from another person taking care of personal grooming?: A Little Help from another person toileting, which includes using toliet, bedpan, or urinal?: A Lot Help from another person  bathing (including washing, rinsing, drying)?: A Lot Help from another person to put on and taking off regular upper body clothing?: A Little Help from another person to put on and taking off regular lower body clothing?: A Little 6 Click Score: 16    End of Session Equipment Utilized During Treatment: Gait belt;Rolling walker  OT Visit Diagnosis: Muscle weakness (generalized) (M62.81);Unsteadiness on feet (R26.81);History of falling (Z91.81);Other symptoms and signs involving cognitive function   Activity Tolerance Patient tolerated treatment well   Patient Left in bed;with call bell/phone within reach;with nursing/sitter in room   Nurse Communication Mobility status        Time: 7741-2878 OT Time Calculation (min): 35 min  Charges: OT General Charges $OT Visit: 1 Visit OT Treatments $Self Care/Home Management : 23-37 mins  Fredirick Maudlin, OTR/L [place]

## 2021-05-02 NOTE — TOC Transition Note (Signed)
Transition of Care [facility]) - CM/SW Discharge Note   Patient Details  Name: Audrey Barron MRN: [id number] Date of Birth: 13-Nov-1951  [facility] of [facility]) CM/SW Contact:  Shelbie Hutching, RN Phone Number: 05/02/2021, 11:02 AM   Clinical Narrative:    Patient is medically cleared for discharge home with home health services.  PT did recommend CIR but patient's daughter wants the patient home with her and therapy to come out to the home.  Patient's daughter requests a RW and hospital bed, both ordered from [facility].  Gilford Rile will be delivered to the patient's room and hospital bed to the patient's home.  RNCM has reached out to [facility] and [facility], both are unable to accept patient.  Tommi Rumps with [facility] given referral for RN, PT, OT and aide- Tommi Rumps will check to see if they can accept referral.   Daughter will pick patient up today.     Final next level of care: Home w Home Health Services Barriers to Discharge: Barriers Resolved   Patient Goals and CMS Choice Patient states their goals for this hospitalization and ongoing recovery are:: Daughter wants to take patient home with home health services, and would like a hospital bed CMS Medicare.gov Compare Post Acute Care list provided to:: Patient Represenative (must comment) Choice offered to / list presented to : Adult Children  Discharge Placement                       Discharge Plan and Services   Discharge Planning Services: CM Consult Post Acute Care Choice: NA          DME Arranged: Walker rolling,Hospital bed DME Agency: [facility],NA Date DME Agency Contacted: 05/02/21 Time DME Agency Contacted: [id number] Representative spoke with at DME Agency: [place]: RN,PT,OT,Nurse's [place]: [place] Date [place]: 05/02/21 Time [place]: 41 Representative spoke with at [place]: [place] ([place]) Interventions     Readmission Risk  Interventions No flowsheet data found.

## 2021-05-02 NOTE — TOC Progression Note (Signed)
Transition of Care [facility]) - Progression Note    Patient Details  Name: Audrey Barron MRN: [id number] Date of Birth: 08-03-1951  Transition of Care [facility]) CM/SW Contact  Shelbie Hutching, RN Phone Number: 05/02/2021, 1:16 PM  Clinical Narrative:    Daughter is on her way to pick up the patient now.  Daughter also wanted to get a 3 in 1 and incontinence supplies ordered.  Her Medicaid will cover the incontinence supplies and [facility] will provide.  Orders have been placed.     Expected Discharge Plan:  (TBD) Barriers to Discharge: Barriers Resolved  Expected Discharge Plan and Services Expected Discharge Plan:  (TBD)   Discharge Planning Services: CM Consult Post Acute Care Choice: NA Living arrangements for the past 2 months: Single Family Home Expected Discharge Date: 05/02/21               DME Arranged: Gilford Rile rolling,Hospital bed DME Agency: [facility],NA Date DME Agency Contacted: 05/02/21 Time DME Agency Contacted: 29 Representative spoke with at DME Agency: [place]: RN,PT,OT,Nurse's Aide [place]: [place] Date [place]: 05/02/21 Time [place]: 32 Representative spoke with at [place]: [place] ([place]) Interventions    Readmission Risk Interventions No flowsheet data found.

## 2021-05-02 NOTE — Progress Notes (Signed)
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  Hospital course significant for L temporal craniotomy for open biopsy (02/29/24), complicated by significant edema and SIADH post-op.    PT Comments    Pt was long sitting in bed with sitter present upon arriving. She is alert but disoriented with cognition deficits present.  Per CM," Daughter wants to take her home." Pt is extremely high fall risk due to lack of insight of deficits. Poor balance throughout. HIGHLY recommend use of AD at all times when OOB. Pt would greatly benefit from CIR at DC however planning to return home.CM working to find [place]. Author contacted pt's daughter to discuss assistance required to manage safely at home. Daughter states that she feels she can provide assistance needed. Acute PT will continue to follow and progress as able per POC.    Follow Up Recommendations  CIR     Equipment Recommendations  Rolling walker with 5" wheels;Other (comment);Hospital bed (pt's daughter request hospital bed)       Precautions / Restrictions Precautions Precautions: Fall Precaution Comments: s/p craniotomy    Mobility  Bed Mobility Overal bed mobility: Needs Assistance Bed Mobility: Supine to Sit;Sit to Supine     Supine to sit: Supervision Sit to supine: Min assist;HOB elevated   General bed mobility comments: pt was able to exit bed without assistance however HOB elevated and pt used bed rails. Min assist to progress LEs into bed from sitting EOB.    Transfers Overall transfer level: Needs assistance Equipment used: 1 person hand held assist Transfers: Sit to/from Stand Sit to Stand:  Min assist         General transfer comment: Pt was able to stand EOB with +1 UE support. Highly recommended to patient/pt's daughter use of RW at all times if pt does DC home this afternoon  Ambulation/Gait Ambulation/Gait assistance: Mod assist Gait Distance (Feet): 120 Feet Assistive device: 1 person hand held assist Gait Pattern/deviations: Drifts right/left;Staggering right;Staggering left;Narrow base of support;Scissoring;Step-through pattern Gait velocity: decreased   General Gait Details: pt extremely high fall risk. UNsafe to ambulate without AD/BUE support. Therapist contacted pt's daughter with suggestions that she use gait belt and AD at all times. Daughter feels she can safely manage pt's needs at home.       Balance Overall balance assessment: Needs assistance Sitting-balance support: No upper extremity supported;Feet supported Sitting balance-Leahy Scale: Good     Standing balance support: During functional activity Standing balance-Leahy Scale: Poor Standing balance comment: extremely poor balance       Cognition Arousal/Alertness: Awake/alert Behavior During Therapy: Impulsive;WFL for tasks assessed/performed Overall Cognitive Status: No family/caregiver present to determine baseline cognitive functioning      General Comments: Pleasant and cooperative throughout session; consistently follows single-step commands, but generlaly impulsive with poor awareness of deficits and safety needs         General Comments General comments (skin integrity, edema, etc.): pt's cognition is poor with poor insight to deficits and abilities. poor safety awareness throughout. Highly recommend DC to CIR but pt/pt's daughter want to return home.      Pertinent Vitals/Pain Pain Assessment: No/denies pain  PT Goals (current goals can now be found in the care plan section) Acute Rehab PT Goals Patient Stated Goal: to return home Progress towards PT goals:  Progressing toward goals    Frequency    7X/week      PT Plan Current plan remains appropriate       AM-PAC PT "6 Clicks" Mobility   Outcome Measure  Help needed turning from your back to your side while in a flat bed without using bedrails?: None Help needed moving from lying on your back to sitting on the side of a flat bed without using bedrails?: None Help needed moving to and from a bed to a chair (including a wheelchair)?: A Lot Help needed standing up from a chair using your arms (e.g., wheelchair or bedside chair)?: A Lot Help needed to walk in hospital room?: A Lot Help needed climbing 3-5 steps with a railing? : A Lot 6 Click Score: 16    End of Session Equipment Utilized During Treatment: Gait belt Activity Tolerance: Patient tolerated treatment well Patient left: in bed;with call bell/phone within reach;with nursing/sitter in room;with family/visitor present Nurse Communication: Mobility status PT Visit Diagnosis: Muscle weakness (generalized) (M62.81);Hemiplegia and hemiparesis;Difficulty in walking, not elsewhere classified (R26.2) Hemiplegia - Right/Left: Right Hemiplegia - dominant/non-dominant: Dominant     Time: 7169-6789 PT Time Calculation (min) (ACUTE ONLY): 25 min  Charges:  $Gait Training: 8-22 mins $Therapeutic Activity: 8-22 mins                     Julaine Fusi PTA 05/02/21, 10:59 AM

## 2021-05-02 NOTE — Consult Note (Signed)
Physical Medicine and Rehabilitation Consult Reason for Consult: Impaired mobility and ADLs s/p brain mass Referring Physician: Sharen Hones, MD   HPI: Audrey Barron is a 70 y.o. female who presented to ER with unsteady gait, recurrent falls, and was found to have a brain mass. She is ambulating 120 feet modA with therapy. Daughter at bedside plans to take her home shortly. Physical Medicine & Rehabilitation was consulted to discuss benefits of CIR with patient and daughter.   Review of Systems  Constitutional: Negative.   HENT: Negative.   Eyes: Negative.   Respiratory: Negative.   Cardiovascular: Negative.   Gastrointestinal: Negative.   Genitourinary: Negative.   Musculoskeletal: Negative.   Skin: Negative.   Neurological: Positive for weakness.  Endo/Heme/Allergies: Negative.   Psychiatric/Behavioral: Negative.    History reviewed. No pertinent past medical history. Past Surgical History:  Procedure Laterality Date  . APPLICATION OF CRANIAL NAVIGATION N/A 04/28/2021   Procedure: APPLICATION OF CRANIAL NAVIGATION;  Surgeon: Meade Maw, MD;  Location: [facility] ORS;  Service: Neurosurgery;  Laterality: N/A;  . CRANIOTOMY Left 04/28/2021   Procedure: CRANIOTOMY TUMOR EXCISION;  Surgeon: Meade Maw, MD;  Location: [facility] ORS;  Service: Neurosurgery;  Laterality: Left;   History reviewed. No pertinent family history. Social History:  reports that she has never smoked. She has never used smokeless tobacco. She reports previous alcohol use. She reports previous drug use. Allergies: Not on File Medications Prior to Admission  Medication Sig Dispense Refill  . aspirin 81 MG chewable tablet Chew 81 mg by mouth daily.    . cyanocobalamin 1000 MCG tablet Take 1,000 mcg by mouth daily.      Home: Home Living Family/patient expects to be discharged to:: Private residence Living Arrangements: Children Available Help at Discharge: Family,Available  PRN/intermittently Type of Home: House Additional Comments: Patient limited historian, will verify social history with family as available.  Functional History: Prior Function Level of Independence: Independent Comments: Patient limited historian, will verify social history with family as available. Per RN (received in report from daughter), patient baseline indep and living alone.  Has experienced progressive unsteadiness, recurrent falls and AMS in recent months.  Daughter moved in with patient at first of year to support/assist as needed. Functional Status:  Mobility: Bed Mobility Overal bed mobility: Needs Assistance Bed Mobility: Supine to Sit,Sit to Supine Supine to sit: Supervision,HOB elevated Sit to supine: Supervision,HOB elevated General bed mobility comments: pt was able to exit bed without assistance however HOB elevated and pt used bed rails. Min assist to progress LEs into bed from sitting EOB. Transfers Overall transfer level: Needs assistance Equipment used: Rolling walker (2 wheeled) Transfers: Sit to/from Stand Sit to Stand: Min guard General transfer comment: Verbal cues for safe hand placement Ambulation/Gait Ambulation/Gait assistance: Mod assist Gait Distance (Feet): 120 Feet Assistive device: 1 person hand held assist Gait Pattern/deviations: Drifts right/left,Staggering right,Staggering left,Narrow base of support,Scissoring,Step-through pattern General Gait Details: pt extremely high fall risk. UNsafe to ambulate without AD/BUE support. Therapist contacted pt's daughter with suggestions that she use gait belt and AD at all times. Daughter feels she can safely manage pt's needs at home. Gait velocity: decreased    ADL: ADL Overall ADL's : Needs assistance/impaired Eating/Feeding: Supervision/ safety,Set up,Sitting Eating/Feeding Details (indicate cue type and reason): pt able to bring cup to mouth to take small sip of water Grooming: Wash/dry face,Oral  care,Min guard,Set up,Standing Grooming Details (indicate cue type and reason): Pt required verbal cues to spit toothpaste into sink.  Pt with difficutly grasping toothbrush, dropping x1, however was able to brush teeth after initiating a modified grasp Lower Body Dressing: Minimal assistance,Cueing for safety,Cueing for compensatory techniques,Sitting/lateral leans Lower Body Dressing Details (indicate cue type and reason): to don/doff socks via figure-4 position Toilet Transfer: Minimal assistance,Ambulation,Regular Toilet,Grab bars,RW Toilet Transfer Details (indicate cue type and reason): Verbal cues for safe hand placement Functional mobility during ADLs: Minimal assistance,Rolling walker (MIN A to walk to/from bathroom with RW. MOD verbal cues required throughout to manage RW)  Cognition: Cognition Overall Cognitive Status: No family/caregiver present to determine baseline cognitive functioning Orientation Level: Oriented to person,Oriented to place,Disoriented to time,Disoriented to situation Cognition Arousal/Alertness: Awake/alert Behavior During Therapy: WFL for tasks assessed/performed Overall Cognitive Status: No family/caregiver present to determine baseline cognitive functioning General Comments: Pt oriented to self only. With MIN verbal cues, pt able to attend to familiar ADL tasks, use materials for ADLs appropriately, and follow simple 1-step directions. Pt continues to demonstrate poor awareness of deficits. Pt required significant time to process clock-drawing task and requiring MIN verbal cues to identify mistakes (i.e., adding a 13th hour and forgetting hour hand). Pt able to identify 25% of fall hazards presented to her on fall hazard worksheet. Pt with difficulty remember names to common household items (i.e., toothbrush, rug, table, cat, wine glass).  Blood pressure 117/85, pulse 71, temperature 98.4 F (36.9 C), resp. rate 16, height 5\' 5"  (1.651 m), weight 59 kg, SpO2 99  %. Physical Exam  Gen: no distress, normal appearing HEENT: oral mucosa pink and moist, NCAT Cardio: Reg rate Chest: normal effort, normal rate of breathing Abd: soft, non-distended Ext: no edema Psych: pleasant, normal affect Skin: intact Neuro: makes good eye contact. AOx2 Musculoskeletal: Siting at edge of bed with family at bedside.  Results for orders placed or performed during the hospital encounter of 04/26/21 (from the past 24 hour(s))  Basic metabolic panel     Status: Abnormal   Collection Time: 05/01/21  4:57 PM  Result Value Ref Range   Sodium 139 135 - 145 mmol/L   Potassium 4.0 3.5 - 5.1 mmol/L   Chloride 109 98 - 111 mmol/L   CO2 24 22 - 32 mmol/L   Glucose, Bld 116 (H) 70 - 99 mg/dL   BUN 26 (H) 8 - 23 mg/dL   Creatinine, Ser 0.68 0.44 - 1.00 mg/dL   Calcium 9.1 8.9 - 10.3 mg/dL   GFR, Estimated >60 >60 mL/min   Anion gap 6 5 - 15   No results found.  Assessment/Plan: 1) Brain mass -patient may d/c home with family as that is her and daughter's preference at this time -appropriate oncology follow-up has been scheduled -discussed with daughter that patient would benefit from home therapy and transition to outpatient therapy  All questions answered. Thank you for this consult.   Izora Ribas, MD 05/02/2021

## 2021-05-02 NOTE — Discharge Summary (Signed)
Physician Discharge Summary  Patient ID: Audrey Barron MRN: [id number] DOB/AGE: [birth year] 70 y.o.  Admit date: 04/26/2021 Discharge date: 05/02/2021  Admission Diagnoses:  Discharge Diagnoses:  Active Problems:   Brain mass   Hyponatremia   Delirium due to another medical condition   Discharged Condition: fair  Hospital Course:  70 y.o female with PMH of ovarian cancer presenting to the ED on 5/4 with recurrent falls, confusion, slurred speech and left facial droop. Imaging demonstrated large left heterogeneously enhancing lesion, concerning for neoplasm. Patient was evaluated by Neurosurgery who recommended CT CAP to confirm no other primary lesion then proceed with open biopsy on 5/6. She was also started on Keppra for seizure prophylaxis. Patient was taken to the OR 5/6 for left craniotomy tumor excision and returned to the ICU for close monitoring. 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 After giving mannitol, 3% sodium, and tolvaptan, she has had a significant rise in her serum sodium, and subsequent improvement in neurological condition.   #1.  Left brain mass, s/p craniotomy, resection and biopsy. Delirium. 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 Patient currently on IV steroids.  Will change to oral dexamethasone. She is also on Keppra for seizure prevention. She also being followed by neurosurgery. Patient has been evaluated by oncology, she will follow-up with them on Friday to determine on chemotherapy. Currently, patient is medically stable to be discharged.  #2.  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 Sodium level has normalized.    Consults: Oncology, neurosurgery.  ICU  Significant Diagnostic Studies:  MRI HEAD WITHOUT AND WITH CONTRAST  TECHNIQUE: Multiplanar, multiecho pulse sequences of the brain and surrounding structures were obtained without and with intravenous contrast.  CONTRAST:  54mL GADAVIST GADOBUTROL 1  MMOL/ML IV SOLN  COMPARISON:  Head CT same day  FINDINGS: Brain: There is multifocal abnormal contrast enhancement centered within the left basal ganglia and extending into the left temporal lobe and crossing the midline at the level of the fornix. There is a large amount of surrounding hyperintense T2-weighted signal. At the level of the foramina of [place], there is rightward midline shift measuring 12 mm. The contrast enhancement pattern is predominantly solid.  Vascular: Major flow voids are preserved.  Skull and upper cervical spine: Normal calvarium and skull base. Visualized upper cervical spine and soft tissues are normal.  Sinuses/Orbits:No paranasal sinus fluid levels or advanced mucosal thickening. No mastoid or middle ear effusion. Normal orbits.  IMPRESSION: 1. Multifocal abnormal contrast enhancement centered within the left basal ganglia and extending into the left temporal lobe, crossing the midline at the level of the fornix. 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 The lack of necrosis is not characteristic of glioblastoma, which remains the second consideration. 2. Rightward midline shift measuring 12 mm.   Electronically Signed   By: Ulyses Jarred M.D.   On: 04/27/2021 00:37 CT HEAD WITHOUT CONTRAST  TECHNIQUE: Contiguous axial images were obtained from the base of the skull through the vertex without intravenous contrast.  COMPARISON:  04/26/2021  FINDINGS: Brain: Small peripheral biopsy sites is present along the superolateral left temporal lobe containing air and blood products. Minimal extra-axial air is present along the cerebral convexity. Stable appearance of relatively hyperattenuating lesion centered within the left basal ganglia with contralateral extension and surrounding hypoattenuation corresponding to abnormal signal on MRI. There is stable rightward midline shift and trapping of the right lateral ventricle as well as partial  effacement of basal cisterns. No new loss of gray-white differentiation.  Vascular: No new findings.  Skull: New small left craniotomy.  Sinuses/Orbits: No acute abnormality  Other: None.  IMPRESSION: Expected post biopsy changes. Otherwise stable appearance of mass and associated mass effect.   Electronically Signed   By: Macy Mis M.D.   On: 04/28/2021 15:52  CT CHEST, ABDOMEN, AND PELVIS WITH CONTRAST  TECHNIQUE: Multidetector CT imaging of the chest, abdomen and pelvis was performed following the standard protocol during bolus administration of intravenous contrast.  CONTRAST:  40mL OMNIPAQUE IOHEXOL 300 MG/ML  SOLN  COMPARISON:  Only brain imaging is available for comparison.  FINDINGS: CT CHEST FINDINGS  Cardiovascular: Scattered aortic atherosclerosis. No aneurysmal dilation of the thoracic aorta. Heart size normal without pericardial effusion. Central pulmonary vasculature is normal caliber.  Mediastinum/Nodes: Esophagus mildly patulous. No thoracic inlet lymphadenopathy.  No axillary lymphadenopathy.  No hilar or mediastinal lymphadenopathy.  Lungs/Pleura: No consolidation or pleural effusion. Atelectasis at the lung bases. Airways are patent. No suspicious mass or nodule.  Musculoskeletal: See below for full musculoskeletal details.  CT ABDOMEN PELVIS FINDINGS  Hepatobiliary: Low-density lesions in the liver, for instance on image 52 of series 2, well-circumscribed area in the RIGHT hepatic lobe measuring approximately 11 mm and another subcentimeter area in the anterior LEFT hepatic lobe, an additional 1.7 cm lesion in the posterior RIGHT hepatic lobe clearly with water density and well-circumscribed margins. These are compatible with cysts. No pericholecystic stranding. The portal vein is patent. No biliary duct dilation.  Pancreas: Normal, without mass, inflammation or ductal dilatation.  Spleen: Normal  spleen.  Adrenals/Urinary Tract: Adrenal glands are normal.  Symmetric renal enhancement. No suspicious renal lesion. No hydronephrosis. The urinary bladder is under distended limiting assessment.  Stomach/Bowel:  Mild gastric thickening versus under distension.  Small bowel is normal caliber without signs of adjacent inflammation or visible lesion, limited assessment due to lack of contrast.  No signs of colonic obstruction or acute colonic inflammation. No pericecal stranding. Appendix is normal.  Vascular/Lymphatic: Tortuous nonaneurysmal abdominal aorta with mild atherosclerotic plaque. Mildly dilated LEFT common iliac artery up to 1.5 cm also tortuous.  Smooth contour of the IVC.  There is no gastrohepatic or hepatoduodenal ligament lymphadenopathy. No retroperitoneal or mesenteric lymphadenopathy. Engorgement of LEFT ovarian vein.  No pelvic sidewall lymphadenopathy.  Reproductive: Engorgement of parametrial veins and LEFT ovarian vein as described. LEFT renal vein is patent.  Other: No ascites.  Musculoskeletal: No acute bone finding. No destructive bone process. Spinal degenerative changes.  IMPRESSION: 1. No adenopathy in the chest, abdomen or pelvis. No signs of neoplasm. 2. Mild gastric thickening versus under distension. Correlate with any symptoms of gastritis. 3. Engorgement of parametrial veins and LEFT ovarian vein, nonspecific but can be seen in the setting of pelvic congestion syndrome. 4. Aortic atherosclerosis.  Aortic Atherosclerosis (ICD10-I70.0) and Emphysema (ICD10-J43.9).   Electronically Signed   By: Zetta Bills M.D.   On: 04/27/2021 10:18  Treatments: Steroids  Discharge Exam: Blood pressure 117/85, pulse 71, temperature 98.4 F (36.9 C), resp. rate 16, height 5\' 5"  (1.651 m), weight 59 kg, SpO2 99 %. General appearance: alert, cooperative and Disorientated to time Resp: clear to auscultation  bilaterally Cardio: regular rate and rhythm, S1, S2 normal, no murmur, click, rub or gallop GI: soft, non-tender; bowel sounds normal; no masses,  no organomegaly Extremities: extremities normal, atraumatic, no cyanosis or edema  Disposition: Discharge disposition: 01-Home or Self Care       Discharge Instructions    Ambulatory referral to Hematology / Oncology  Complete by: As directed    Diet - low sodium heart healthy   Complete by: As directed    Discharge wound care:   Complete by: As directed    Follow with RN   Increase activity slowly   Complete by: As directed      Allergies as of 05/02/2021   Not on File     Medication List    STOP taking these medications   aspirin 81 MG chewable tablet     TAKE these medications   cyanocobalamin 1000 MCG tablet Take 1,000 mcg by mouth daily.   dexamethasone 4 MG tablet Commonly known as: DECADRON Take 1 tablet (4 mg total) by mouth 3 (three) times daily.   diphenhydrAMINE 50 MG capsule Commonly known as: BENADRYL Take 1 capsule (50 mg total) by mouth at bedtime as needed for sleep.   levETIRAcetam 500 MG tablet Commonly known as: Keppra Take 1 tablet (500 mg total) by mouth 2 (two) times daily.   pantoprazole 40 MG tablet Commonly known as: PROTONIX Take 1 tablet (40 mg total) by mouth at bedtime.            Durable Medical Equipment  (From admission, onward)         Start     Ordered   05/02/21 1008  For home use only DME Walker rolling  Once       Question Answer Comment  Walker: With 5 Inch Wheels   Patient needs a walker to treat with the following condition Brain mass      05/02/21 1007           Discharge Care Instructions  (From admission, onward)         Start     Ordered   05/02/21 0000  Discharge wound care:       Comments: Follow with RN   05/02/21 1013          Follow-up [place], [facility] Personal Follow up in 1 week(s).   Contact  information: [address] [place] [address] [phone]               Signed: Sharen Hones 05/02/2021, 10:13 AM

## 2021-05-03 NOTE — TOC Transition Note (Signed)
Transition of Care [facility]) - CM/SW Discharge Note   Patient Details  Name: Momo Braun MRN: [id number] Date of Birth: [birth year]  Transition of Care [facility]) CM/SW Contact:  Shelbie Hutching, RN Phone Number: 05/03/2021, 9:04 AM   Clinical Narrative:    [facility] was unable to accept patient due to staffing.  [place] has accepted referral for home health services.  PCP updated in system as Delight Stare at [facility].    Final next level of care: Home w Home Health Services Barriers to Discharge: Barriers Resolved   Patient Goals and CMS Choice Patient states their goals for this hospitalization and ongoing recovery are:: Daughter wants to take patient home with home health services, and would like a hospital bed CMS Medicare.gov Compare Post Acute Care list provided to:: Patient Represenative (must comment) Choice offered to / list presented to : Adult Children  Discharge Placement                       Discharge Plan and Services   Discharge Planning Services: CM Consult Post Acute Care Choice: NA          DME Arranged: Walker rolling,Hospital bed DME Agency: [employer],NA Date DME Agency Contacted: 05/02/21 Time DME Agency Contacted: [id number] Representative spoke with at DME Agency: [place]: RN,PT,OT,Nurse's [place]: [place] ([place]) Date [place]: 05/02/21 Time [place]: 1200 Representative spoke with at [place]: [place] ([place]) Interventions     Readmission Risk Interventions No flowsheet data found.

## 2021-05-04 ENCOUNTER — Ambulatory Visit: Admission: RE | Admit: 2021-05-04 | Payer: Medicare HMO | Source: Ambulatory Visit

## 2021-05-04 LAB — SURGICAL PATHOLOGY

## 2021-05-05 ENCOUNTER — Telehealth: Payer: Self-pay | Admitting: *Deleted

## 2021-05-05 ENCOUNTER — Inpatient Hospital Stay: Payer: Medicare HMO | Attending: Internal Medicine | Admitting: Internal Medicine

## 2021-05-05 ENCOUNTER — Encounter: Payer: Self-pay | Admitting: Internal Medicine

## 2021-05-05 ENCOUNTER — Inpatient Hospital Stay: Payer: Medicare HMO

## 2021-05-05 ENCOUNTER — Other Ambulatory Visit: Payer: Self-pay

## 2021-05-05 VITALS — BP 115/82 | HR 87 | Temp 97.0°F | Resp 16

## 2021-05-05 DIAGNOSIS — F0390 Unspecified dementia without behavioral disturbance: Secondary | ICD-10-CM | POA: Insufficient documentation

## 2021-05-05 DIAGNOSIS — C8519 Unspecified B-cell lymphoma, extranodal and solid organ sites: Secondary | ICD-10-CM

## 2021-05-05 DIAGNOSIS — C8589 Other specified types of non-Hodgkin lymphoma, extranodal and solid organ sites: Secondary | ICD-10-CM

## 2021-05-05 DIAGNOSIS — B182 Chronic viral hepatitis C: Secondary | ICD-10-CM | POA: Insufficient documentation

## 2021-05-05 LAB — LACTATE DEHYDROGENASE: LDH: 115 U/L (ref 98–192)

## 2021-05-05 LAB — HEPATITIS PANEL, ACUTE
HCV Ab: REACTIVE — AB
Hep A IgM: NONREACTIVE
Hep B C IgM: NONREACTIVE
Hepatitis B Surface Ag: NONREACTIVE

## 2021-05-05 LAB — HIV ANTIBODY (ROUTINE TESTING W REFLEX): HIV Screen 4th Generation wRfx: NONREACTIVE

## 2021-05-05 MED ORDER — POLYETHYLENE GLYCOL 3350 17 G PO PACK: 17.0000 g | PACK | Freq: Every day | ORAL | 0 refills | Status: DC

## 2021-05-05 NOTE — Telephone Encounter (Signed)
Patients daughter Anderson [place] notified of appt for port placment on 5/20 at 8:30, patient to arrive at 7:30. Patients daughter verbalized understanding of appt details.

## 2021-05-05 NOTE — Progress Notes (Signed)
[facility] [place], [place] [address] [phone]   New Patient Evaluation  Date of Service: 05/05/21 Patient Name: Audrey Barron Patient MRN: [id number] Patient DOB: November 30, 1951 Provider: Ventura Sellers, MD  Identifying Statement:  Audrey Barron is a 70 y.o. female with left hemispheric Primary CNS Lymphoma who presents for initial consultation and evaluation.    Referring Provider: Health, [facility] Dept Personal [place],   [address]  Oncologic History: Oncology History  Primary CNS lymphoma [facility])  04/28/2021 Surgery   Open biopsy with Dr. Izora Ribas; path demonstrates CNS B-cell lymphoma, CD20+     Biomarkers:  CD20 positive.  Bcl-6 positive  Ki-67 95%      History of Present Illness: The patient's records from the referring physician were obtained and reviewed and the patient interviewed to confirm this HPI.  Audrey Barron presented to medical attention in April, with 2-3 months history of confusion, impaired speech, balance and gait impairment.  Neurologist ordered an MRI, which demonstrated infiltrative tumor within much of left hemisphere.  She underwent craniotomy and open biopsy on 04/28/21 with Dr. Izora Ribas at [facility], which yielded a B-cell CNS Lymphoma.  Following surgery, she has continued to experience difficulty speaking, weakness on the right side, and poor balance.  She is unable to walk without assistance at home.  At present she is reliant on her daughter for help with dressing, cooking, tolieting; a majority of activities of daily living.  She has been dosing decadron 1m three times per day; this has not clearly led to any improvements in functional status.   Medications: Current Outpatient Medications on File Prior to Visit  Medication Sig Dispense Refill  . cyanocobalamin 1000 MCG tablet Take 1,000 mcg by mouth daily.    .Marland Kitchendexamethasone (DECADRON) 4 MG  tablet Take 1 tablet (4 mg total) by mouth 3 (three) times daily. 30 tablet 0  . diphenhydrAMINE (BENADRYL) 50 MG capsule Take 1 capsule (50 mg total) by mouth at bedtime as needed for sleep. 30 capsule 0  . levETIRAcetam (KEPPRA) 500 MG tablet Take 1 tablet (500 mg total) by mouth 2 (two) times daily. 60 tablet 0  . pantoprazole (PROTONIX) 40 MG tablet Take 1 tablet (40 mg total) by mouth at bedtime. 30 tablet 0   No current facility-administered medications on file prior to visit.    Allergies: Not on File Past Medical History: History reviewed. No pertinent past medical history. Past Surgical History:  Past Surgical History:  Procedure Laterality Date  . APPLICATION OF CRANIAL NAVIGATION N/A 04/28/2021   Procedure: APPLICATION OF CRANIAL NAVIGATION;  Surgeon: YMeade Maw MD;  Location: [facility] ORS;  Service: Neurosurgery;  Laterality: N/A;  . CRANIOTOMY Left 04/28/2021   Procedure: CRANIOTOMY TUMOR EXCISION;  Surgeon: YMeade Maw MD;  Location: [facility] ORS;  Service: Neurosurgery;  Laterality: Left;   Social History:  Social History   Socioeconomic History  . Marital status: Single    Spouse name: Not on file  . Number of children: Not on file  . Years of education: Not on file  . Highest education level: Not on file  Occupational History  . Not on file  Tobacco Use  . Smoking status: Never Smoker  . Smokeless tobacco: Never Used  Substance and Sexual Activity  . Alcohol use: Not Currently  . Drug use: Not Currently  . Sexual activity: Not on file  Other Topics Concern  . Not on file  Social History Narrative  . Not on file   Social Determinants of Health   Financial Resource Strain: Not on file  Food Insecurity: Not on file  Transportation Needs: Not on file  Physical Activity: Not on file  Stress: Not on file  Social Connections: Not on file  Intimate Partner Violence: Not on file   Family History: History reviewed. No pertinent family history.  Review  of Systems: Constitutional: Doesn't report fevers, chills or abnormal weight loss Eyes: Doesn't report blurriness of vision Ears, nose, mouth, throat, and face: Doesn't report sore throat Respiratory: Doesn't report cough, dyspnea or wheezes Cardiovascular: Doesn't report palpitation, chest discomfort  Gastrointestinal: +constipation GU: Doesn't report incontinence Skin: Doesn't report skin rashes Neurological: Per HPI Musculoskeletal: Doesn't report joint pain Behavioral/Psych: Doesn't report anxiety  Physical Exam: Vitals:   05/05/21 1110  BP: 115/82  Pulse: 87  Resp: 16  Temp: (!) 97 F (36.1 C)  SpO2: 98%   KPS: 60. General: Alert, cooperative, pleasant, in no acute distress Head: Normal EENT: No conjunctival injection or scleral icterus.  Lungs: Resp effort normal Cardiac: Regular rate Abdomen: Non-distended abdomen Skin: No rashes cyanosis or petechiae. Extremities: No clubbing or edema  Neurologic Exam: Mental Status: Awake, alert, attentive to examiner. Oriented to self and environment. Language is impaired with regards to both fluency and comprehension.  Further mental status evaluation limited by dysphasia. Cranial Nerves: Visual acuity is grossly normal. Visual fields are full. Extra-ocular movements intact. No ptosis. Face is symmetric Motor: Tone and bulk are normal. Power is 4/5 in right arm and leg. Reflexes are symmetric, no pathologic reflexes present.  Sensory: Intact to light touch Gait: Ataxic, not independent   Labs: I have reviewed the data as listed    Component Value Date/Time   NA 139 05/01/2021 1657   K 4.0 05/01/2021 1657   CL 109 05/01/2021 1657   CO2 24 05/01/2021 1657   GLUCOSE 116 (H) 05/01/2021 1657   BUN 26 (H) 05/01/2021 1657   CREATININE 0.68 05/01/2021 1657   CALCIUM 9.1 05/01/2021 1657   PROT 6.9 04/28/2021 2245   ALBUMIN 3.8 04/28/2021 2245   AST 19 04/28/2021 2245   ALT 18 04/28/2021 2245   ALKPHOS 25 (L) 04/28/2021 2245    BILITOT 1.2 04/28/2021 2245   GFRNONAA >60 05/01/2021 1657   Lab Results  Component Value Date   WBC 10.2 05/01/2021   HGB 13.4 05/01/2021   HCT 40.0 05/01/2021   MCV 87.1 05/01/2021   PLT 197 05/01/2021    Imaging:  DG Abd 1 View  Result Date: 04/28/2021 CLINICAL DATA:  NG tube placement EXAM: ABDOMEN - 1 VIEW COMPARISON:  CT 04/27/2021 FINDINGS: Esophageal tube tip in the left upper quadrant over the mid stomach. Mild diffuse increased bowel gas without obstructive pattern. IMPRESSION: Esophageal tube tip overlies the proximal to mid stomach. Electronically Signed   By: Donavan Foil M.D.   On: 04/28/2021 21:20   CT HEAD WO CONTRAST  Result Date: 04/28/2021 CLINICAL DATA:  Post biopsy EXAM: CT HEAD WITHOUT CONTRAST TECHNIQUE: Contiguous axial images were obtained from the base of the skull through the vertex without intravenous contrast. COMPARISON:  04/26/2021 FINDINGS: Brain: Small peripheral biopsy sites is present along the superolateral left temporal lobe containing air and blood products. Minimal extra-axial air is present along the cerebral convexity. Stable appearance of relatively hyperattenuating lesion centered within the left basal ganglia with contralateral extension and surrounding hypoattenuation corresponding to abnormal signal on MRI. There is stable  rightward midline shift and trapping of the right lateral ventricle as well as partial effacement of basal cisterns. No new loss of gray-white differentiation. Vascular: No new findings. Skull: New small left craniotomy. Sinuses/Orbits: No acute abnormality Other: None. IMPRESSION: Expected post biopsy changes. Otherwise stable appearance of mass and associated mass effect. Electronically Signed   By: Macy Mis M.D.   On: 04/28/2021 15:52   CT Head Wo Contrast  Result Date: 04/26/2021 CLINICAL DATA:  Disoriented, multiple falls, facial droop and weakness from previous stroke EXAM: CT HEAD WITHOUT CONTRAST TECHNIQUE:  Contiguous axial images were obtained from the base of the skull through the vertex without intravenous contrast. COMPARISON:  None. FINDINGS: Brain: There is a large mass centered in the region of the left basal ganglia, measuring approximately 4.3 x 3.5 cm. Marked surrounding vasogenic edema throughout the left cerebral hemisphere, with marked mass effect and rightward midline shift measuring approximately 14 mm at the level of the septum pellucidum. There is effacement the basilar cisterns. MRI is recommended for further evaluation. No acute hemorrhage. Marked effacement of the lateral ventricles by the mass effect described above. No acute extra-axial fluid collections. Vascular: No hyperdense vessel or unexpected calcification. Skull: Normal. Negative for fracture or focal lesion. Sinuses/Orbits: No acute finding. Other: None. IMPRESSION: 1. Large heterogeneous mass centered in the region of the left basal ganglia, with significant surrounding vasogenic edema and mass effect. MRI is recommended for further evaluation. 2. No acute infarct or hemorrhage. Critical Value/emergent results were called by telephone at the time of interpretation on 04/26/2021 at 11:15 pm to provider [facility] , who verbally acknowledged these results. Electronically Signed   By: Randa Ngo M.D.   On: 04/26/2021 23:15   MR Brain W and Wo Contrast  Result Date: 04/27/2021 CLINICAL DATA:  Brain mass EXAM: MRI HEAD WITHOUT AND WITH CONTRAST TECHNIQUE: Multiplanar, multiecho pulse sequences of the brain and surrounding structures were obtained without and with intravenous contrast. CONTRAST:  80m GADAVIST GADOBUTROL 1 MMOL/ML IV SOLN COMPARISON:  Head CT same day FINDINGS: Brain: There is multifocal abnormal contrast enhancement centered within the left basal ganglia and extending into the left temporal lobe and crossing the midline at the level of the fornix. There is a large amount of surrounding hyperintense T2-weighted  signal. At the level of the foramina of [place], there is rightward midline shift measuring 12 mm. The contrast enhancement pattern is predominantly solid. Vascular: Major flow voids are preserved. Skull and upper cervical spine: Normal calvarium and skull base. Visualized upper cervical spine and soft tissues are normal. Sinuses/Orbits:No paranasal sinus fluid levels or advanced mucosal thickening. No mastoid or middle ear effusion. Normal orbits. IMPRESSION: 1. Multifocal abnormal contrast enhancement centered within the left basal ganglia and extending into the left temporal lobe, crossing the midline at the level of the fornix. 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 The lack of necrosis is not characteristic of glioblastoma, which remains the second consideration. 2. Rightward midline shift measuring 12 mm. Electronically Signed   By: KUlyses JarredM.D.   On: 04/27/2021 00:37   CT CHEST ABDOMEN PELVIS W CONTRAST  Result Date: 04/27/2021 CLINICAL DATA:  Metastatic disease evaluation, history of possible lymphoma in a 70year old female found to have abnormality on brain imaging. EXAM: CT CHEST, ABDOMEN, AND PELVIS WITH CONTRAST TECHNIQUE: Multidetector CT imaging of the chest, abdomen and pelvis was performed following the standard protocol during bolus administration of intravenous contrast. CONTRAST:  755mOMNIPAQUE IOHEXOL 300 MG/ML  SOLN  COMPARISON:  Only brain imaging is available for comparison. FINDINGS: CT CHEST FINDINGS Cardiovascular: Scattered aortic atherosclerosis. No aneurysmal dilation of the thoracic aorta. Heart size normal without pericardial effusion. Central pulmonary vasculature is normal caliber. Mediastinum/Nodes: Esophagus mildly patulous. No thoracic inlet lymphadenopathy. No axillary lymphadenopathy. No hilar or mediastinal lymphadenopathy. Lungs/Pleura: No consolidation or pleural effusion. Atelectasis at the lung bases. Airways are patent. No suspicious mass or nodule.  Musculoskeletal: See below for full musculoskeletal details. CT ABDOMEN PELVIS FINDINGS Hepatobiliary: Low-density lesions in the liver, for instance on image 52 of series 2, well-circumscribed area in the RIGHT hepatic lobe measuring approximately 11 mm and another subcentimeter area in the anterior LEFT hepatic lobe, an additional 1.7 cm lesion in the posterior RIGHT hepatic lobe clearly with water density and well-circumscribed margins. These are compatible with cysts. No pericholecystic stranding. The portal vein is patent. No biliary duct dilation. Pancreas: Normal, without mass, inflammation or ductal dilatation. Spleen: Normal spleen. Adrenals/Urinary Tract: Adrenal glands are normal. Symmetric renal enhancement. No suspicious renal lesion. No hydronephrosis. The urinary bladder is under distended limiting assessment. Stomach/Bowel: Mild gastric thickening versus under distension. Small bowel is normal caliber without signs of adjacent inflammation or visible lesion, limited assessment due to lack of contrast. No signs of colonic obstruction or acute colonic inflammation. No pericecal stranding. Appendix is normal. Vascular/Lymphatic: Tortuous nonaneurysmal abdominal aorta with mild atherosclerotic plaque. Mildly dilated LEFT common iliac artery up to 1.5 cm also tortuous. Smooth contour of the IVC. There is no gastrohepatic or hepatoduodenal ligament lymphadenopathy. No retroperitoneal or mesenteric lymphadenopathy. Engorgement of LEFT ovarian vein. No pelvic sidewall lymphadenopathy. Reproductive: Engorgement of parametrial veins and LEFT ovarian vein as described. LEFT renal vein is patent. Other: No ascites. Musculoskeletal: No acute bone finding. No destructive bone process. Spinal degenerative changes. IMPRESSION: 1. No adenopathy in the chest, abdomen or pelvis. No signs of neoplasm. 2. Mild gastric thickening versus under distension. Correlate with any symptoms of gastritis. 3. Engorgement of  parametrial veins and LEFT ovarian vein, nonspecific but can be seen in the setting of pelvic congestion syndrome. 4. Aortic atherosclerosis. Aortic Atherosclerosis (ICD10-I70.0) and Emphysema (ICD10-J43.9). Electronically Signed   By: Zetta Bills M.D.   On: 04/27/2021 10:18    Pathology: SURGICAL PATHOLOGY  CASE: [id number]  PATIENT: Junita Push  Surgical Pathology Report   Specimen Submitted:  A. Temporal lesion, left   Clinical History: Left brain mass   DIAGNOSIS:  A. BRAIN, LEFT TEMPORAL LOBE; CRANIOTOMY WITH BIOPSY:  - HIGH GRADE B-CELL LYMPHOMA, COMPATIBLE WITH PRIMARY CNS LYMPHOMA.   Comment:  Biopsy sections display neural tissue predominantly replaced by an  abnormal proliferation of intermediate to large lymphocytes. These  lymphocytes display vesicular chromatin, prominent nucleoli, high N:C  ratio, and abnormal nuclear contours. Apoptotic debris is abundant. In  addition, there are patchy areas of tumor necrosis.   Immunohistochemical studies show this abnormal proliferation to be  comprised of a large CD20+ B cells, with relatively few CD3+ T cells.  Abnormal B cells are positive for BCL-6 and MUM-1, and negative for  BCL-2. CD10 shows diffuse dim blush like staining, the significance of  which is unclear. Cyclin-D1 and CD30 are negative. c-myc is positive,  marking 50-60% of abnormal B cells. Ki-67 is significantly elevated,  with positivity in greater than 95% of abnormal B-cells. An  Epstein-Barr virus encoded RNA (EBER) ISH probe is negative.   The histologic and immunohistochemical features are compatible with a  high-grade large B-cell lymphoma, with features consistent with  a  primary CNS lymphoma. Although CD10 positivity within this entity would  be rare, the absence of radiographic evidence of peripheral adenopathy  or other potential sites of involvement would favor a primary CNS  lymphoma. There is sufficient tissue present for  ancillary molecular  testing if desired.    Assessment/Plan Primary CNS lymphoma ([place]) [C85.89]  We appreciate the opportunity to participate in the care of [facility].  She presents with clinical, radiographic, histologic syndrome consistent with Primary B-cell CNS Lymphoma.    We had an extensive conversation with Junita Push and her daughter regarding pathology, prognosis, and available treatment pathways.  They understand her age, high degree of dominant hemisphere tumor burden affecting deep structures, and poor functional status all negatively impact her prognosis.  For patient's MSKCC RPA Class 3, average survival was roughly 1 year.    We discussed next steps in the workup, which include full systemic staging: -Whole body PET -Slit lamp opthalmoscopy -Spine MRI (total) -Serum for routine studies, LDH, hepatitis panel, HIV -Bone marrow biopsy (may defer) -CSF cytology, flow cytometry (may defer)  Central IV port catheter will be placed via interventional radiology for safe administration of chemotherapy.  While staging is in process, we will plan for induction therapy with primarily inpatient high dose methotrexate with rituximab, and temozolomide, as discussed with the patient.  The goal of therapy will be complete response.  We discussed the method of delivery which will be inpatient administration for 3.5g/m2 MTX every 2-3 weeks for 6 cycles, with leucovorin rescue q6 hours after 24 hours.  Rituximab will be administered on day 3 of each cycle. Temozolomide will be administered at 128m/m2 for 5 days in one 28 day cycle following cycle 3 of MTX, with plan for 6 further cycles of 5-day Temozolomide as consolidation if complete response is obtained.  We will target initiation of therapy by 4 weeks post craniotomy.  We recommended he reduce decadron to 420mBID if tolerated.  We will follow up with her in 2 weeks for further management of medications, results of staging  workup.  She should call us[place]oreaith any new or progressive neurologic deficits.  Screening for potential clinical trials was performed and discussed using eligibility criteria for active protocols at [facility], as well as national database available on [email or web address]   The patient is not a candidate for a research protocol at this time due to poor functional status.   We spent twenty additional minutes teaching regarding the natural history, biology, and historical experience in the treatment of brain tumors. We then discussed in detail the current recommendations for therapy focusing on the mode of administration, mechanism of action, anticipated toxicities, and quality of life issues associated with this plan. We also provided teaching sheets for the patient to take home as an additional resource.  All questions were answered. The patient knows to call the clinic with any problems, questions or concerns. No barriers to learning were detected.  The total time spent in the encounter was 60 minutes and more than 50% was on counseling and review of test results   ZaVentura SellersMD Medical Director of Neuro-Oncology [facility] [place]5/13/22 11:58 AM

## 2021-05-05 NOTE — Progress Notes (Signed)
Patient here for oncology follow-up appointment, expresses concerns of Constipation, fatigue, unsteadiness

## 2021-05-08 LAB — VITAMIN B1: Vitamin B1 (Thiamine): 109 nmol/L (ref 66.5–200.0)

## 2021-05-09 NOTE — Progress Notes (Signed)
Called patient at [phone] today  and spoke with daughter/guardian Audrey Barron  regarding scheduled procedure for Port-a-catheter placement on Friday 05/12/2021. Discussed: Arrival time of 7:15 am to registration and she verbalized arrival time to registration; Medication details-daughter states patient cannot take meds with just a sip of water so she is holding all meds morning of procedure and will give them when they return home, daughter states patient is not on any blood thinners (none listed in current med list) and does not have diabetes. Discussed importance of NPO after midnight day of procedure, Need of responsible driver to drive home and daughter states that will be her as patient lives with daughter and she will be home with her all day/night after procedure, No valuables, Comfortable clothing and daughter verbalized again the arrival time of 7:15 to registration, and verbalized understanding of instructions.  Questions answered.

## 2021-05-11 ENCOUNTER — Inpatient Hospital Stay: Payer: Medicare HMO

## 2021-05-11 ENCOUNTER — Other Ambulatory Visit: Payer: Self-pay | Admitting: Radiology

## 2021-05-11 ENCOUNTER — Inpatient Hospital Stay: Payer: Medicare HMO | Admitting: Oncology

## 2021-05-12 ENCOUNTER — Other Ambulatory Visit: Payer: Self-pay

## 2021-05-12 ENCOUNTER — Ambulatory Visit
Admission: RE | Admit: 2021-05-12 | Discharge: 2021-05-12 | Disposition: A | Discharge status: Home / Self Care | Payer: Medicare HMO | Source: Ambulatory Visit | Attending: Internal Medicine | Admitting: Internal Medicine

## 2021-05-12 DIAGNOSIS — Z8679 Personal history of other diseases of the circulatory system: Secondary | ICD-10-CM | POA: Diagnosis not present

## 2021-05-12 DIAGNOSIS — C8589 Other specified types of non-Hodgkin lymphoma, extranodal and solid organ sites: Secondary | ICD-10-CM | POA: Insufficient documentation

## 2021-05-12 DIAGNOSIS — Z79899 Other long term (current) drug therapy: Secondary | ICD-10-CM | POA: Insufficient documentation

## 2021-05-12 DIAGNOSIS — F0390 Unspecified dementia without behavioral disturbance: Secondary | ICD-10-CM | POA: Insufficient documentation

## 2021-05-12 DIAGNOSIS — Z8673 Personal history of transient ischemic attack (TIA), and cerebral infarction without residual deficits: Secondary | ICD-10-CM | POA: Insufficient documentation

## 2021-05-12 HISTORY — DX: Malignant (primary) neoplasm, unspecified: C80.1

## 2021-05-12 HISTORY — DX: Unspecified dementia, unspecified severity, without behavioral disturbance, psychotic disturbance, mood disturbance, and anxiety: F03.90

## 2021-05-12 HISTORY — PX: IR IMAGING GUIDED PORT INSERTION: IMG5740

## 2021-05-12 IMAGING — US IR IMAGING GUIDED PORT INSERTION
3 series · 5 of 5 positions shown · non-contrast
Comparison: None.

INDICATION: 6-year-old female with history of intracranial lymphoma requiring
central venous access for chemotherapy.

EXAM:
IMPLANTED PORT A CATH PLACEMENT WITH ULTRASOUND AND FLUOROSCOPIC
GUIDANCE

[Series 1: subclavian 3 fps · 1 of 1 slices shown (1 of 2)]
[im 1/1]
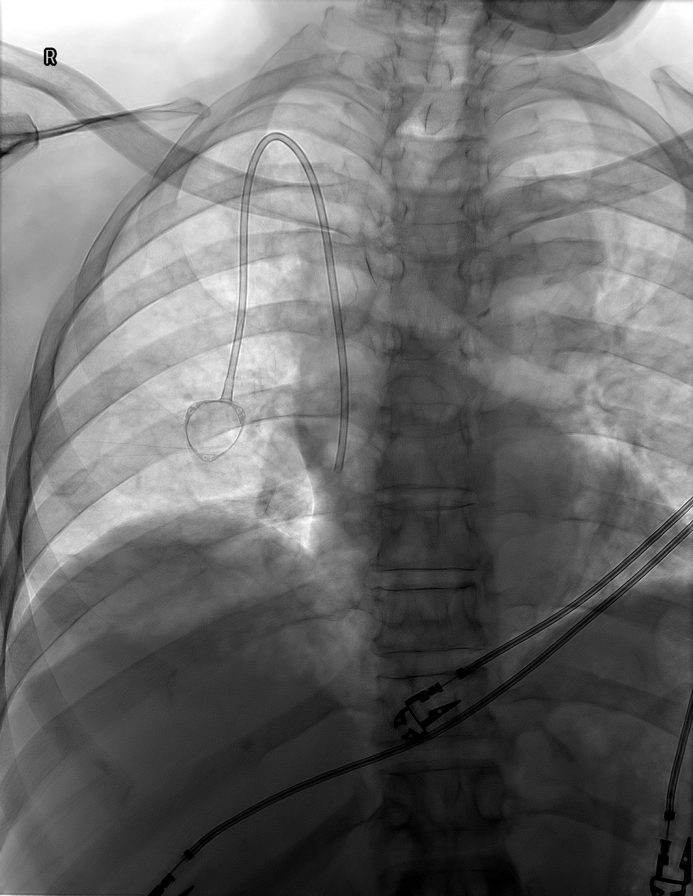

[Series 1: ir fluoro/shunt/fist · 3 of 3 slices shown]
[im 1/3]
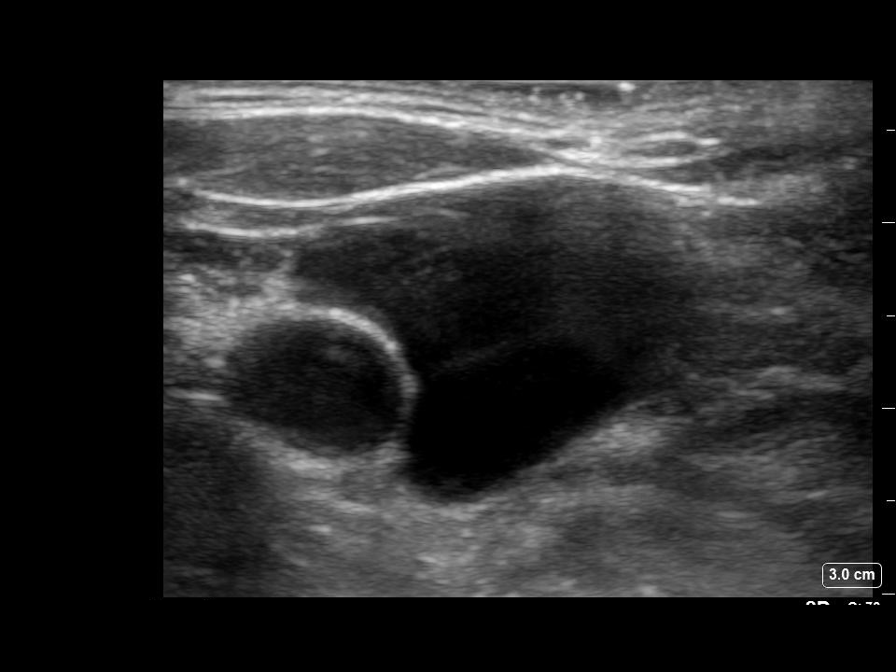
[im 2/3]
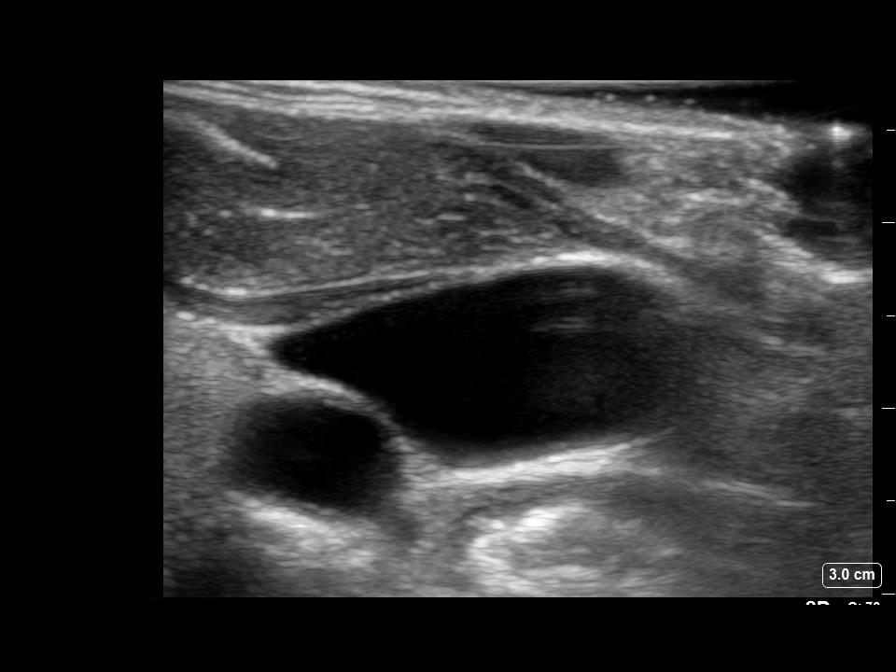
[im 3/3]
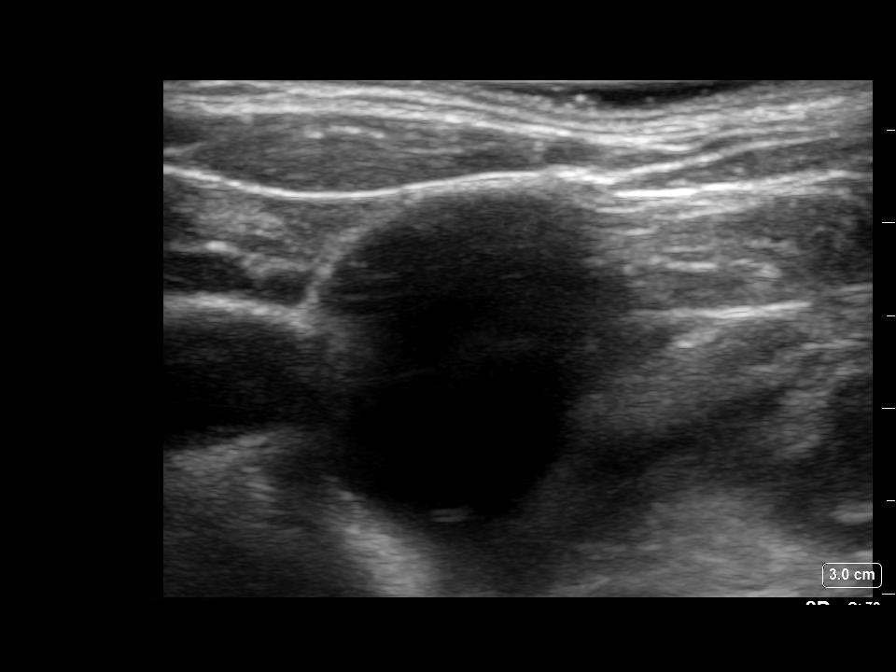

[Series 1: subclavian 3 fps · 1 of 1 slices shown (2 of 2)]
[im 1/1]
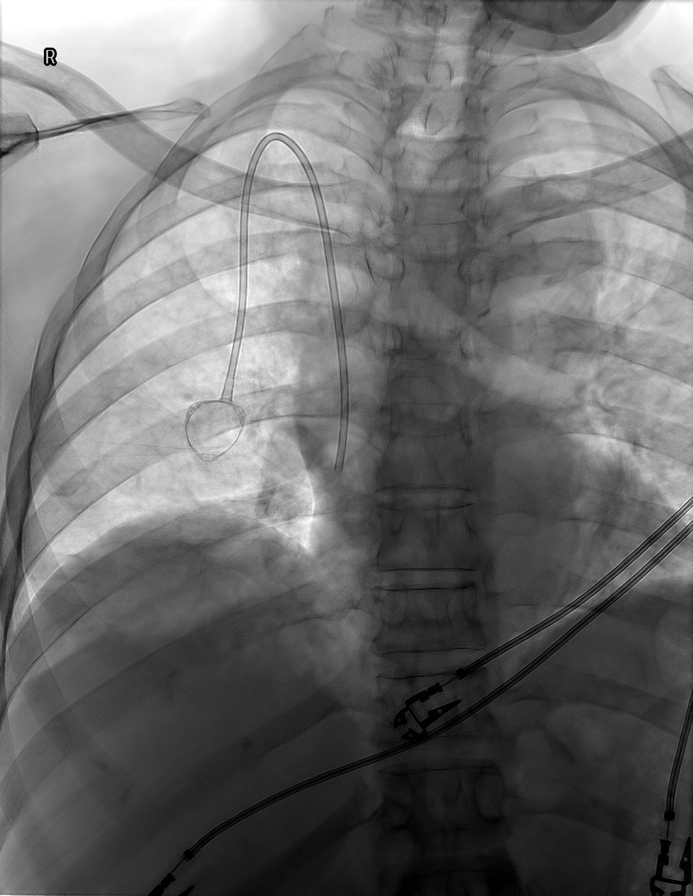

[5 of 5 positions shown; findings below may reference images not displayed]

MEDICATIONS:
None.

ANESTHESIA/SEDATION:
Moderate (conscious) sedation was employed during this procedure. A
total of Versed 2 mg and Fentanyl 100 mcg was administered
intravenously.

Moderate Sedation Time: 18 minutes. The patient's level of
consciousness and vital signs were monitored continuously by
radiology nursing throughout the procedure under my direct
supervision.

CONTRAST:  None

FLUOROSCOPY TIME:  0.1 minutes, (0.92 mGy)

COMPLICATIONS:
None immediate.

PROCEDURE:
The procedure, risks, benefits, and alternatives were explained to
the patient. Questions regarding the procedure were encouraged and
answered. The patient understands and consents to the procedure.

The right neck and chest were prepped with chlorhexidine in a
sterile fashion, and a sterile drape was applied covering the
operative field. Maximum barrier sterile technique with sterile
gowns and gloves were used for the procedure. A timeout was
performed prior to the initiation of the procedure.

Ultrasound was used to examine the jugular vein which was
compressible and free of internal echoes. A skin marker was used to
demarcate the planned venotomy and port pocket incision sites. Local
anesthesia was provided to these sites and the subcutaneous tunnel
track with 1% lidocaine with [DATE] epinephrine.

A small incision was created at the jugular access site and blunt
dissection was performed of the subcutaneous tissues. Under
ultrasound guidance, the jugular vein was accessed with a 21 ga
micropuncture needle and an 0.018" wire was inserted to the superior
vena cava. Real-time ultrasound guidance was utilized for vascular
access including the acquisition of a permanent ultrasound image
documenting patency of the accessed vessel. A 5 Fr micopuncture set
was then used, through which a 0.035" Rosen wire was passed under
fluoroscopic guidance into the inferior vena cava. An 8 Fr dilator
was then placed over the wire.

A subcutaneous port pocket was then created along the upper chest
wall utilizing a combination of sharp and blunt dissection. The
pocket was irrigated with sterile saline, packed with gauze, and
observed for hemorrhage. A single lumen "ISP" sized power injectable
port was chosen for placement. The 8 Fr catheter was tunneled from
the port pocket site to the venotomy incision. The port was placed
in the pocket. The external catheter was trimmed to appropriate
length. The dilator was exchanged for an 8 Fr peel-away sheath under
fluoroscopic guidance. The catheter was then placed through the
sheath and the sheath was removed. Final catheter positioning was
confirmed and documented with a fluoroscopic spot radiograph. The
port was accessed with HUGUE needle, aspirated, and flushed with
heparinized saline.

The deep dermal layer of the port pocket incision was closed with
interrupted 3-0 Vicryl suture. Dermabond was then placed over the
port pocket and neck incisions. The patient tolerated the procedure
well without immediate post procedural complication.
FINDINGS: After catheter placement, the tip lies within the superior
cavoatrial junction. The catheter aspirates and flushes normally and
is ready for immediate use.
IMPRESSION: Successful placement of a power injectable Port-A-Cath via the right
internal jugular vein. The catheter is ready for immediate use.

## 2021-05-12 MED ORDER — FENTANYL CITRATE (PF) 100 MCG/2ML IJ SOLN
INTRAMUSCULAR | Status: AC
  Filled 2021-05-12: qty 2

## 2021-05-12 MED ORDER — MIDAZOLAM HCL 2 MG/2ML IJ SOLN
INTRAMUSCULAR | Status: AC | PRN
  Administered 2021-05-12 (×2): 1 mg via INTRAVENOUS

## 2021-05-12 MED ORDER — SODIUM CHLORIDE 0.9 % IV SOLN: INTRAVENOUS | Status: DC

## 2021-05-12 MED ORDER — MIDAZOLAM HCL 2 MG/2ML IJ SOLN
INTRAMUSCULAR | Status: AC
  Filled 2021-05-12: qty 2

## 2021-05-12 MED ORDER — FENTANYL CITRATE (PF) 100 MCG/2ML IJ SOLN
INTRAMUSCULAR | Status: AC | PRN
  Administered 2021-05-12: 50 ug via INTRAVENOUS
  Administered 2021-05-12 (×2): 25 ug via INTRAVENOUS

## 2021-05-12 MED ORDER — HEPARIN SOD (PORK) LOCK FLUSH 100 UNIT/ML IV SOLN
INTRAVENOUS | Status: AC
  Filled 2021-05-12: qty 5

## 2021-05-12 NOTE — Procedures (Signed)
Interventional Radiology Procedure Note ° °Procedure: Single Lumen Power Port Placement   ° °Access:  Right internal jugular vein ° °Findings: Catheter tip positioned at cavoatrial junction. Port is ready for immediate use.  ° °Complications: None ° °EBL: < 10 mL ° °Recommendations:  °- Ok to shower in 24 hours °- Do not submerge for 7 days °- Routine line care  ° ° °Vonne Mcdanel, MD ° ° ° °

## 2021-05-12 NOTE — Discharge Instructions (Signed)
Implanted Port Insertion, Care After This sheet gives you information about how to care for yourself after your procedure. Your health care provider may also give you more specific instructions. If you have problems or questions, contact your health care provider. What can I expect after the procedure? After the procedure, it is common to have:  Discomfort at the port insertion site.  Bruising on the skin over the port. This should improve over 3-4 days. Follow these instructions at home: Port care  After your port is placed, you will get a manufacturer's information card. The card has information about your port. Keep this card with you at all times.  Take care of the port as told by your health care provider. Ask your health care provider if you or a family member can get training for taking care of the port at home. A home health care nurse may also take care of the port.  Make sure to remember what type of port you have. Incision care  Follow instructions from your health care provider about how to take care of your port insertion site. Make sure you: ? Wash your hands with soap and water before and after you change your bandage (dressing). If soap and water are not available, use hand sanitizer. ? Change your dressing as told by your health care provider. ? Leave stitches (sutures), skin glue, or adhesive strips in place. These skin closures may need to stay in place for 2 weeks or longer. If adhesive strip edges start to loosen and curl up, you may trim the loose edges. Do not remove adhesive strips completely unless your health care provider tells you to do that.  Check your port insertion site every day for signs of infection. Check for: ? Redness, swelling, or pain. ? Fluid or blood. ? Warmth. ? Pus or a bad smell.      Activity  Return to your normal activities as told by your health care provider. Ask your health care provider what activities are safe for you.  Do not  lift anything that is heavier than 10 lb (4.5 kg), or the limit that you are told, until your health care provider says that it is safe. General instructions  Take over-the-counter and prescription medicines only as told by your health care provider.  Do not take baths, swim, or use a hot tub until your health care provider approves. Ask your health care provider if you may take showers. You may only be allowed to take sponge baths.  Do not drive for 24 hours if you were given a sedative during your procedure.  Wear a medical alert bracelet in case of an emergency. This will tell any health care providers that you have a port.  Keep all follow-up visits as told by your health care provider. This is important. Contact a health care provider if:  You cannot flush your port with saline as directed, or you cannot draw blood from the port.  You have a fever or chills.  You have redness, swelling, or pain around your port insertion site.  You have fluid or blood coming from your port insertion site.  Your port insertion site feels warm to the touch.  You have pus or a bad smell coming from the port insertion site. Get help right away if:  You have chest pain or shortness of breath.  You have bleeding from your port that you cannot control. Summary  Take care of the port as told by your   health care provider. Keep the manufacturer's information card with you at all times.  Change your dressing as told by your health care provider.  Contact a health care provider if you have a fever or chills or if you have redness, swelling, or pain around your port insertion site.  Keep all follow-up visits as told by your health care provider. This information is not intended to replace advice given to you by your health care provider. Make sure you discuss any questions you have with your health care provider. Document Revised: 07/08/2018 Document Reviewed: 07/08/2018 Elsevier Patient Education   2021 [employer] Inc[employer]  

## 2021-05-12 NOTE — Progress Notes (Signed)
Patient clinically stable post Port Placement per Dr Lucilla Lame easily to voice, daughter brought to bedside with update given. Received Versed 2 mg along with Fentanyl 100 mcg IV for procedure. Denies complaints at this time.

## 2021-05-12 NOTE — H&P (Signed)
Chief Complaint: Chemotherapy access. Request is for portacath placement  Referring Physician(s): Vaslow,Zachary K  Supervising Physician: Ruthann Cancer  Patient Status: [facility] - Out-pt  History of Present Illness: Audrey Barron is a 70 y.o. female 70 y.o. female outpatient. History of HTN and TIA. Recently admitted for unsteady gait and recurrent falls.  Found duo have a brain mass. Team is requesting a portacath placement for chemotherapy access.    Daughter at bedside. Currently without any significant complaints. Patient alert and laying in bed, calm and comfortable. Denies any fevers, headache, chest pain, SOB, cough, abdominal pain, nausea, vomiting or bleeding. Return precautions and treatment recommendations and follow-up discussed with the patient and her daughter. Both of whom ar agreeable with the plan.   Past Medical History:  Diagnosis Date  . Cancer ([place])    lymphoma  . Dementia [facility])     Past Surgical History:  Procedure Laterality Date  . APPLICATION OF CRANIAL NAVIGATION N/A 04/28/2021   Procedure: APPLICATION OF CRANIAL NAVIGATION;  Surgeon: Meade Maw, MD;  Location: [facility] ORS;  Service: Neurosurgery;  Laterality: N/A;  . CRANIOTOMY Left 04/28/2021   Procedure: CRANIOTOMY TUMOR EXCISION;  Surgeon: Meade Maw, MD;  Location: [facility] ORS;  Service: Neurosurgery;  Laterality: Left;    Allergies: Patient has no allergy information on record.  Medications: Prior to Admission medications   Medication Sig Start Date End Date Taking? Authorizing Provider  cyanocobalamin 1000 MCG tablet Take 1,000 mcg by mouth daily.   Yes [provider]  dexamethasone (DECADRON) 4 MG tablet Take 1 tablet (4 mg total) by mouth 3 (three) times daily. 05/02/21  Yes Sharen Hones, MD  diphenhydrAMINE (BENADRYL) 50 MG capsule Take 1 capsule (50 mg total) by mouth at bedtime as needed for sleep. 05/02/21  Yes Sharen Hones, MD  levETIRAcetam (KEPPRA) 500 MG tablet  Take 1 tablet (500 mg total) by mouth 2 (two) times daily. 05/02/21 06/01/21 Yes Sharen Hones, MD  pantoprazole (PROTONIX) 40 MG tablet Take 1 tablet (40 mg total) by mouth at bedtime. 05/02/21  Yes Sharen Hones, MD  polyethylene glycol (MIRALAX / GLYCOLAX) 17 g packet Take 17 g by mouth daily. 05/05/21  Yes Vaslow, Acey Lav, MD     History reviewed. No pertinent family history.  Social History   Socioeconomic History  . Marital status: Single    Spouse name: Not on file  . Number of children: Not on file  . Years of education: Not on file  . Highest education level: Not on file  Occupational History  . Not on file  Tobacco Use  . Smoking status: Never Smoker  . Smokeless tobacco: Never Used  Substance and Sexual Activity  . Alcohol use: Not Currently  . Drug use: Not Currently  . Sexual activity: Not on file  Other Topics Concern  . Not on file  Social History Narrative  . Not on file   Social Determinants of Health   Financial Resource Strain: Not on file  Food Insecurity: Not on file  Transportation Needs: Not on file  Physical Activity: Not on file  Stress: Not on file  Social Connections: Not on file   Review of Systems: A 12 point ROS discussed and pertinent positives are indicated in the HPI above.  All other systems are negative.  Review of Systems  Constitutional: Negative for fatigue and fever.  HENT: Negative for congestion.   Respiratory: Negative for cough and shortness of breath.   Gastrointestinal: Negative for abdominal pain, diarrhea, nausea  and vomiting.    Vital Signs: BP 117/87   Pulse 83   Temp 98.3 F (36.8 C)   Resp (!) 24   Ht 5\' 5"  (1.651 m)   Wt 130 lb (59 kg) Comment: pt not sure, estimated by staff and referred to past weights documented couple weeks ago  SpO2 98%   BMI 21.63 kg/m   Physical Exam Vitals and nursing note reviewed.  Constitutional:      Appearance: She is well-developed.  HENT:     Head: Normocephalic and  atraumatic.  Eyes:     Conjunctiva/sclera: Conjunctivae normal.  Cardiovascular:     Rate and Rhythm: Normal rate and regular rhythm.  Pulmonary:     Effort: Pulmonary effort is normal.     Breath sounds: Normal breath sounds.  Musculoskeletal:        General: Normal range of motion.     Cervical back: Normal range of motion.  Skin:    General: Skin is warm.  Neurological:     Mental Status: She is alert and oriented to person, place, and time.     Imaging: DG Abd 1 View  Result Date: 04/28/2021 CLINICAL DATA:  NG tube placement EXAM: ABDOMEN - 1 VIEW COMPARISON:  CT 04/27/2021 FINDINGS: Esophageal tube tip in the left upper quadrant over the mid stomach. Mild diffuse increased bowel gas without obstructive pattern. IMPRESSION: Esophageal tube tip overlies the proximal to mid stomach. Electronically Signed   By: Donavan Foil M.D.   On: 04/28/2021 21:20   CT HEAD WO CONTRAST  Result Date: 04/28/2021 CLINICAL DATA:  Post biopsy EXAM: CT HEAD WITHOUT CONTRAST TECHNIQUE: Contiguous axial images were obtained from the base of the skull through the vertex without intravenous contrast. COMPARISON:  04/26/2021 FINDINGS: Brain: Small peripheral biopsy sites is present along the superolateral left temporal lobe containing air and blood products. Minimal extra-axial air is present along the cerebral convexity. Stable appearance of relatively hyperattenuating lesion centered within the left basal ganglia with contralateral extension and surrounding hypoattenuation corresponding to abnormal signal on MRI. There is stable rightward midline shift and trapping of the right lateral ventricle as well as partial effacement of basal cisterns. No new loss of gray-white differentiation. Vascular: No new findings. Skull: New small left craniotomy. Sinuses/Orbits: No acute abnormality Other: None. IMPRESSION: Expected post biopsy changes. Otherwise stable appearance of mass and associated mass effect. Electronically  Signed   By: Macy Mis M.D.   On: 04/28/2021 15:52   CT Head Wo Contrast  Result Date: 04/26/2021 CLINICAL DATA:  Disoriented, multiple falls, facial droop and weakness from previous stroke EXAM: CT HEAD WITHOUT CONTRAST TECHNIQUE: Contiguous axial images were obtained from the base of the skull through the vertex without intravenous contrast. COMPARISON:  None. FINDINGS: Brain: There is a large mass centered in the region of the left basal ganglia, measuring approximately 4.3 x 3.5 cm. Marked surrounding vasogenic edema throughout the left cerebral hemisphere, with marked mass effect and rightward midline shift measuring approximately 14 mm at the level of the septum pellucidum. There is effacement the basilar cisterns. MRI is recommended for further evaluation. No acute hemorrhage. Marked effacement of the lateral ventricles by the mass effect described above. No acute extra-axial fluid collections. Vascular: No hyperdense vessel or unexpected calcification. Skull: Normal. Negative for fracture or focal lesion. Sinuses/Orbits: No acute finding. Other: None. IMPRESSION: 1. Large heterogeneous mass centered in the region of the left basal ganglia, with significant surrounding vasogenic edema and mass effect. MRI  is recommended for further evaluation. 2. No acute infarct or hemorrhage. Critical Value/emergent results were called by telephone at the time of interpretation on 04/26/2021 at 11:15 pm to provider [facility] , who verbally acknowledged these results. Electronically Signed   By: Randa Ngo M.D.   On: 04/26/2021 23:15   MR Brain W and Wo Contrast  Result Date: 04/27/2021 CLINICAL DATA:  Brain mass EXAM: MRI HEAD WITHOUT AND WITH CONTRAST TECHNIQUE: Multiplanar, multiecho pulse sequences of the brain and surrounding structures were obtained without and with intravenous contrast. CONTRAST:  42mL GADAVIST GADOBUTROL 1 MMOL/ML IV SOLN COMPARISON:  Head CT same day FINDINGS: Brain: There is  multifocal abnormal contrast enhancement centered within the left basal ganglia and extending into the left temporal lobe and crossing the midline at the level of the fornix. There is a large amount of surrounding hyperintense T2-weighted signal. At the level of the foramina of [place], there is rightward midline shift measuring 12 mm. The contrast enhancement pattern is predominantly solid. Vascular: Major flow voids are preserved. Skull and upper cervical spine: Normal calvarium and skull base. Visualized upper cervical spine and soft tissues are normal. Sinuses/Orbits:No paranasal sinus fluid levels or advanced mucosal thickening. No mastoid or middle ear effusion. Normal orbits. IMPRESSION: 1. Multifocal abnormal contrast enhancement centered within the left basal ganglia and extending into the left temporal lobe, crossing the midline at the level of the fornix. 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 The lack of necrosis is not characteristic of glioblastoma, which remains the second consideration. 2. Rightward midline shift measuring 12 mm. Electronically Signed   By: Ulyses Jarred M.D.   On: 04/27/2021 00:37   CT CHEST ABDOMEN PELVIS W CONTRAST  Result Date: 04/27/2021 CLINICAL DATA:  Metastatic disease evaluation, history of possible lymphoma in a 70 year old female found to have abnormality on brain imaging. EXAM: CT CHEST, ABDOMEN, AND PELVIS WITH CONTRAST TECHNIQUE: Multidetector CT imaging of the chest, abdomen and pelvis was performed following the standard protocol during bolus administration of intravenous contrast. CONTRAST:  42mL OMNIPAQUE IOHEXOL 300 MG/ML  SOLN COMPARISON:  Only brain imaging is available for comparison. FINDINGS: CT CHEST FINDINGS Cardiovascular: Scattered aortic atherosclerosis. No aneurysmal dilation of the thoracic aorta. Heart size normal without pericardial effusion. Central pulmonary vasculature is normal caliber. Mediastinum/Nodes: Esophagus mildly patulous. No thoracic  inlet lymphadenopathy. No axillary lymphadenopathy. No hilar or mediastinal lymphadenopathy. Lungs/Pleura: No consolidation or pleural effusion. Atelectasis at the lung bases. Airways are patent. No suspicious mass or nodule. Musculoskeletal: See below for full musculoskeletal details. CT ABDOMEN PELVIS FINDINGS Hepatobiliary: Low-density lesions in the liver, for instance on image 52 of series 2, well-circumscribed area in the RIGHT hepatic lobe measuring approximately 11 mm and another subcentimeter area in the anterior LEFT hepatic lobe, an additional 1.7 cm lesion in the posterior RIGHT hepatic lobe clearly with water density and well-circumscribed margins. These are compatible with cysts. No pericholecystic stranding. The portal vein is patent. No biliary duct dilation. Pancreas: Normal, without mass, inflammation or ductal dilatation. Spleen: Normal spleen. Adrenals/Urinary Tract: Adrenal glands are normal. Symmetric renal enhancement. No suspicious renal lesion. No hydronephrosis. The urinary bladder is under distended limiting assessment. Stomach/Bowel: Mild gastric thickening versus under distension. Small bowel is normal caliber without signs of adjacent inflammation or visible lesion, limited assessment due to lack of contrast. No signs of colonic obstruction or acute colonic inflammation. No pericecal stranding. Appendix is normal. Vascular/Lymphatic: Tortuous nonaneurysmal abdominal aorta with mild atherosclerotic plaque. Mildly dilated LEFT common iliac  artery up to 1.5 cm also tortuous. Smooth contour of the IVC. There is no gastrohepatic or hepatoduodenal ligament lymphadenopathy. No retroperitoneal or mesenteric lymphadenopathy. Engorgement of LEFT ovarian vein. No pelvic sidewall lymphadenopathy. Reproductive: Engorgement of parametrial veins and LEFT ovarian vein as described. LEFT renal vein is patent. Other: No ascites. Musculoskeletal: No acute bone finding. No destructive bone process. Spinal  degenerative changes. IMPRESSION: 1. No adenopathy in the chest, abdomen or pelvis. No signs of neoplasm. 2. Mild gastric thickening versus under distension. Correlate with any symptoms of gastritis. 3. Engorgement of parametrial veins and LEFT ovarian vein, nonspecific but can be seen in the setting of pelvic congestion syndrome. 4. Aortic atherosclerosis. Aortic Atherosclerosis (ICD10-I70.0) and Emphysema (ICD10-J43.9). Electronically Signed   By: Zetta Bills M.D.   On: 04/27/2021 10:18    Labs:  CBC: Recent Labs    04/27/21 0312 04/28/21 0326 04/30/21 0141 05/01/21 0510  WBC 7.3 8.8 13.3* 10.2  HGB 13.7 14.1 14.3 13.4  HCT 40.0 40.3 42.0 40.0  PLT 194 213 229 197    COAGS: No results for input(s): INR, APTT in the last 8760 hours.  BMP: Recent Labs    04/30/21 1140 04/30/21 1643 05/01/21 0510 05/01/21 1657  NA 145 145 144 139  K 4.0 4.0 3.7 4.0  CL 113* 111 111 109  CO2 25 27 26 24   GLUCOSE 121* 102* 101* 116*  BUN 20 19 20  26*  CALCIUM 9.3 9.3 9.2 9.1  CREATININE 0.64 0.77 0.60 0.68  GFRNONAA >60 >60 >60 >60    LIVER FUNCTION TESTS: Recent Labs    04/28/21 2245  BILITOT 1.2  AST 19  ALT 18  ALKPHOS 25*  PROT 6.9  ALBUMIN 3.8    Assessment and Plan:  70 y.o. female outpatient. History of HTN and TIA. Recently admitted for unsteady gait and recurrent falls.  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 Team is requesting a portacath placement for chemotherapy access.  CT Chest from 5.5.22 shows RIJ is accessible.  Allergies unknown. Labs from 5.9.22 unremarkable. Patient is on 81 mg of ASA. All other medications are within acceptable parameters. NPO since midnight.   Risks and benefits of image guided port-a-catheter placement was discussed with the patient including, but not limited to bleeding, infection, pneumothorax, or fibrin sheath development and need for additional procedures.  All of the patient's questions were answered, patient is  agreeable to proceed. Consent signed and in chart.   Thank you for this interesting consult.  I greatly enjoyed meeting Audrey Barron and look forward to participating in their care.  A copy of this report was sent to the requesting provider on this date.  Electronically Signed: Jacqualine Mau, NP 05/12/2021, 8:18 AM   I spent a total of  30 Minutes   in face to face in clinical consultation, greater than 50% of which was counseling/coordinating care for portacath placement

## 2021-05-15 ENCOUNTER — Ambulatory Visit
Admission: RE | Admit: 2021-05-15 | Discharge: 2021-05-15 | Disposition: A | Discharge status: Home / Self Care | Payer: Medicare HMO | Source: Ambulatory Visit | Attending: Internal Medicine | Admitting: Internal Medicine

## 2021-05-15 ENCOUNTER — Other Ambulatory Visit: Payer: Self-pay

## 2021-05-15 DIAGNOSIS — C8589 Other specified types of non-Hodgkin lymphoma, extranodal and solid organ sites: Secondary | ICD-10-CM

## 2021-05-15 DIAGNOSIS — I700 Atherosclerosis of aorta: Secondary | ICD-10-CM | POA: Insufficient documentation

## 2021-05-15 LAB — GLUCOSE, CAPILLARY: Glucose-Capillary: 91 mg/dL (ref 70–99)

## 2021-05-15 IMAGING — CT NM PET TUM IMG INITIAL (PI) SKULL BASE T - THIGH
1 of 9 series · 1 of 25 positions shown · non-contrast
Comparison: Head CT [DATE]. Chest abdomen pelvis CT [DATE].

CLINICAL DATA: Initial treatment strategy for CNS lymphoma.

EXAM:
NUCLEAR MEDICINE PET SKULL BASE TO THIGH
TECHNIQUE: 6.9 mCi F-18 FDG was injected intravenously. Full-ring PET imaging
was performed from the skull base to thigh after the radiotracer. CT
data was obtained and used for attenuation correction and anatomic
localization.
Fasting blood glucose: 91 mg/dl

[Series 3: ct wb 5.0 b30f · axial · 5.0mm · 0.98mm/px · 1 of 290 slices shown]
[im 290/290  brain]
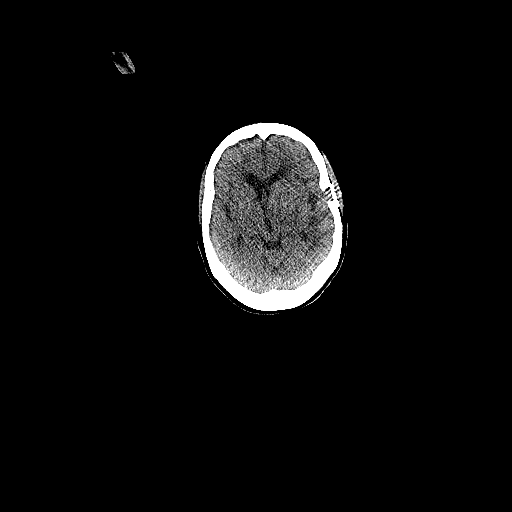

[1 of 25 positions shown; findings below may reference images not displayed]

FINDINGS: Mediastinal blood pool activity: SUV max

Liver activity: SUV max NA

NECK: Images which included the lower brain show asymmetric
hypermetabolism in the midline and left hemisphere. No
hypermetabolic lymphadenopathy in the neck.

Incidental CT findings: none

CHEST: No hypermetabolic mediastinal or hilar nodes. No suspicious
pulmonary nodules on the CT scan.

Incidental CT findings: Right Port-A-Cath tip is positioned in the
distal SVC. Basilar atelectasis noted bilaterally.

ABDOMEN/PELVIS: No abnormal hypermetabolic activity within the
liver, pancreas, adrenal glands, or spleen. No hypermetabolic lymph
nodes in the abdomen or pelvis.

Incidental CT findings: Small scattered cysts noted. There is
abdominal aortic atherosclerosis without aneurysm.

SKELETON: No focal hypermetabolic activity to suggest skeletal
metastasis.

Incidental CT findings: none
IMPRESSION: 1. No unexpected or suspicious hypermetabolism in the neck, chest,
abdomen, or pelvis.

## 2021-05-15 MED ORDER — FLUDEOXYGLUCOSE F - 18 (FDG) INJECTION
6.7000 | Freq: Once | INTRAVENOUS | Status: AC | PRN
  Administered 2021-05-15: 6.89 via INTRAVENOUS

## 2021-05-17 ENCOUNTER — Ambulatory Visit
Admission: RE | Admit: 2021-05-17 | Discharge: 2021-05-17 | Disposition: A | Discharge status: Home / Self Care | Payer: Medicare HMO | Source: Ambulatory Visit | Attending: Internal Medicine | Admitting: Internal Medicine

## 2021-05-17 ENCOUNTER — Other Ambulatory Visit: Payer: Self-pay

## 2021-05-17 ENCOUNTER — Encounter: Payer: Self-pay | Admitting: Neurosurgery

## 2021-05-17 DIAGNOSIS — C8589 Other specified types of non-Hodgkin lymphoma, extranodal and solid organ sites: Secondary | ICD-10-CM | POA: Diagnosis not present

## 2021-05-17 IMAGING — MR MR TOTAL SPINE METS SCREENING
4 series · 25 of 48 positions shown · IV contrast (gadavist)
Comparison: None.

CLINICAL DATA: CNS lymphoma, staging

EXAM:
MRI TOTAL SPINE WITHOUT AND WITH CONTRAST
TECHNIQUE: Multisequence MR imaging of the spine from the cervical spine to the
sacrum was performed prior to and following IV contrast
administration for evaluation of spinal metastatic disease.
CONTRAST:  5mL GADAVIST GADOBUTROL 1 MMOL/ML IV SOLN

[Series 18: T1 · sagittal · 5.0mm · 1.29mm/px · 9 of 19 slices shown (1 of 2)]
[im 1/19]
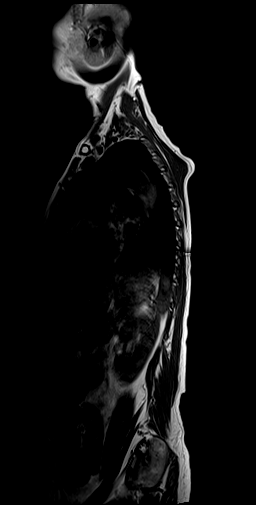
[im 4/19]
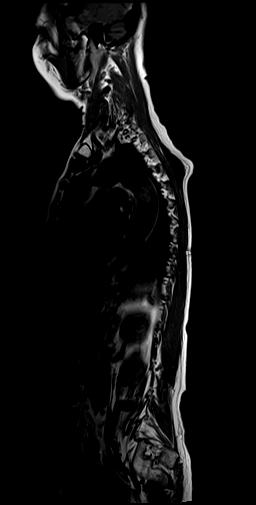
[im 5/19]
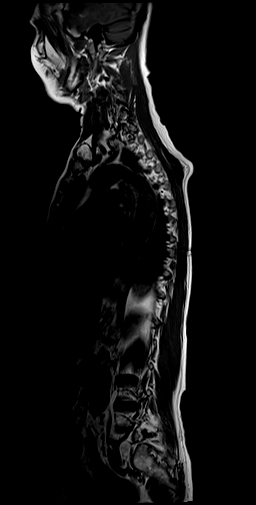
[im 9/19]
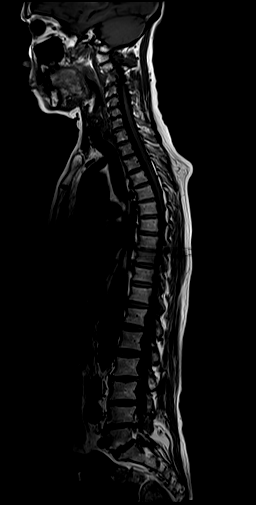
[im 10/19]
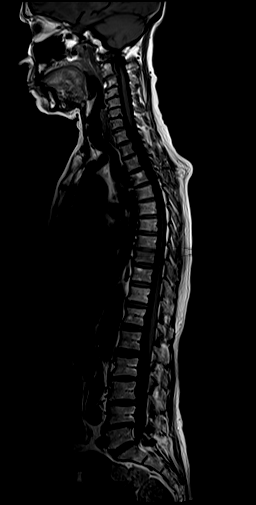
[im 14/19]
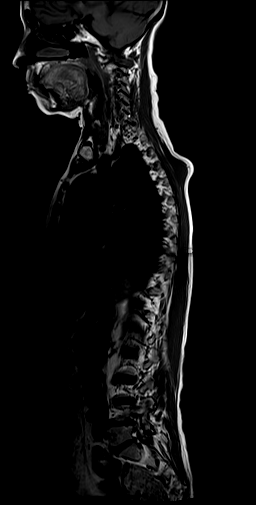
[im 15/19]
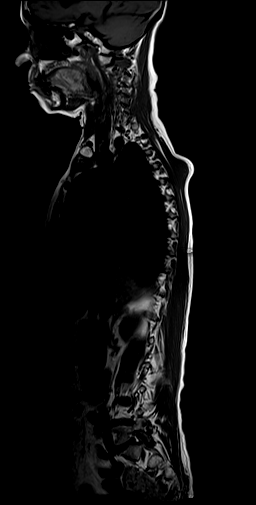
[im 17/19]
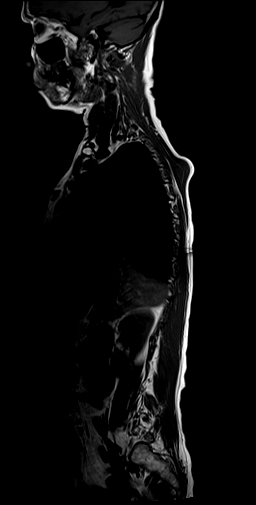
[im 19/19]
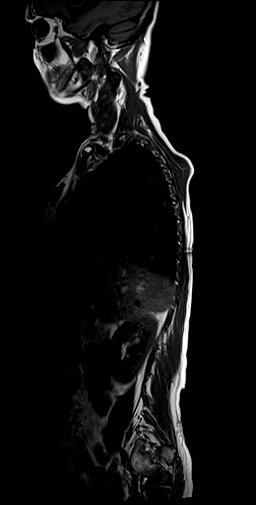

[Series 21: sag stir_comp · sagittal · 5.0mm · 0.64mm/px · 4 of 19 slices shown]
[im 1/19]
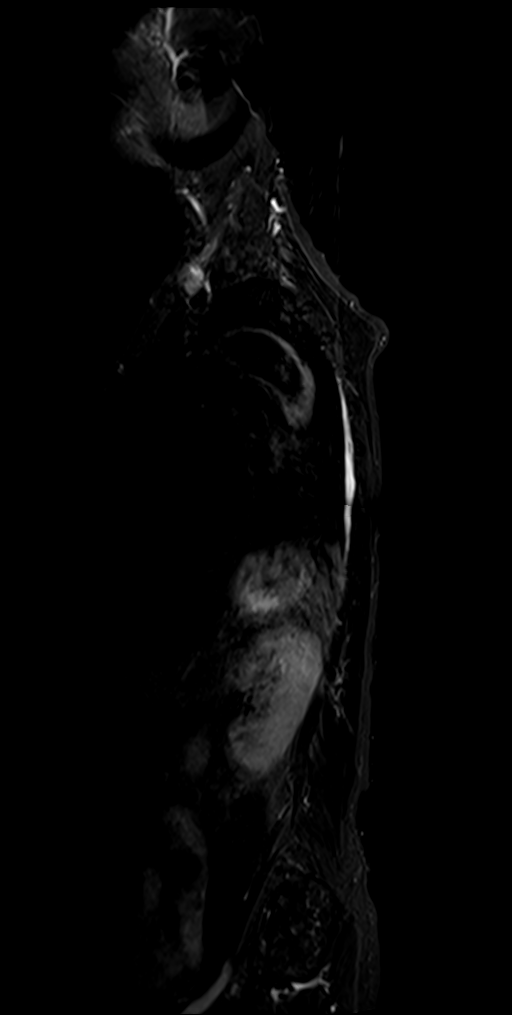
[im 4/19]
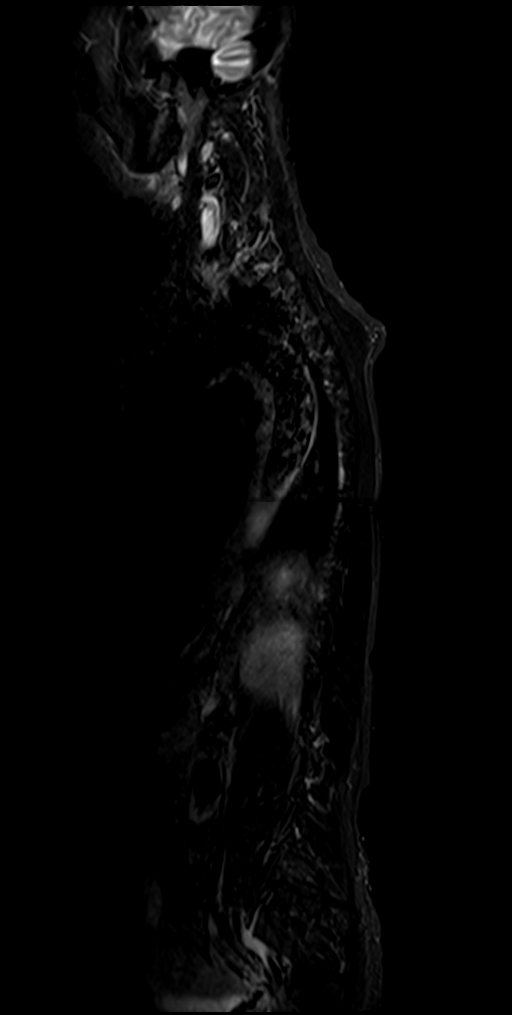
[im 10/19]
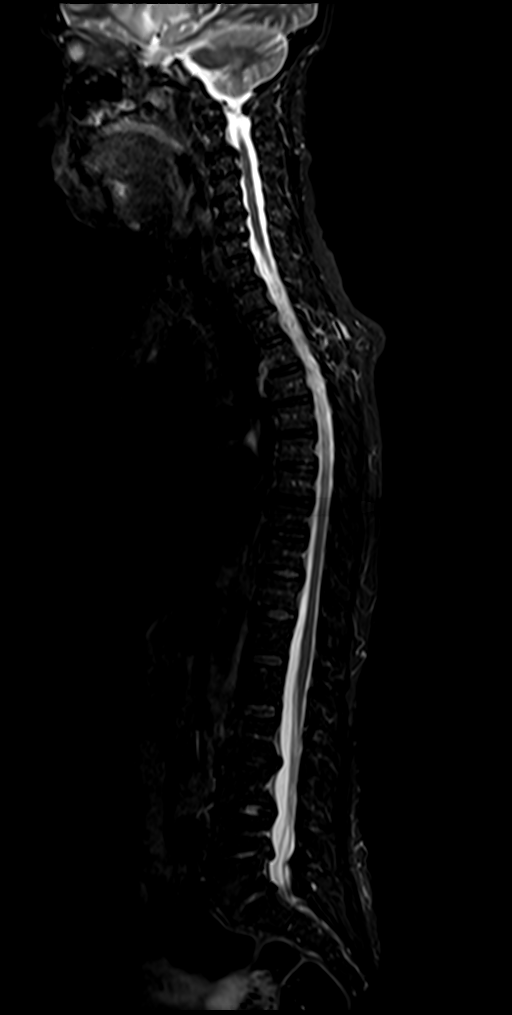
[im 17/19]
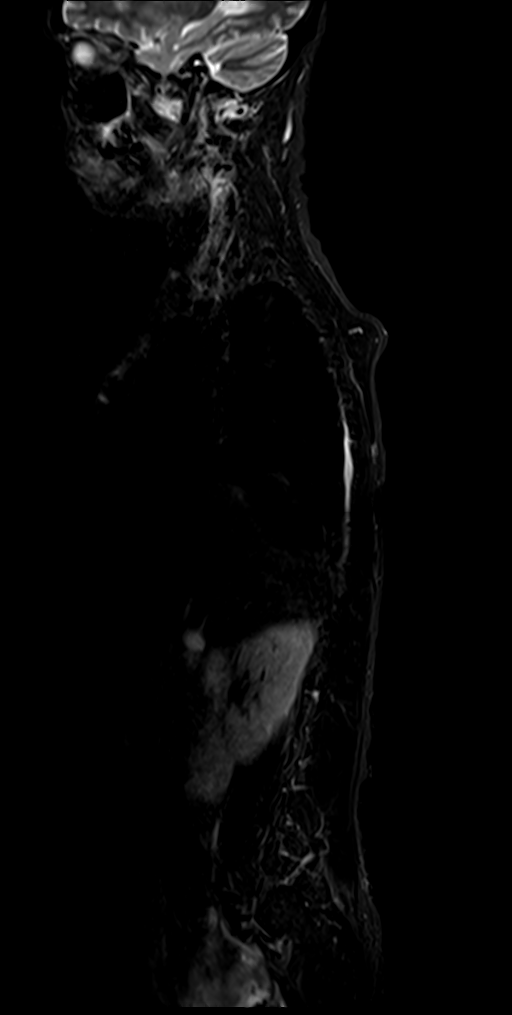

[Series 24: sag t2_comp · sagittal · 5.0mm · 1.29mm/px · 3 of 19 slices shown]
[im 4/19]
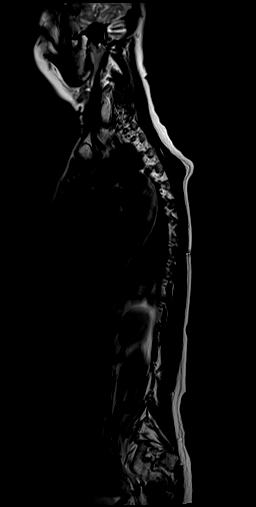
[im 10/19]
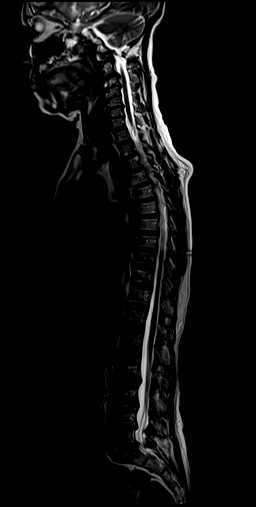
[im 17/19]
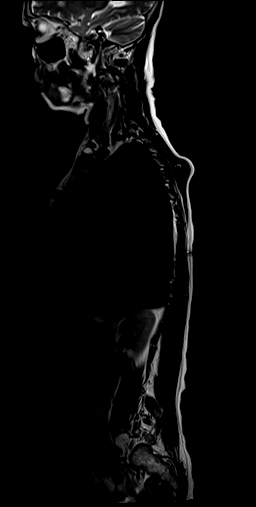

[Series 36: T1 · sagittal · 5.0mm · 1.29mm/px · 9 of 19 slices shown (2 of 2)]
[im 1/19]
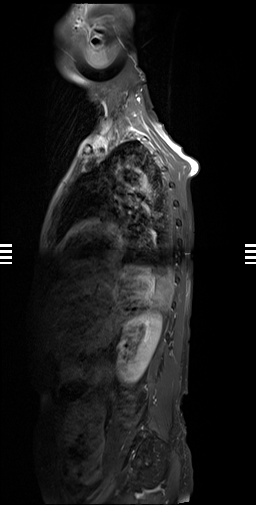
[im 4/19]
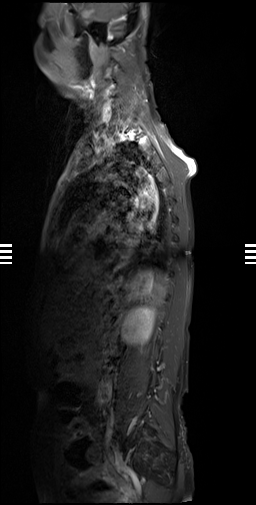
[im 5/19]
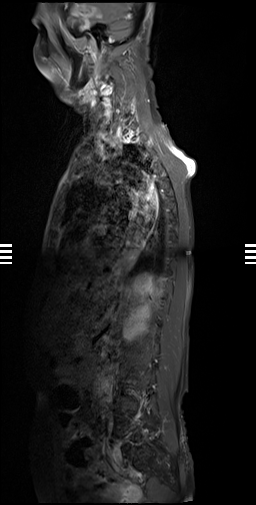
[im 9/19]
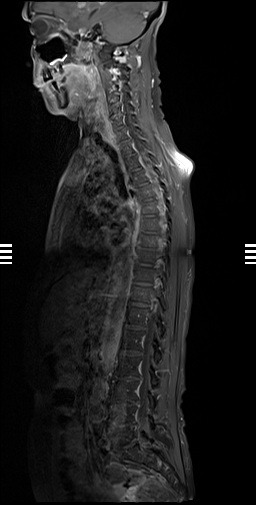
[im 10/19]
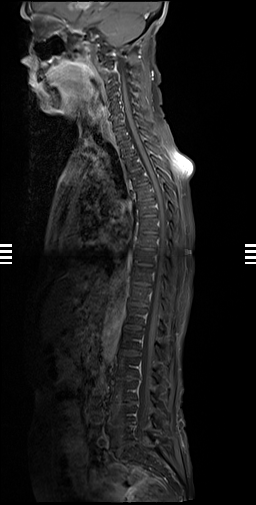
[im 14/19]
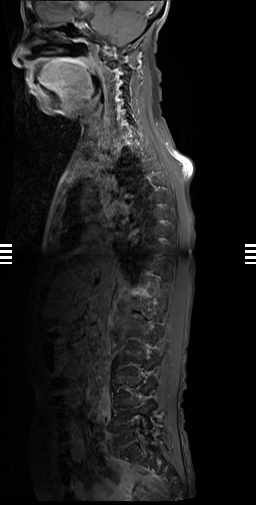
[im 15/19]
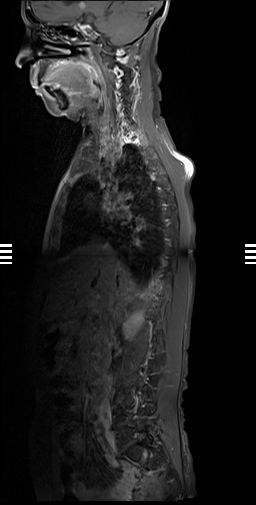
[im 17/19]
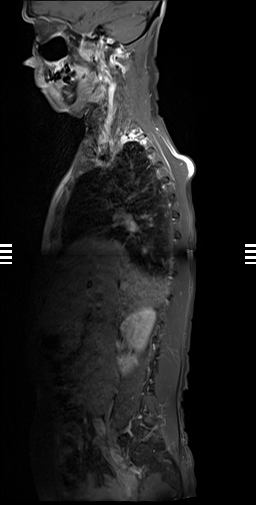
[im 19/19]
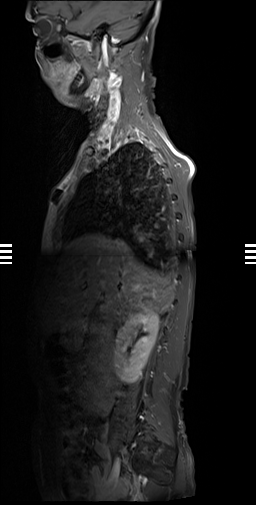

[25 of 48 positions shown; findings below may reference images not displayed]

FINDINGS: There is mild degenerative listhesis at cervical and lumbar levels.
Vertebral body heights are maintained apart from degenerative
endplate irregularity primarily at cervical and lower lumbar levels.
There is no marrow edema. No suspicious osseous lesion. No abnormal
cord signal on the large field-of-view images. No evidence of
abnormal leptomeningeal enhancement in the spine. Partially imaged
intracranial enhancing lesion. Multilevel degenerative changes are
present at cervical and lumbar levels without high-grade canal
narrowing. Cervical foramina are difficult to evaluate. There is
lumbar neural foraminal narrowing at L4-L5 and L5-S1.
IMPRESSION: No evidence of metastatic disease.

## 2021-05-17 MED ORDER — GADOBUTROL 1 MMOL/ML IV SOLN
5.0000 mL | Freq: Once | INTRAVENOUS | Status: AC | PRN
  Administered 2021-05-17: 5 mL via INTRAVENOUS

## 2021-05-19 ENCOUNTER — Inpatient Hospital Stay (HOSPITAL_BASED_OUTPATIENT_CLINIC_OR_DEPARTMENT_OTHER): Payer: Medicare HMO | Admitting: Internal Medicine

## 2021-05-19 ENCOUNTER — Inpatient Hospital Stay: Payer: Medicare HMO

## 2021-05-19 ENCOUNTER — Encounter: Payer: Self-pay | Admitting: Internal Medicine

## 2021-05-19 ENCOUNTER — Other Ambulatory Visit: Payer: Self-pay

## 2021-05-19 VITALS — BP 118/85 | HR 81 | Temp 98.2°F | Resp 20 | Wt 120.9 lb

## 2021-05-19 DIAGNOSIS — C8589 Other specified types of non-Hodgkin lymphoma, extranodal and solid organ sites: Secondary | ICD-10-CM

## 2021-05-19 DIAGNOSIS — C8519 Unspecified B-cell lymphoma, extranodal and solid organ sites: Secondary | ICD-10-CM | POA: Diagnosis not present

## 2021-05-19 MED ORDER — DEXAMETHASONE 4 MG PO TABS: 4.0000 mg | ORAL_TABLET | Freq: Every day | ORAL | 3 refills | Status: DC

## 2021-05-19 NOTE — Progress Notes (Signed)
START ON PATHWAY REGIMEN - Neuro     Cycle 1: A cycle is 28 days:     Methotrexate      Leucovorin      Rituximab-xxxx      Temozolomide    Cycle 2: A cycle is 28 days:     Methotrexate      Leucovorin      Rituximab-xxxx      Temozolomide    Cycles 3 and 4: A cycle is every 28 days:     Methotrexate      Leucovorin      Temozolomide   **Always confirm dose/schedule in your pharmacy ordering system**  Patient Characteristics: Primary CNS Lymphoma, Newly Diagnosed, Induction, Candidate for High-Dose Methotrexate Disease Classification: Primary CNS Lymphoma Disease Status: Newly Diagnosed Intent of Therapy: Curative Intent, Discussed with Patient

## 2021-05-19 NOTE — Progress Notes (Signed)
[facility] [place], [place] [address] [phone]   Interval Evaluation  Date of Service: 05/19/21 Patient Name: Audrey Barron Patient MRN: [id number] Patient DOB: 09-22-1951 Provider: Ventura Sellers, MD  Identifying Statement:  Audrey Barron is a 71 y.o. female with left hemispheric Primary CNS Lymphoma   Oncologic History: Oncology History  Primary CNS lymphoma ([place])  04/28/2021 Surgery   Open biopsy with Dr. Izora Ribas; path demonstrates CNS B-cell lymphoma, CD20+     Biomarkers:  CD20 positive.  Bcl-6 positive  Ki-67 95%      Interval History:  Audrey Barron presents today for follow up after completing lymphoma staging.  She and her daughter actually describe modest improvement in weakness and language impairment.  She is walking some on her own throughout the home, whereas previously she required full assistance.  Speaking is a certainly more clear, though still has some trouble following directions and communicating her needs.  Denies seizures, headaches.  No new deficits are described.  No difficulty with any of the staging workup, including port placement.  H+P (05/05/21) Patient presented to medical attention in April, with 2-3 months history of confusion, impaired speech, balance and gait impairment.  Neurologist ordered an MRI, which demonstrated infiltrative tumor within much of left hemisphere.  She underwent craniotomy and open biopsy on 04/28/21 with Dr. Izora Ribas at [facility], which yielded a B-cell CNS Lymphoma.  Following surgery, she has continued to experience difficulty speaking, weakness on the right side, and poor balance.  She is unable to walk without assistance at home.  At present she is reliant on her daughter for help with dressing, cooking, tolieting; a majority of activities of daily living.  She has been dosing decadron 17m three times per day; this has not clearly led to any improvements  in functional status.   Medications: Current Outpatient Medications on File Prior to Visit  Medication Sig Dispense Refill  . cyanocobalamin 1000 MCG tablet Take 1,000 mcg by mouth daily.    .Marland Kitchendexamethasone (DECADRON) 4 MG tablet Take 1 tablet (4 mg total) by mouth 3 (three) times daily. 30 tablet 0  . diphenhydrAMINE (BENADRYL) 50 MG capsule Take 1 capsule (50 mg total) by mouth at bedtime as needed for sleep. 30 capsule 0  . levETIRAcetam (KEPPRA) 500 MG tablet Take 1 tablet (500 mg total) by mouth 2 (two) times daily. 60 tablet 0  . pantoprazole (PROTONIX) 40 MG tablet Take 1 tablet (40 mg total) by mouth at bedtime. 30 tablet 0  . polyethylene glycol (MIRALAX / GLYCOLAX) 17 g packet Take 17 g by mouth daily. 14 each 0   No current facility-administered medications on file prior to visit.    Allergies: Not on File Past Medical History:  Past Medical History:  Diagnosis Date  . Cancer ([place]    lymphoma  . Dementia ([facility]    Past Surgical History:  Past Surgical History:  Procedure Laterality Date  . APPLICATION OF CRANIAL NAVIGATION N/A 04/28/2021   Procedure: APPLICATION OF CRANIAL NAVIGATION;  Surgeon: YMeade Maw MD;  Location: [facility] ORS;  Service: Neurosurgery;  Laterality: N/A;  . CRANIOTOMY Left 04/28/2021   Procedure: CRANIOTOMY TUMOR EXCISION;  Surgeon: YMeade Maw MD;  Location: [facility] ORS;  Service: Neurosurgery;  Laterality: Left;  . IR IMAGING GUIDED PORT INSERTION  05/12/2021   Social History:  Social History   Socioeconomic History  . Marital status: Single    Spouse name: Not on file  .  Number of children: Not on file  . Years of education: Not on file  . Highest education level: Not on file  Occupational History  . Not on file  Tobacco Use  . Smoking status: Never Smoker  . Smokeless tobacco: Never Used  Substance and Sexual Activity  . Alcohol use: Not Currently  . Drug use: Not Currently  . Sexual activity: Not on file  Other Topics Concern   . Not on file  Social History Narrative  . Not on file   Social Determinants of Health   Financial Resource Strain: Not on file  Food Insecurity: Not on file  Transportation Needs: Not on file  Physical Activity: Not on file  Stress: Not on file  Social Connections: Not on file  Intimate Partner Violence: Not on file   Family History: No family history on file.  Review of Systems: Constitutional: Doesn't report fevers, chills or abnormal weight loss Eyes: Doesn't report blurriness of vision Ears, nose, mouth, throat, and face: Doesn't report sore throat Respiratory: Doesn't report cough, dyspnea or wheezes Cardiovascular: Doesn't report palpitation, chest discomfort  Gastrointestinal: +constipation GU: Doesn't report incontinence Skin: Doesn't report skin rashes Neurological: Per HPI Musculoskeletal: Doesn't report joint pain Behavioral/Psych: Doesn't report anxiety  Physical Exam: Vitals:   05/19/21 1012 05/19/21 1014  BP:  118/85  Pulse:  81  Resp: 20   Temp: 98.2 F (36.8 C)    KPS: 60. General: Alert, cooperative, pleasant, in no acute distress Head: Normal EENT: No conjunctival injection or scleral icterus.  Lungs: Resp effort normal Cardiac: Regular rate Abdomen: Non-distended abdomen Skin: No rashes cyanosis or petechiae. Extremities: No clubbing or edema  Neurologic Exam: Mental Status: Awake, alert, attentive to examiner. Oriented to self and environment. Language is mildly impaired with regards to both fluency and comprehension.  Further mental status evaluation limited by dysphasia. Cranial Nerves: Visual acuity is grossly normal. Visual fields are full. Extra-ocular movements intact. No ptosis. Face is symmetric Motor: Tone and bulk are normal. Power is 4+/5 in right arm and leg. Reflexes are symmetric, no pathologic reflexes present.  Sensory: Intact to light touch Gait: Dystaxic   Labs: I have reviewed the data as listed    Component Value  Date/Time   NA 139 05/01/2021 1657   K 4.0 05/01/2021 1657   CL 109 05/01/2021 1657   CO2 24 05/01/2021 1657   GLUCOSE 116 (H) 05/01/2021 1657   BUN 26 (H) 05/01/2021 1657   CREATININE 0.68 05/01/2021 1657   CALCIUM 9.1 05/01/2021 1657   PROT 6.9 04/28/2021 2245   ALBUMIN 3.8 04/28/2021 2245   AST 19 04/28/2021 2245   ALT 18 04/28/2021 2245   ALKPHOS 25 (L) 04/28/2021 2245   BILITOT 1.2 04/28/2021 2245   GFRNONAA >60 05/01/2021 1657   Lab Results  Component Value Date   WBC 10.2 05/01/2021   HGB 13.4 05/01/2021   HCT 40.0 05/01/2021   MCV 87.1 05/01/2021   PLT 197 05/01/2021    Imaging:  DG Abd 1 View  Result Date: 04/28/2021 CLINICAL DATA:  NG tube placement EXAM: ABDOMEN - 1 VIEW COMPARISON:  CT 04/27/2021 FINDINGS: Esophageal tube tip in the left upper quadrant over the mid stomach. Mild diffuse increased bowel gas without obstructive pattern. IMPRESSION: Esophageal tube tip overlies the proximal to mid stomach. Electronically Signed   By: Donavan Foil M.D.   On: 04/28/2021 21:20   CT HEAD WO CONTRAST  Result Date: 04/28/2021 CLINICAL DATA:  Post biopsy EXAM: CT  HEAD WITHOUT CONTRAST TECHNIQUE: Contiguous axial images were obtained from the base of the skull through the vertex without intravenous contrast. COMPARISON:  04/26/2021 FINDINGS: Brain: Small peripheral biopsy sites is present along the superolateral left temporal lobe containing air and blood products. Minimal extra-axial air is present along the cerebral convexity. Stable appearance of relatively hyperattenuating lesion centered within the left basal ganglia with contralateral extension and surrounding hypoattenuation corresponding to abnormal signal on MRI. There is stable rightward midline shift and trapping of the right lateral ventricle as well as partial effacement of basal cisterns. No new loss of gray-white differentiation. Vascular: No new findings. Skull: New small left craniotomy. Sinuses/Orbits: No acute  abnormality Other: None. IMPRESSION: Expected post biopsy changes. Otherwise stable appearance of mass and associated mass effect. Electronically Signed   By: Macy Mis M.D.   On: 04/28/2021 15:52   CT Head Wo Contrast  Result Date: 04/26/2021 CLINICAL DATA:  Disoriented, multiple falls, facial droop and weakness from previous stroke EXAM: CT HEAD WITHOUT CONTRAST TECHNIQUE: Contiguous axial images were obtained from the base of the skull through the vertex without intravenous contrast. COMPARISON:  None. FINDINGS: Brain: There is a large mass centered in the region of the left basal ganglia, measuring approximately 4.3 x 3.5 cm. Marked surrounding vasogenic edema throughout the left cerebral hemisphere, with marked mass effect and rightward midline shift measuring approximately 14 mm at the level of the septum pellucidum. There is effacement the basilar cisterns. MRI is recommended for further evaluation. No acute hemorrhage. Marked effacement of the lateral ventricles by the mass effect described above. No acute extra-axial fluid collections. Vascular: No hyperdense vessel or unexpected calcification. Skull: Normal. Negative for fracture or focal lesion. Sinuses/Orbits: No acute finding. Other: None. IMPRESSION: 1. Large heterogeneous mass centered in the region of the left basal ganglia, with significant surrounding vasogenic edema and mass effect. MRI is recommended for further evaluation. 2. No acute infarct or hemorrhage. Critical Value/emergent results were called by telephone at the time of interpretation on 04/26/2021 at 11:15 pm to provider [facility] , who verbally acknowledged these results. Electronically Signed   By: Randa Ngo M.D.   On: 04/26/2021 23:15   MR Brain W and Wo Contrast  Result Date: 04/27/2021 CLINICAL DATA:  Brain mass EXAM: MRI HEAD WITHOUT AND WITH CONTRAST TECHNIQUE: Multiplanar, multiecho pulse sequences of the brain and surrounding structures were obtained  without and with intravenous contrast. CONTRAST:  66m GADAVIST GADOBUTROL 1 MMOL/ML IV SOLN COMPARISON:  Head CT same day FINDINGS: Brain: There is multifocal abnormal contrast enhancement centered within the left basal ganglia and extending into the left temporal lobe and crossing the midline at the level of the fornix. There is a large amount of surrounding hyperintense T2-weighted signal. At the level of the foramina of [place], there is rightward midline shift measuring 12 mm. The contrast enhancement pattern is predominantly solid. Vascular: Major flow voids are preserved. Skull and upper cervical spine: Normal calvarium and skull base. Visualized upper cervical spine and soft tissues are normal. Sinuses/Orbits:No paranasal sinus fluid levels or advanced mucosal thickening. No mastoid or middle ear effusion. Normal orbits. IMPRESSION: 1. Multifocal abnormal contrast enhancement centered within the left basal ganglia and extending into the left temporal lobe, crossing the midline at the level of the fornix. 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 The lack of necrosis is not characteristic of glioblastoma, which remains the second consideration. 2. Rightward midline shift measuring 12 mm. Electronically Signed   By: KLennette Bihari  Collins [place] M.D.   On: 04/27/2021 00:37   CT CHEST ABDOMEN PELVIS W CONTRAST  Result Date: 04/27/2021 CLINICAL DATA:  Metastatic disease evaluation, history of possible lymphoma in a 70 year old female found to have abnormality on brain imaging. EXAM: CT CHEST, ABDOMEN, AND PELVIS WITH CONTRAST TECHNIQUE: Multidetector CT imaging of the chest, abdomen and pelvis was performed following the standard protocol during bolus administration of intravenous contrast. CONTRAST:  73m OMNIPAQUE IOHEXOL 300 MG/ML  SOLN COMPARISON:  Only brain imaging is available for comparison. FINDINGS: CT CHEST FINDINGS Cardiovascular: Scattered aortic atherosclerosis. No aneurysmal dilation of the thoracic aorta. Heart  size normal without pericardial effusion. Central pulmonary vasculature is normal caliber. Mediastinum/Nodes: Esophagus mildly patulous. No thoracic inlet lymphadenopathy. No axillary lymphadenopathy. No hilar or mediastinal lymphadenopathy. Lungs/Pleura: No consolidation or pleural effusion. Atelectasis at the lung bases. Airways are patent. No suspicious mass or nodule. Musculoskeletal: See below for full musculoskeletal details. CT ABDOMEN PELVIS FINDINGS Hepatobiliary: Low-density lesions in the liver, for instance on image 52 of series 2, well-circumscribed area in the RIGHT hepatic lobe measuring approximately 11 mm and another subcentimeter area in the anterior LEFT hepatic lobe, an additional 1.7 cm lesion in the posterior RIGHT hepatic lobe clearly with water density and well-circumscribed margins. These are compatible with cysts. No pericholecystic stranding. The portal vein is patent. No biliary duct dilation. Pancreas: Normal, without mass, inflammation or ductal dilatation. Spleen: Normal spleen. Adrenals/Urinary Tract: Adrenal glands are normal. Symmetric renal enhancement. No suspicious renal lesion. No hydronephrosis. The urinary bladder is under distended limiting assessment. Stomach/Bowel: Mild gastric thickening versus under distension. Small bowel is normal caliber without signs of adjacent inflammation or visible lesion, limited assessment due to lack of contrast. No signs of colonic obstruction or acute colonic inflammation. No pericecal stranding. Appendix is normal. Vascular/Lymphatic: Tortuous nonaneurysmal abdominal aorta with mild atherosclerotic plaque. Mildly dilated LEFT common iliac artery up to 1.5 cm also tortuous. Smooth contour of the IVC. There is no gastrohepatic or hepatoduodenal ligament lymphadenopathy. No retroperitoneal or mesenteric lymphadenopathy. Engorgement of LEFT ovarian vein. No pelvic sidewall lymphadenopathy. Reproductive: Engorgement of parametrial veins and LEFT  ovarian vein as described. LEFT renal vein is patent. Other: No ascites. Musculoskeletal: No acute bone finding. No destructive bone process. Spinal degenerative changes. IMPRESSION: 1. No adenopathy in the chest, abdomen or pelvis. No signs of neoplasm. 2. Mild gastric thickening versus under distension. Correlate with any symptoms of gastritis. 3. Engorgement of parametrial veins and LEFT ovarian vein, nonspecific but can be seen in the setting of pelvic congestion syndrome. 4. Aortic atherosclerosis. Aortic Atherosclerosis (ICD10-I70.0) and Emphysema (ICD10-J43.9). Electronically Signed   By: GZetta BillsM.D.   On: 04/27/2021 10:18   NM PET Image Initial (PI) Skull Base To Thigh  Result Date: 05/16/2021 CLINICAL DATA:  Initial treatment strategy for CNS lymphoma. EXAM: NUCLEAR MEDICINE PET SKULL BASE TO THIGH TECHNIQUE: 6.9 mCi F-18 FDG was injected intravenously. Full-ring PET imaging was performed from the skull base to thigh after the radiotracer. CT data was obtained and used for attenuation correction and anatomic localization. Fasting blood glucose: 91 mg/dl COMPARISON:  Head CT 04/28/2021. Chest abdomen pelvis CT 04/27/2021. FINDINGS: Mediastinal blood pool activity: SUV max 2.0 Liver activity: SUV max NA NECK: Images which included the lower brain show asymmetric hypermetabolism in the midline and left hemisphere. No hypermetabolic lymphadenopathy in the neck. Incidental CT findings: none CHEST: No hypermetabolic mediastinal or hilar nodes. No suspicious pulmonary nodules on the CT scan. Incidental CT findings: Right Port-A-Cath tip  is positioned in the distal SVC. Basilar atelectasis noted bilaterally. ABDOMEN/PELVIS: No abnormal hypermetabolic activity within the liver, pancreas, adrenal glands, or spleen. No hypermetabolic lymph nodes in the abdomen or pelvis. Incidental CT findings: Small scattered cysts noted. There is abdominal aortic atherosclerosis without aneurysm. SKELETON: No focal  hypermetabolic activity to suggest skeletal metastasis. Incidental CT findings: none IMPRESSION: 1. No unexpected or suspicious hypermetabolism in the neck, chest, abdomen, or pelvis. Electronically Signed   By: Misty Stanley M.D.   On: 05/16/2021 11:41   MR TOTAL SPINE METS SCREENING  Result Date: 05/17/2021 CLINICAL DATA:  CNS lymphoma, staging EXAM: MRI TOTAL SPINE WITHOUT AND WITH CONTRAST TECHNIQUE: Multisequence MR imaging of the spine from the cervical spine to the sacrum was performed prior to and following IV contrast administration for evaluation of spinal metastatic disease. CONTRAST:  3m GADAVIST GADOBUTROL 1 MMOL/ML IV SOLN COMPARISON:  None. FINDINGS: There is mild degenerative listhesis at cervical and lumbar levels. Vertebral body heights are maintained apart from degenerative endplate irregularity primarily at cervical and lower lumbar levels. There is no marrow edema. No suspicious osseous lesion. No abnormal cord signal on the large field-of-view images. No evidence of abnormal leptomeningeal enhancement in the spine. Partially imaged intracranial enhancing lesion. Multilevel degenerative changes are present at cervical and lumbar levels without high-grade canal narrowing. Cervical foramina are difficult to evaluate. There is lumbar neural foraminal narrowing at L4-L5 and L5-S1. IMPRESSION: No evidence of metastatic disease. Electronically Signed   By: PMacy MisM.D.   On: 05/17/2021 14:44   IR IMAGING GUIDED PORT INSERTION  Result Date: 05/12/2021 INDICATION: 70year-old female with history of intracranial lymphoma requiring central venous access for chemotherapy. EXAM: IMPLANTED PORT A CATH PLACEMENT WITH ULTRASOUND AND FLUOROSCOPIC GUIDANCE COMPARISON:  None. MEDICATIONS: None. ANESTHESIA/SEDATION: Moderate (conscious) sedation was employed during this procedure. A total of Versed 2 mg and Fentanyl 100 mcg was administered intravenously. Moderate Sedation Time: 18 minutes. The  patient's level of consciousness and vital signs were monitored continuously by radiology nursing throughout the procedure under my direct supervision. CONTRAST:  None FLUOROSCOPY TIME:  0.1 minutes, (01.79mGy) COMPLICATIONS: None immediate. PROCEDURE: The procedure, risks, benefits, and alternatives were explained to the patient. Questions regarding the procedure were encouraged and answered. The patient understands and consents to the procedure. The right neck and chest were prepped with chlorhexidine in a sterile fashion, and a sterile drape was applied covering the operative field. Maximum barrier sterile technique with sterile gowns and gloves were used for the procedure. A timeout was performed prior to the initiation of the procedure. Ultrasound was used to examine the jugular vein which was compressible and free of internal echoes. A skin marker was used to demarcate the planned venotomy and port pocket incision sites. Local anesthesia was provided to these sites and the subcutaneous tunnel track with 1% lidocaine with 1:100,000 epinephrine. A small incision was created at the jugular access site and blunt dissection was performed of the subcutaneous tissues. Under ultrasound guidance, the jugular vein was accessed with a 21 ga micropuncture needle and an 0.018" wire was inserted to the superior vena cava. Real-time ultrasound guidance was utilized for vascular access including the acquisition of a permanent ultrasound image documenting patency of the accessed vessel. A 5 Fr micopuncture set was then used, through which a 0.035" Rosen wire was passed under fluoroscopic guidance into the inferior vena cava. An 8 Fr dilator was then placed over the wire. A subcutaneous port pocket was then created along  the upper chest wall utilizing a combination of sharp and blunt dissection. The pocket was irrigated with sterile saline, packed with gauze, and observed for hemorrhage. A single lumen "ISP" sized power  injectable port was chosen for placement. The 8 Fr catheter was tunneled from the port pocket site to the venotomy incision. The port was placed in the pocket. The external catheter was trimmed to appropriate length. The dilator was exchanged for an 8 Fr peel-away sheath under fluoroscopic guidance. The catheter was then placed through the sheath and the sheath was removed. Final catheter positioning was confirmed and documented with a fluoroscopic spot radiograph. The port was accessed with a Huber needle, aspirated, and flushed with heparinized saline. The deep dermal layer of the port pocket incision was closed with interrupted 3-0 Vicryl suture. Dermabond was then placed over the port pocket and neck incisions. The patient tolerated the procedure well without immediate post procedural complication. FINDINGS: After catheter placement, the tip lies within the superior cavoatrial junction. The catheter aspirates and flushes normally and is ready for immediate use. IMPRESSION: Successful placement of a power injectable Port-A-Cath via the right internal jugular vein. The catheter is ready for immediate use. Ruthann Cancer, MD [facility] Electronically Signed   By: Ruthann Cancer MD   On: 05/12/2021 09:19    Pathology: SURGICAL PATHOLOGY  CASE: [id number]  PATIENT: Junita Push  Surgical Pathology Report   Specimen Submitted:  A. Temporal lesion, left   Clinical History: Left brain mass   DIAGNOSIS:  A. BRAIN, LEFT TEMPORAL LOBE; CRANIOTOMY WITH BIOPSY:  - HIGH GRADE B-CELL LYMPHOMA, COMPATIBLE WITH PRIMARY CNS LYMPHOMA.   Comment:  Biopsy sections display neural tissue predominantly replaced by an  abnormal proliferation of intermediate to large lymphocytes. These  lymphocytes display vesicular chromatin, prominent nucleoli, high N:C  ratio, and abnormal nuclear contours. Apoptotic debris is abundant. In  addition, there are  patchy areas of tumor necrosis.   Immunohistochemical studies show this abnormal proliferation to be  comprised of a large CD20+ B cells, with relatively few CD3+ T cells.  Abnormal B cells are positive for BCL-6 and MUM-1, and negative for  BCL-2. CD10 shows diffuse dim blush like staining, the significance of  which is unclear. Cyclin-D1 and CD30 are negative. c-myc is positive,  marking 50-60% of abnormal B cells. Ki-67 is significantly elevated,  with positivity in greater than 95% of abnormal B-cells. An  Epstein-Barr virus encoded RNA (EBER) ISH probe is negative.   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 Although CD10 positivity within this entity would  be rare, the absence of radiographic evidence of peripheral adenopathy  or other potential sites of involvement would favor a primary CNS  lymphoma. There is sufficient tissue present for ancillary molecular  testing if desired.    Assessment/Plan Primary CNS lymphoma ([place]) [C85.89]  We appreciate the opportunity to participate in the care of [facility].  She again presents with clinical, radiographic, histologic syndrome consistent with Primary B-cell CNS Lymphoma.    Today we reviewed her staging, including PET-CT, MRI total spine, slit lamp evaluation, serum analysis.  Staging fortunately does not demonstrate distal sites of lymphoma.  Bone marrow biopsy and CSF analysis have been omitted due to patient and provider preference.    HCV serum screening antibody test was positive, however.  We will follow this up with HCV RNA testing.  Rituximab component of treatment regimen may need to be altered if chronic HCV infection is demonstrated.  Impact of chronic HCV infection, if demonstrated, may not affect mortality but greatly increase risk of treatment related hepatotoxicity:  Daisuke Ennishi, et  al. Hepatic toxicity and prognosis in hepatitis C virus-infected patients with diffuse large B-cell lymphoma treated with rituximab-containing chemotherapy regimens: a [place] multicenter analysis. Blood 2010; 116 (24): [id number].   Central IV port catheter has been successfully placed (04/12/21) via interventional radiology for safe administration of chemotherapy.  We will plan for induction therapy with primarily inpatient high dose methotrexate with rituximab, and temozolomide, as discussed with the patient.  The goal of therapy will be complete response.  We discussed the method of delivery which will be inpatient administration for 3.5g/m2 MTX every 2-3 weeks for 6 cycles, with leucovorin rescue q6 hours after 24 hours.  Rituximab will be administered on day 3 of each cycle. Temozolomide will be administered at 117m/m2 for 5 days in one 28 day cycle following cycle 3 of MTX, with plan for 6 further cycles of 5-day Temozolomide as consolidation if complete response is obtained.  We will target initiation of therapy by 4 weeks post craniotomy.  We recommended she reduce decadron to 449monce daily if tolerated.  We can likely attribute her improvement in functional status to decadron, though we should still aim to minimize the effective dose if possible.    We will reach out to her once chemo is approved with [place] All questions were answered. The patient knows to call the clinic with any problems, questions or concerns. No barriers to learning were detected.  The total time spent in the encounter was 40 minutes and more than 50% was on counseling and review of test results   ZaVentura SellersMD Medical Director of Neuro-Oncology [facility] [place]5/27/22 9:59 AM

## 2021-05-19 NOTE — Progress Notes (Signed)
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 Patient and family deny any concerns today.

## 2021-05-21 LAB — HCV RNA QUANT RFLX ULTRA OR GENOTYP

## 2021-05-21 LAB — HEPATITIS C GENOTYPE

## 2021-05-21 LAB — HCV RNA (INTERNATIONAL UNITS)
HCV log10: 7.021 log10 IU/mL
Hcv Rna (International Units): 10500000 IU/mL

## 2021-05-23 ENCOUNTER — Telehealth: Payer: Self-pay | Admitting: *Deleted

## 2021-05-23 MED ORDER — PANTOPRAZOLE SODIUM 40 MG PO TBEC: 40.0000 mg | DELAYED_RELEASE_TABLET | Freq: Every day | ORAL | 2 refills | Status: DC

## 2021-05-23 NOTE — Addendum Note (Signed)
Addended by: Ventura Sellers on: 05/23/2021 03:19 PM   Modules accepted: Orders

## 2021-05-23 NOTE — Telephone Encounter (Signed)
Attempted to call daughter Anderson Malta  Back to advise that protonix was refilled to pharmacy.  VM full unable to leave message.

## 2021-05-23 NOTE — Telephone Encounter (Signed)
Patients daughter is asking if she has to continue the protonix (prescribed along with decadron).  If you feel she needs to continue they will need refill send to [facility].    Please advise.

## 2021-05-24 ENCOUNTER — Other Ambulatory Visit: Payer: Self-pay | Admitting: *Deleted

## 2021-05-24 ENCOUNTER — Inpatient Hospital Stay: Payer: Medicaid Other

## 2021-05-24 MED ORDER — PANTOPRAZOLE SODIUM 40 MG PO TBEC: 40.0000 mg | DELAYED_RELEASE_TABLET | Freq: Every day | ORAL | 2 refills | Status: DC

## 2021-05-26 ENCOUNTER — Other Ambulatory Visit (HOSPITAL_COMMUNITY)
Admission: RE | Admit: 2021-05-26 | Discharge: 2021-05-26 | Disposition: A | Discharge status: Home / Self Care | Payer: Medicare HMO | Source: Ambulatory Visit | Attending: Internal Medicine | Admitting: Internal Medicine

## 2021-05-26 DIAGNOSIS — Z01812 Encounter for preprocedural laboratory examination: Secondary | ICD-10-CM | POA: Insufficient documentation

## 2021-05-26 DIAGNOSIS — Z20822 Contact with and (suspected) exposure to covid-19: Secondary | ICD-10-CM | POA: Insufficient documentation

## 2021-05-26 LAB — SARS CORONAVIRUS 2 (TAT 6-24 HRS): SARS Coronavirus 2: NEGATIVE

## 2021-05-29 ENCOUNTER — Other Ambulatory Visit: Payer: Self-pay

## 2021-05-29 ENCOUNTER — Encounter (HOSPITAL_COMMUNITY): Payer: Self-pay | Admitting: Internal Medicine

## 2021-05-29 ENCOUNTER — Inpatient Hospital Stay (HOSPITAL_COMMUNITY)
Admission: AD | Admit: 2021-05-29 | Discharge: 2021-06-02 | DRG: 847 | Disposition: A | Discharge status: Home Health | Payer: Medicare HMO | Attending: Internal Medicine | Admitting: Internal Medicine

## 2021-05-29 DIAGNOSIS — C8589 Other specified types of non-Hodgkin lymphoma, extranodal and solid organ sites: Secondary | ICD-10-CM | POA: Diagnosis not present

## 2021-05-29 DIAGNOSIS — C8339 Diffuse large B-cell lymphoma, extranodal and solid organ sites: Secondary | ICD-10-CM | POA: Diagnosis present

## 2021-05-29 DIAGNOSIS — Z79899 Other long term (current) drug therapy: Secondary | ICD-10-CM | POA: Diagnosis not present

## 2021-05-29 DIAGNOSIS — Z20822 Contact with and (suspected) exposure to covid-19: Secondary | ICD-10-CM | POA: Diagnosis present

## 2021-05-29 DIAGNOSIS — F0390 Unspecified dementia without behavioral disturbance: Secondary | ICD-10-CM | POA: Diagnosis present

## 2021-05-29 DIAGNOSIS — Z5111 Encounter for antineoplastic chemotherapy: Secondary | ICD-10-CM | POA: Diagnosis not present

## 2021-05-29 LAB — COMPREHENSIVE METABOLIC PANEL
ALT: 43 U/L (ref 0–44)
AST: 27 U/L (ref 15–41)
Albumin: 3.5 g/dL (ref 3.5–5.0)
Alkaline Phosphatase: 27 U/L — ABNORMAL LOW (ref 38–126)
Anion gap: 7 (ref 5–15)
BUN: 17 mg/dL (ref 8–23)
CO2: 28 mmol/L (ref 22–32)
Calcium: 8.9 mg/dL (ref 8.9–10.3)
Chloride: 98 mmol/L (ref 98–111)
Creatinine, Ser: 0.57 mg/dL (ref 0.44–1.00)
GFR, Estimated: >60 mL/min (ref >60)
Glucose, Bld: 84 mg/dL (ref 70–99)
Potassium: 4.1 mmol/L (ref 3.5–5.1)
Sodium: 133 mmol/L — ABNORMAL LOW (ref 135–145)
Total Bilirubin: 0.7 mg/dL (ref 0.3–1.2)
Total Protein: 6.3 g/dL — ABNORMAL LOW (ref 6.5–8.1)

## 2021-05-29 LAB — CBC
HCT: 42.6 % (ref 36.0–46.0)
Hemoglobin: 13.7 g/dL (ref 12.0–15.0)
MCH: 28.7 pg (ref 26.0–34.0)
MCHC: 32.2 g/dL (ref 30.0–36.0)
MCV: 89.1 fL (ref 80.0–100.0)
Platelets: 213 10*3/uL (ref 150–400)
RBC: 4.78 MIL/uL (ref 3.87–5.11)
RDW: 14.1 % (ref 11.5–15.5)
WBC: 8.2 10*3/uL (ref 4.0–10.5)
nRBC: 0 % (ref 0.0–0.2)

## 2021-05-29 MED ORDER — GUAIFENESIN-DM 100-10 MG/5ML PO SYRP: 10.0000 mL | ORAL_SOLUTION | ORAL | Status: DC | PRN

## 2021-05-29 MED ORDER — LEVETIRACETAM 500 MG PO TABS
500.0000 mg | ORAL_TABLET | Freq: Two times a day (BID) | ORAL | Status: DC
  Administered 2021-05-29 – 2021-06-02 (×8): 500 mg via ORAL
  Filled 2021-05-29 (×8): qty 1

## 2021-05-29 MED ORDER — STERILE WATER FOR INJECTION IV SOLN
INTRAVENOUS | Status: DC
  Filled 2021-05-29 (×10): qty 900

## 2021-05-29 MED ORDER — DIPHENHYDRAMINE HCL 50 MG PO CAPS: 50.0000 mg | ORAL_CAPSULE | Freq: Every evening | ORAL | Status: DC | PRN

## 2021-05-29 MED ORDER — POLYETHYLENE GLYCOL 3350 17 G PO PACK
17.0000 g | PACK | Freq: Every day | ORAL | Status: DC
  Administered 2021-05-30 – 2021-06-02 (×3): 17 g via ORAL
  Filled 2021-05-29 (×4): qty 1

## 2021-05-29 MED ORDER — ONDANSETRON HCL 4 MG PO TABS: 4.0000 mg | ORAL_TABLET | Freq: Three times a day (TID) | ORAL | Status: DC | PRN

## 2021-05-29 MED ORDER — DEXAMETHASONE 4 MG PO TABS
4.0000 mg | ORAL_TABLET | Freq: Every day | ORAL | Status: DC
  Administered 2021-05-30 – 2021-06-02 (×4): 4 mg via ORAL
  Filled 2021-05-29 (×4): qty 1

## 2021-05-29 MED ORDER — SODIUM CHLORIDE 0.45 % IV SOLN: INTRAVENOUS | Status: DC

## 2021-05-29 MED ORDER — ENOXAPARIN SODIUM 40 MG/0.4ML IJ SOSY
40.0000 mg | PREFILLED_SYRINGE | INTRAMUSCULAR | Status: DC
Start: 2021-05-29 — End: 2021-06-02
  Administered 2021-05-29 – 2021-06-01 (×4): 40 mg via SUBCUTANEOUS
  Filled 2021-05-29 (×4): qty 0.4

## 2021-05-29 MED ORDER — VITAMIN B-12 1000 MCG PO TABS
1000.0000 ug | ORAL_TABLET | Freq: Every day | ORAL | Status: DC
  Administered 2021-05-30 – 2021-06-02 (×4): 1000 ug via ORAL
  Filled 2021-05-29 (×4): qty 1

## 2021-05-29 MED ORDER — SODIUM CHLORIDE 0.9 % IV SOLN
8.0000 mg | Freq: Three times a day (TID) | INTRAVENOUS | Status: DC | PRN
  Filled 2021-05-29: qty 4

## 2021-05-29 MED ORDER — HYDROCORTISONE (PERIANAL) 2.5 % EX CREA
1.0000 | TOPICAL_CREAM | Freq: Two times a day (BID) | CUTANEOUS | Status: DC | PRN
Start: 2021-05-29 — End: 2021-06-02

## 2021-05-29 MED ORDER — SENNOSIDES-DOCUSATE SODIUM 8.6-50 MG PO TABS
1.0000 | ORAL_TABLET | Freq: Every evening | ORAL | Status: DC | PRN
Start: 2021-05-29 — End: 2021-06-02

## 2021-05-29 MED ORDER — ONDANSETRON HCL 4 MG/2ML IJ SOLN: 4.0000 mg | Freq: Three times a day (TID) | INTRAMUSCULAR | Status: DC | PRN

## 2021-05-29 MED ORDER — ALUM & MAG HYDROXIDE-SIMETH 200-200-20 MG/5ML PO SUSP: 60.0000 mL | ORAL | Status: DC | PRN

## 2021-05-29 MED ORDER — ACETAMINOPHEN 325 MG PO TABS
650.0000 mg | ORAL_TABLET | ORAL | Status: DC | PRN
Start: 2021-05-29 — End: 2021-06-02

## 2021-05-29 MED ORDER — ONDANSETRON 4 MG PO TBDP
4.0000 mg | ORAL_TABLET | Freq: Three times a day (TID) | ORAL | Status: DC | PRN
  Filled 2021-05-29: qty 2

## 2021-05-29 NOTE — TOC Initial Note (Signed)
Transition of Care [facility]) - Initial/Assessment Note    Patient Details  Name: Audrey Barron MRN: [id number] Date of Birth: 04/26/1951  [facility] CM/SW Contact:    Consetta Cosner, Marjie Skiff, RN Phone Number: 05/29/2021, 12:39 PM  Clinical Narrative:                 Was alerted by [facility] liaison Ramond Marrow that pt is active with them at home for HHPT/OT/RN/Aide. She will need new [place] orders at dc to continue services.  Expected Discharge Plan: [place] Barriers to Discharge: Continued Medical Work up      Expected Discharge Plan and Services Expected Discharge Plan: [place]   Discharge Planning Services: CM Consult   Living arrangements for the past 2 months: [place]: [place] ([place]) Date HH Agency Contacted: 05/29/21 Time [place]: 61 Representative spoke with at [place]: [place] Arrangements/Services Living arrangements for the past 2 months: [place] with:: Adult Children              Current home services: Home OT,Home PT,Homehealth aide,Home RN    Activities of Daily Living Home Assistive Devices/Equipment: Environmental consultant (specify type),Eyeglasses ADL Screening (condition at time of admission) Patient's cognitive ability adequate to safely complete daily activities?: Yes Is the patient deaf or have difficulty hearing?: No Does the patient have difficulty seeing, even when wearing glasses/contacts?: No Does the patient have difficulty concentrating, remembering, or making decisions?: Yes Patient able to express need for assistance with ADLs?: Yes Does the patient have difficulty dressing or bathing?: No Independently performs ADLs?: Yes (appropriate for developmental age) Does the patient have difficulty walking or climbing stairs?: No Weakness of Legs: None Weakness of Arms/Hands: None   Admission  diagnosis:  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 Delirium due to another medical condition 05/01/2021  . Hyponatremia   . Primary CNS lymphoma ([place]) 04/27/2021   PCP:  Marguerita Merles, MD Pharmacy:   [place], [place] - [address] [place] [address] Phone: [phone] Fax: [phone]     Social Determinants of Health (SDOH) Interventions    Readmission Risk Interventions No flowsheet data found.

## 2021-05-29 NOTE — H&P (Signed)
[place] at [place], [place] [address] [phone]   Interval Evaluation  Date of Service: 05/19/21 Patient Name: Audrey Barron Patient MRN: [id number] Patient DOB: [birth year] Provider: Ventura Sellers, MD  Identifying Statement:  Audrey Barron is a 70 y.o. female with left hemispheric Primary CNS Lymphoma   Oncologic History: Oncology History  Primary CNS lymphoma ([place])  04/28/2021 Surgery   Open biopsy with Dr. Izora Ribas; path demonstrates CNS B-cell lymphoma, CD20+   05/29/2021 -  Chemotherapy    Patient is on Treatment Plan: IP NON-HODGKINS LYMPHOMA HIGH DOSE METHOTREXATE + LEUCOVORIN RESCUE      05/30/2021 -  Chemotherapy    Patient is on Treatment Plan: IP NON-HODGKINS LYMPHOMA HIGH DOSE METHOTREXATE + LEUCOVORIN RESCUE   Patient is on Antibody Plan: NON-HODGKINS LYMPHOMA RITUXIMAB Q21D      Biomarkers:  CD20 positive.  Bcl-6 positive  Ki-67 95%      Interval History:  Audrey Barron presents today for initiation of induction therapy with methotrexate and rituximab. No new complaints prior to admission. Denies seizures, headaches.  No new deficits are described.  No difficulty with any of the staging workup, including port placement.  H+P (05/05/21) Patient presented to medical attention in April, with 2-3 months history of confusion, impaired speech, balance and gait impairment.  Neurologist ordered an MRI, which demonstrated infiltrative tumor within much of left hemisphere.  She underwent craniotomy and open biopsy on 04/28/21 with Dr. Izora Ribas at [facility], which yielded a B-cell CNS Lymphoma.  Following surgery, she has continued to experience difficulty speaking, weakness on the right side, and poor balance.  She is unable to walk without assistance at home.  At present she is reliant on her daughter for help with dressing, cooking, tolieting; a majority of activities of daily living.  She has been  dosing decadron 72m three times per day; this has not clearly led to any improvements in functional status.   Medications: No current facility-administered medications on file prior to encounter.   Current Outpatient Medications on File Prior to Encounter  Medication Sig Dispense Refill  . cyanocobalamin 1000 MCG tablet Take 1,000 mcg by mouth daily.    .Marland Kitchendexamethasone (DECADRON) 4 MG tablet Take 1 tablet (4 mg total) by mouth daily. 30 tablet 3  . diphenhydrAMINE (BENADRYL) 50 MG capsule Take 1 capsule (50 mg total) by mouth at bedtime as needed for sleep. 30 capsule 0  . levETIRAcetam (KEPPRA) 500 MG tablet Take 1 tablet (500 mg total) by mouth 2 (two) times daily. 60 tablet 0  . polyethylene glycol (MIRALAX / GLYCOLAX) 17 g packet Take 17 g by mouth daily. 14 each 0    Allergies: No Known Allergies Past Medical History:  Past Medical History:  Diagnosis Date  . [place]    lymphoma  . Dementia ([facility]    Past Surgical History:  Past Surgical History:  Procedure Laterality Date  . APPLICATION OF CRANIAL NAVIGATION N/A 04/28/2021   Procedure: APPLICATION OF CRANIAL NAVIGATION;  Surgeon: YMeade Maw MD;  Location: [facility] ORS;  Service: Neurosurgery;  Laterality: N/A;  . CRANIOTOMY Left 04/28/2021   Procedure: CRANIOTOMY TUMOR EXCISION;  Surgeon: YMeade Maw MD;  Location: [facility] ORS;  Service: Neurosurgery;  Laterality: Left;  . IR IMAGING GUIDED PORT INSERTION  05/12/2021   Social History:  Social History   Socioeconomic History  . Marital status: Single    Spouse name: Not on file  . Number of children:  Not on file  . Years of education: Not on file  . Highest education level: Not on file  Occupational History  . Not on file  Tobacco Use  . Smoking status: Never Smoker  . Smokeless tobacco: Never Used  Substance and Sexual Activity  . Alcohol use: Not Currently  . Drug use: Not Currently  . Sexual activity: Not on file  Other Topics Concern  . Not on file   Social History Narrative  . Not on file   Social Determinants of Health   Financial Resource Strain: Not on file  Food Insecurity: Not on file  Transportation Needs: Not on file  Physical Activity: Not on file  Stress: Not on file  Social Connections: Not on file  Intimate Partner Violence: Not on file   Family History: History reviewed. No pertinent family history.  Review of Systems: Constitutional: Doesn't report fevers, chills or abnormal weight loss Eyes: Doesn't report blurriness of vision Ears, nose, mouth, throat, and face: Doesn't report sore throat Respiratory: Doesn't report cough, dyspnea or wheezes Cardiovascular: Doesn't report palpitation, chest discomfort  Gastrointestinal: +constipation GU: Doesn't report incontinence Skin: Doesn't report skin rashes Neurological: Per HPI Musculoskeletal: Doesn't report joint pain Behavioral/Psych: Doesn't report anxiety  Physical Exam: Vitals:   05/29/21 1040  BP: 123/90  Pulse: 76  Resp: 16  Temp: 98.3 F (36.8 C)  SpO2: 98%   KPS: 60. General: Alert, cooperative, pleasant, in no acute distress Head: Normal EENT: No conjunctival injection or scleral icterus.  Lungs: Resp effort normal Cardiac: Regular rate Abdomen: Non-distended abdomen Skin: No rashes cyanosis or petechiae. Extremities: No clubbing or edema  Neurologic Exam: Mental Status: Awake, alert, attentive to examiner. Oriented to self and environment. Language is mildly impaired with regards to both fluency and comprehension.  Further mental status evaluation limited by dysphasia. Cranial Nerves: Visual acuity is grossly normal. Visual fields are full. Extra-ocular movements intact. No ptosis. Face is symmetric Motor: Tone and bulk are normal. Power is 4+/5 in right arm and leg. Reflexes are symmetric, no pathologic reflexes present.  Sensory: Intact to light touch Gait: Dystaxic   Labs: I have reviewed the data as listed    Component Value  Date/Time   NA 139 05/01/2021 1657   K 4.0 05/01/2021 1657   CL 109 05/01/2021 1657   CO2 24 05/01/2021 1657   GLUCOSE 116 (H) 05/01/2021 1657   BUN 26 (H) 05/01/2021 1657   CREATININE 0.68 05/01/2021 1657   CALCIUM 9.1 05/01/2021 1657   PROT 6.9 04/28/2021 2245   ALBUMIN 3.8 04/28/2021 2245   AST 19 04/28/2021 2245   ALT 18 04/28/2021 2245   ALKPHOS 25 (L) 04/28/2021 2245   BILITOT 1.2 04/28/2021 2245   GFRNONAA >60 05/01/2021 1657   Lab Results  Component Value Date   WBC 10.2 05/01/2021   HGB 13.4 05/01/2021   HCT 40.0 05/01/2021   MCV 87.1 05/01/2021   PLT 197 05/01/2021    Imaging:  NM PET Image Initial (PI) Skull Base To Thigh  Result Date: 05/16/2021 CLINICAL DATA:  Initial treatment strategy for CNS lymphoma. EXAM: NUCLEAR MEDICINE PET SKULL BASE TO THIGH TECHNIQUE: 6.9 mCi F-18 FDG was injected intravenously. Full-ring PET imaging was performed from the skull base to thigh after the radiotracer. CT data was obtained and used for attenuation correction and anatomic localization. Fasting blood glucose: 91 mg/dl COMPARISON:  Head CT 04/28/2021. Chest abdomen pelvis CT 04/27/2021. FINDINGS: Mediastinal blood pool activity: SUV max 2.0 Liver activity: SUV  max NA NECK: Images which included the lower brain show asymmetric hypermetabolism in the midline and left hemisphere. No hypermetabolic lymphadenopathy in the neck. Incidental CT findings: none CHEST: No hypermetabolic mediastinal or hilar nodes. No suspicious pulmonary nodules on the CT scan. Incidental CT findings: Right Port-A-Cath tip is positioned in the distal SVC. Basilar atelectasis noted bilaterally. ABDOMEN/PELVIS: No abnormal hypermetabolic activity within the liver, pancreas, adrenal glands, or spleen. No hypermetabolic lymph nodes in the abdomen or pelvis. Incidental CT findings: Small scattered cysts noted. There is abdominal aortic atherosclerosis without aneurysm. SKELETON: No focal hypermetabolic activity to  suggest skeletal metastasis. Incidental CT findings: none IMPRESSION: 1. No unexpected or suspicious hypermetabolism in the neck, chest, abdomen, or pelvis. Electronically Signed   By: Misty Stanley M.D.   On: 05/16/2021 11:41   MR TOTAL SPINE METS SCREENING  Result Date: 05/17/2021 CLINICAL DATA:  CNS lymphoma, staging EXAM: MRI TOTAL SPINE WITHOUT AND WITH CONTRAST TECHNIQUE: Multisequence MR imaging of the spine from the cervical spine to the sacrum was performed prior to and following IV contrast administration for evaluation of spinal metastatic disease. CONTRAST:  6m GADAVIST GADOBUTROL 1 MMOL/ML IV SOLN COMPARISON:  None. FINDINGS: There is mild degenerative listhesis at cervical and lumbar levels. Vertebral body heights are maintained apart from degenerative endplate irregularity primarily at cervical and lower lumbar levels. There is no marrow edema. No suspicious osseous lesion. No abnormal cord signal on the large field-of-view images. No evidence of abnormal leptomeningeal enhancement in the spine. Partially imaged intracranial enhancing lesion. Multilevel degenerative changes are present at cervical and lumbar levels without high-grade canal narrowing. Cervical foramina are difficult to evaluate. There is lumbar neural foraminal narrowing at L4-L5 and L5-S1. IMPRESSION: No evidence of metastatic disease. Electronically Signed   By: PMacy MisM.D.   On: 05/17/2021 14:44   IR IMAGING GUIDED PORT INSERTION  Result Date: 05/12/2021 INDICATION: 70year-old female with history of intracranial lymphoma requiring central venous access for chemotherapy. EXAM: IMPLANTED PORT A CATH PLACEMENT WITH ULTRASOUND AND FLUOROSCOPIC GUIDANCE COMPARISON:  None. MEDICATIONS: None. ANESTHESIA/SEDATION: Moderate (conscious) sedation was employed during this procedure. A total of Versed 2 mg and Fentanyl 100 mcg was administered intravenously. Moderate Sedation Time: 18 minutes. The patient's level of  consciousness and vital signs were monitored continuously by radiology nursing throughout the procedure under my direct supervision. CONTRAST:  None FLUOROSCOPY TIME:  0.1 minutes, (03.55mGy) COMPLICATIONS: None immediate. PROCEDURE: The procedure, risks, benefits, and alternatives were explained to the patient. Questions regarding the procedure were encouraged and answered. The patient understands and consents to the procedure. The right neck and chest were prepped with chlorhexidine in a sterile fashion, and a sterile drape was applied covering the operative field. Maximum barrier sterile technique with sterile gowns and gloves were used for the procedure. A timeout was performed prior to the initiation of the procedure. Ultrasound was used to examine the jugular vein which was compressible and free of internal echoes. A skin marker was used to demarcate the planned venotomy and port pocket incision sites. Local anesthesia was provided to these sites and the subcutaneous tunnel track with 1% lidocaine with 1:100,000 epinephrine. A small incision was created at the jugular access site and blunt dissection was performed of the subcutaneous tissues. Under ultrasound guidance, the jugular vein was accessed with a 21 ga micropuncture needle and an 0.018" wire was inserted to the superior vena cava. Real-time ultrasound guidance was utilized for vascular access including the acquisition of a permanent ultrasound  image documenting patency of the accessed vessel. A 5 Fr micopuncture set was then used, through which a 0.035" Rosen wire was passed under fluoroscopic guidance into the inferior vena cava. An 8 Fr dilator was then placed over the wire. A subcutaneous port pocket was then created along the upper chest wall utilizing a combination of sharp and blunt dissection. The pocket was irrigated with sterile saline, packed with gauze, and observed for hemorrhage. A single lumen "ISP" sized power injectable port was chosen  for placement. The 8 Fr catheter was tunneled from the port pocket site to the venotomy incision. The port was placed in the pocket. The external catheter was trimmed to appropriate length. The dilator was exchanged for an 8 Fr peel-away sheath under fluoroscopic guidance. The catheter was then placed through the sheath and the sheath was removed. Final catheter positioning was confirmed and documented with a fluoroscopic spot radiograph. The port was accessed with a Huber needle, aspirated, and flushed with heparinized saline. The deep dermal layer of the port pocket incision was closed with interrupted 3-0 Vicryl suture. Dermabond was then placed over the port pocket and neck incisions. The patient tolerated the procedure well without immediate post procedural complication. FINDINGS: After catheter placement, the tip lies within the superior cavoatrial junction. The catheter aspirates and flushes normally and is ready for immediate use. IMPRESSION: Successful placement of a power injectable Port-A-Cath via the right internal jugular vein. The catheter is ready for immediate use. Ruthann Cancer, MD [facility] Radiology Electronically Signed   By: Ruthann Cancer MD   On: 05/12/2021 09:19    Pathology: SURGICAL PATHOLOGY  CASE: [phone]  PATIENT: Junita Push  Surgical Pathology Report   Specimen Submitted:  A. Temporal lesion, left   Clinical History: Left brain mass   DIAGNOSIS:  A. BRAIN, LEFT TEMPORAL LOBE; CRANIOTOMY WITH BIOPSY:  - HIGH GRADE B-CELL LYMPHOMA, COMPATIBLE WITH PRIMARY CNS LYMPHOMA.   Comment:  Biopsy sections display neural tissue predominantly replaced by an  abnormal proliferation of intermediate to large lymphocytes. These  lymphocytes display vesicular chromatin, prominent nucleoli, high N:C  ratio, and abnormal nuclear contours. Apoptotic debris is abundant. In  addition, there are patchy areas of tumor  necrosis.   Immunohistochemical studies show this abnormal proliferation to be  comprised of a large CD20+ B cells, with relatively few CD3+ T cells.  Abnormal B cells are positive for BCL-6 and MUM-1, and negative for  BCL-2. CD10 shows diffuse dim blush like staining, the significance of  which is unclear. Cyclin-D1 and CD30 are negative. c-myc is positive,  marking 50-60% of abnormal B cells. Ki-67 is significantly elevated,  with positivity in greater than 95% of abnormal B-cells. An  Epstein-Barr virus encoded RNA (EBER) ISH probe is negative.   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 Although CD10 positivity within this entity would  be rare, the absence of radiographic evidence of peripheral adenopathy  or other potential sites of involvement would favor a primary CNS  lymphoma. There is sufficient tissue present for ancillary molecular  testing if desired.    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  She again presents with clinical, radiographic, histologic syndrome consistent with Primary B-cell CNS Lymphoma, with no evidence of leptomeningeal or ocular spread.  -once urine alkalinized, appropriate to proceed with  C1of high dose MTX with leucovorin rescue inpatient -MTX-Rtherapy C1D1 likely tomorrow 05/30/21: Methotrexate 3.5g/m2given over 4 hoursonce urine pH >= 7.0 -Leucovorin79m q6 hours 24 hours after MTX infusion -Rituxancan be given 24-48 hours after MTX infusion -To start ASAP: IV fluids, 1/4NS with 1016m sodium bicarbonate at 12537mr. Need toadjust rate to urine pH >= 7 -daily labs with cbc, cmp -daily urine pH once it reaches target pH of >7 -daily MTX levelsuntilMTX levels <0.10 -continue same supportive medicationswith exception of  protonix which interferes with clearance of MTX  -continue decadron 4mg77mily -LFTs and renal function both normal at this time   HCV serum screening antibody test and HCV RNA were positive.  Rituximab component of treatment regimen may need to be altered if viral load increases or patient develops elevated liver enzymes.   Impact of chronic HCV infection, if demonstrated, may not affect mortality but greatly increase risk of treatment related hepatotoxicity: Daisuke Ennishi, et al. Hepatic toxicity and prognosis in hepatitis C virus-infected patients with diffuse large B-cell lymphoma treated with rituximab-containing chemotherapy regimens: a [facility] analysis. Blood 2010; 116 (24): [id number] questions were answered. The patient knows to call the clinic with any problems, questions or concerns. No barriers to learning were detected.  The total time spent in the encounter was 60 minutes and more than 50% was on counseling and review of test results   ZachVentura Sellers Medical Director of Neuro-Oncology [facility]27/22 1:12 PM

## 2021-05-30 LAB — COMPREHENSIVE METABOLIC PANEL
ALT: 39 U/L (ref 0–44)
AST: 23 U/L (ref 15–41)
Albumin: 3.2 g/dL — ABNORMAL LOW (ref 3.5–5.0)
Alkaline Phosphatase: 27 U/L — ABNORMAL LOW (ref 38–126)
Anion gap: 8 (ref 5–15)
BUN: 15 mg/dL (ref 8–23)
CO2: 31 mmol/L (ref 22–32)
Calcium: 8.8 mg/dL — ABNORMAL LOW (ref 8.9–10.3)
Chloride: 91 mmol/L — ABNORMAL LOW (ref 98–111)
Creatinine, Ser: 0.41 mg/dL — ABNORMAL LOW (ref 0.44–1.00)
GFR, Estimated: >60 mL/min (ref >60)
Glucose, Bld: 75 mg/dL (ref 70–99)
Potassium: 3.8 mmol/L (ref 3.5–5.1)
Sodium: 130 mmol/L — ABNORMAL LOW (ref 135–145)
Total Bilirubin: 1.1 mg/dL (ref 0.3–1.2)
Total Protein: 5.9 g/dL — ABNORMAL LOW (ref 6.5–8.1)

## 2021-05-30 LAB — URINALYSIS, ROUTINE W REFLEX MICROSCOPIC
Bilirubin Urine: NEGATIVE
Glucose, UA: NEGATIVE mg/dL
Hgb urine dipstick: NEGATIVE
Ketones, ur: NEGATIVE mg/dL
Leukocytes,Ua: NEGATIVE
Nitrite: NEGATIVE
Protein, ur: NEGATIVE mg/dL
Specific Gravity, Urine: 1.011 (ref 1.005–1.030)
pH: 9 — ABNORMAL HIGH (ref 5.0–8.0)

## 2021-05-30 LAB — CBC
HCT: 40.2 % (ref 36.0–46.0)
Hemoglobin: 13.3 g/dL (ref 12.0–15.0)
MCH: 29.2 pg (ref 26.0–34.0)
MCHC: 33.1 g/dL (ref 30.0–36.0)
MCV: 88.2 fL (ref 80.0–100.0)
Platelets: 199 10*3/uL (ref 150–400)
RBC: 4.56 MIL/uL (ref 3.87–5.11)
RDW: 13.9 % (ref 11.5–15.5)
WBC: 7.4 10*3/uL (ref 4.0–10.5)
nRBC: 0 % (ref 0.0–0.2)

## 2021-05-30 MED ORDER — SODIUM CHLORIDE 0.9 % IV SOLN
INTRAVENOUS | Status: DC
Start: 2021-05-30 — End: 2021-06-02

## 2021-05-30 MED ORDER — SODIUM CHLORIDE 0.9 % IV SOLN
3.5000 g/m2 | Freq: Once | INTRAVENOUS | Status: AC
  Administered 2021-05-30 (×2): 5.565 g via INTRAVENOUS
  Filled 2021-05-30: qty 222.6

## 2021-05-30 MED ORDER — CHLORHEXIDINE GLUCONATE CLOTH 2 % EX PADS
6.0000 | MEDICATED_PAD | Freq: Every day | CUTANEOUS | Status: DC
  Administered 2021-05-30 – 2021-06-01 (×3): 6 via TOPICAL

## 2021-05-30 MED ORDER — ONDANSETRON HCL 4 MG/2ML IJ SOLN
Freq: Once | INTRAMUSCULAR | Status: AC
  Administered 2021-05-30: 16 mg via INTRAVENOUS
  Filled 2021-05-30: qty 8

## 2021-05-30 NOTE — Progress Notes (Signed)
[place] [address] [address], [place] [address] [phone]   Interval Evaluation  Date of Service: 05/19/21 Patient Name: Audrey Barron Patient MRN: [id number] Patient DOB: 03-05-1951 Provider: Ventura Sellers, MD  Identifying Statement:  Audrey Barron is a 70 y.o. female with left hemispheric Primary CNS Lymphoma   Oncologic History: Oncology History  Primary CNS lymphoma ([place])  04/28/2021 Surgery   Open biopsy with Dr. Izora Ribas; path demonstrates CNS B-cell lymphoma, CD20+   05/30/2021 -  Chemotherapy    Patient is on Treatment Plan: IP NON-HODGKINS LYMPHOMA HIGH DOSE METHOTREXATE + LEUCOVORIN RESCUE   Patient is on Antibody Plan: NON-HODGKINS LYMPHOMA RITUXIMAB Q21D    05/30/2021 -  Chemotherapy    Patient is on Treatment Plan: IP NON-HODGKINS LYMPHOMA HIGH DOSE METHOTREXATE + LEUCOVORIN RESCUE   Patient is on Antibody Plan: NON-HODGKINS LYMPHOMA RITUXIMAB Q21D      Biomarkers:  CD20 positive.  Bcl-6 positive  Ki-67 95%      Interval History:  Audrey Barron is resting comfortably today, having received methotrexate infusion.  She denies any new complaints today, tolerated treatment well.  (05/29/21) Patient presents today for initiation of induction therapy with methotrexate and rituximab. No new complaints prior to admission. Denies seizures, headaches.  No new deficits are described.  No difficulty with any of the staging workup, including port placement.  H+P (05/05/21) Patient presented to medical attention in April, with 2-3 months history of confusion, impaired speech, balance and gait impairment.  Neurologist ordered an MRI, which demonstrated infiltrative tumor within much of left hemisphere.  She underwent craniotomy and open biopsy on 04/28/21 with Dr. Izora Ribas at [facility], which yielded a B-cell CNS Lymphoma.  Following surgery, she has continued to experience difficulty speaking, weakness on the right  side, and poor balance.  She is unable to walk without assistance at home.  At present she is reliant on her daughter for help with dressing, cooking, tolieting; a majority of activities of daily living.  She has been dosing decadron 27m three times per day; this has not clearly led to any improvements in functional status.   Medications: No current facility-administered medications on file prior to encounter.   Current Outpatient Medications on File Prior to Encounter  Medication Sig Dispense Refill  . acetaminophen (TYLENOL) 500 MG tablet Take 500 mg by mouth at bedtime.    . cyanocobalamin 1000 MCG tablet Take 1,000 mcg by mouth daily.    .Marland Kitchendexamethasone (DECADRON) 4 MG tablet Take 1 tablet (4 mg total) by mouth daily. 30 tablet 3  . diphenhydrAMINE (BENADRYL) 50 MG capsule Take 1 capsule (50 mg total) by mouth at bedtime as needed for sleep. 30 capsule 0  . levETIRAcetam (KEPPRA) 500 MG tablet Take 1 tablet (500 mg total) by mouth 2 (two) times daily. (Patient not taking: Reported on 05/29/2021) 60 tablet 0  . polyethylene glycol (MIRALAX / GLYCOLAX) 17 g packet Take 17 g by mouth daily. (Patient not taking: Reported on 05/29/2021) 14 each 0    Allergies: No Known Allergies Past Medical History:  Past Medical History:  Diagnosis Date  . [place]    lymphoma  . Dementia ([facility]    Past Surgical History:  Past Surgical History:  Procedure Laterality Date  . APPLICATION OF CRANIAL NAVIGATION N/A 04/28/2021   Procedure: APPLICATION OF CRANIAL NAVIGATION;  Surgeon: YMeade Maw MD;  Location: [facility] ORS;  Service: Neurosurgery;  Laterality: N/A;  . CRANIOTOMY Left 04/28/2021  Procedure: CRANIOTOMY TUMOR EXCISION;  Surgeon: Meade Maw, MD;  Location: [facility] ORS;  Service: Neurosurgery;  Laterality: Left;  . IR IMAGING GUIDED PORT INSERTION  05/12/2021   Social History:  Social History   Socioeconomic History  . Marital status: Single    Spouse name: Not on file  . Number of  children: Not on file  . Years of education: Not on file  . Highest education level: Not on file  Occupational History  . Not on file  Tobacco Use  . Smoking status: Never Smoker  . Smokeless tobacco: Never Used  Substance and Sexual Activity  . Alcohol use: Not Currently  . Drug use: Not Currently  . Sexual activity: Not on file  Other Topics Concern  . Not on file  Social History Narrative  . Not on file   Social Determinants of Health   Financial Resource Strain: Not on file  Food Insecurity: Not on file  Transportation Needs: Not on file  Physical Activity: Not on file  Stress: Not on file  Social Connections: Not on file  Intimate Partner Violence: Not on file   Family History: History reviewed. No pertinent family history.  Review of Systems: Constitutional: Doesn't report fevers, chills or abnormal weight loss Eyes: Doesn't report blurriness of vision Ears, nose, mouth, throat, and face: Doesn't report sore throat Respiratory: Doesn't report cough, dyspnea or wheezes Cardiovascular: Doesn't report palpitation, chest discomfort  Gastrointestinal: +constipation GU: Doesn't report incontinence Skin: Doesn't report skin rashes Neurological: Per HPI Musculoskeletal: Doesn't report joint pain Behavioral/Psych: Doesn't report anxiety  Physical Exam: Vitals:   05/30/21 1308 05/30/21 1453  BP: (!) 143/101 (!) 147/89  Pulse: 71 68  Resp: 18 18  Temp: 98.3 F (36.8 C)   SpO2: 96% 99%   KPS: 60. General: Alert, cooperative, pleasant, in no acute distress Head: Normal EENT: No conjunctival injection or scleral icterus.  Lungs: Resp effort normal Cardiac: Regular rate Abdomen: Non-distended abdomen Skin: No rashes cyanosis or petechiae. Extremities: No clubbing or edema  Neurologic Exam: Mental Status: Awake, alert, attentive to examiner. Oriented to self and environment. Language is mildly impaired with regards to both fluency and comprehension.  Further  mental status evaluation limited by dysphasia. Cranial Nerves: Visual acuity is grossly normal. Visual fields are full. Extra-ocular movements intact. No ptosis. Face is symmetric Motor: Tone and bulk are normal. Power is 4+/5 in right arm and leg. Reflexes are symmetric, no pathologic reflexes present.  Sensory: Intact to light touch Gait: Dystaxic   Labs: I have reviewed the data as listed    Component Value Date/Time   NA 130 (L) 05/30/2021 0544   K 3.8 05/30/2021 0544   CL 91 (L) 05/30/2021 0544   CO2 31 05/30/2021 0544   GLUCOSE 75 05/30/2021 0544   BUN 15 05/30/2021 0544   CREATININE 0.41 (L) 05/30/2021 0544   CALCIUM 8.8 (L) 05/30/2021 0544   PROT 5.9 (L) 05/30/2021 0544   ALBUMIN 3.2 (L) 05/30/2021 0544   AST 23 05/30/2021 0544   ALT 39 05/30/2021 0544   ALKPHOS 27 (L) 05/30/2021 0544   BILITOT 1.1 05/30/2021 0544   GFRNONAA >60 05/30/2021 0544   Lab Results  Component Value Date   WBC 7.4 05/30/2021   HGB 13.3 05/30/2021   HCT 40.2 05/30/2021   MCV 88.2 05/30/2021   PLT 199 05/30/2021    Imaging:  NM PET Image Initial (PI) Skull Base To Thigh  Result Date: 05/16/2021 CLINICAL DATA:  Initial treatment strategy for  CNS lymphoma. EXAM: NUCLEAR MEDICINE PET SKULL BASE TO THIGH TECHNIQUE: 6.9 mCi F-18 FDG was injected intravenously. Full-ring PET imaging was performed from the skull base to thigh after the radiotracer. CT data was obtained and used for attenuation correction and anatomic localization. Fasting blood glucose: 91 mg/dl COMPARISON:  Head CT 04/28/2021. Chest abdomen pelvis CT 04/27/2021. FINDINGS: Mediastinal blood pool activity: SUV max 2.0 Liver activity: SUV max NA NECK: Images which included the lower brain show asymmetric hypermetabolism in the midline and left hemisphere. No hypermetabolic lymphadenopathy in the neck. Incidental CT findings: none CHEST: No hypermetabolic mediastinal or hilar nodes. No suspicious pulmonary nodules on the CT scan.  Incidental CT findings: Right Port-A-Cath tip is positioned in the distal SVC. Basilar atelectasis noted bilaterally. ABDOMEN/PELVIS: No abnormal hypermetabolic activity within the liver, pancreas, adrenal glands, or spleen. No hypermetabolic lymph nodes in the abdomen or pelvis. Incidental CT findings: Small scattered cysts noted. There is abdominal aortic atherosclerosis without aneurysm. SKELETON: No focal hypermetabolic activity to suggest skeletal metastasis. Incidental CT findings: none IMPRESSION: 1. No unexpected or suspicious hypermetabolism in the neck, chest, abdomen, or pelvis. Electronically Signed   By: Misty Stanley M.D.   On: 05/16/2021 11:41   MR TOTAL SPINE METS SCREENING  Result Date: 05/17/2021 CLINICAL DATA:  CNS lymphoma, staging EXAM: MRI TOTAL SPINE WITHOUT AND WITH CONTRAST TECHNIQUE: Multisequence MR imaging of the spine from the cervical spine to the sacrum was performed prior to and following IV contrast administration for evaluation of spinal metastatic disease. CONTRAST:  19m GADAVIST GADOBUTROL 1 MMOL/ML IV SOLN COMPARISON:  None. FINDINGS: There is mild degenerative listhesis at cervical and lumbar levels. Vertebral body heights are maintained apart from degenerative endplate irregularity primarily at cervical and lower lumbar levels. There is no marrow edema. No suspicious osseous lesion. No abnormal cord signal on the large field-of-view images. No evidence of abnormal leptomeningeal enhancement in the spine. Partially imaged intracranial enhancing lesion. Multilevel degenerative changes are present at cervical and lumbar levels without high-grade canal narrowing. Cervical foramina are difficult to evaluate. There is lumbar neural foraminal narrowing at L4-L5 and L5-S1. IMPRESSION: No evidence of metastatic disease. Electronically Signed   By: PMacy MisM.D.   On: 05/17/2021 14:44   IR IMAGING GUIDED PORT INSERTION  Result Date: 05/12/2021 INDICATION: 70year-old female  with history of intracranial lymphoma requiring central venous access for chemotherapy. EXAM: IMPLANTED PORT A CATH PLACEMENT WITH ULTRASOUND AND FLUOROSCOPIC GUIDANCE COMPARISON:  None. MEDICATIONS: None. ANESTHESIA/SEDATION: Moderate (conscious) sedation was employed during this procedure. A total of Versed 2 mg and Fentanyl 100 mcg was administered intravenously. Moderate Sedation Time: 18 minutes. The patient's level of consciousness and vital signs were monitored continuously by radiology nursing throughout the procedure under my direct supervision. CONTRAST:  None FLUOROSCOPY TIME:  0.1 minutes, (04.01mGy) COMPLICATIONS: None immediate. PROCEDURE: The procedure, risks, benefits, and alternatives were explained to the patient. Questions regarding the procedure were encouraged and answered. The patient understands and consents to the procedure. The right neck and chest were prepped with chlorhexidine in a sterile fashion, and a sterile drape was applied covering the operative field. Maximum barrier sterile technique with sterile gowns and gloves were used for the procedure. A timeout was performed prior to the initiation of the procedure. Ultrasound was used to examine the jugular vein which was compressible and free of internal echoes. A skin marker was used to demarcate the planned venotomy and port pocket incision sites. Local anesthesia was provided to these sites  and the subcutaneous tunnel track with 1% lidocaine with 1:100,000 epinephrine. A small incision was created at the jugular access site and blunt dissection was performed of the subcutaneous tissues. Under ultrasound guidance, the jugular vein was accessed with a 21 ga micropuncture needle and an 0.018" wire was inserted to the superior vena cava. Real-time ultrasound guidance was utilized for vascular access including the acquisition of a permanent ultrasound image documenting patency of the accessed vessel. A 5 Fr micopuncture set was then used,  through which a 0.035" Rosen wire was passed under fluoroscopic guidance into the inferior vena cava. An 8 Fr dilator was then placed over the wire. A subcutaneous port pocket was then created along the upper chest wall utilizing a combination of sharp and blunt dissection. The pocket was irrigated with sterile saline, packed with gauze, and observed for hemorrhage. A single lumen "ISP" sized power injectable port was chosen for placement. The 8 Fr catheter was tunneled from the port pocket site to the venotomy incision. The port was placed in the pocket. The external catheter was trimmed to appropriate length. The dilator was exchanged for an 8 Fr peel-away sheath under fluoroscopic guidance. The catheter was then placed through the sheath and the sheath was removed. Final catheter positioning was confirmed and documented with a fluoroscopic spot radiograph. The port was accessed with a Huber needle, aspirated, and flushed with heparinized saline. The deep dermal layer of the port pocket incision was closed with interrupted 3-0 Vicryl suture. Dermabond was then placed over the port pocket and neck incisions. The patient tolerated the procedure well without immediate post procedural complication. FINDINGS: After catheter placement, the tip lies within the superior cavoatrial junction. The catheter aspirates and flushes normally and is ready for immediate use. IMPRESSION: Successful placement of a power injectable Port-A-Cath via the right internal jugular vein. The catheter is ready for immediate use. Ruthann Cancer, MD [facility] Electronically Signed   By: Ruthann Cancer MD   On: 05/12/2021 09:19    Pathology: SURGICAL PATHOLOGY  CASE: [phone]  PATIENT: Audrey Barron  Surgical Pathology Report   Specimen Submitted:  A. Temporal lesion, left   Clinical History: Left brain mass   DIAGNOSIS:  A. BRAIN, LEFT TEMPORAL LOBE; CRANIOTOMY  WITH BIOPSY:  - HIGH GRADE B-CELL LYMPHOMA, COMPATIBLE WITH PRIMARY CNS LYMPHOMA.   Comment:  Biopsy sections display neural tissue predominantly replaced by an  abnormal proliferation of intermediate to large lymphocytes. These  lymphocytes display vesicular chromatin, prominent nucleoli, high N:C  ratio, and abnormal nuclear contours. Apoptotic debris is abundant. In  addition, there are patchy areas of tumor necrosis.   Immunohistochemical studies show this abnormal proliferation to be  comprised of a large CD20+ B cells, with relatively few CD3+ T cells.  Abnormal B cells are positive for BCL-6 and MUM-1, and negative for  BCL-2. CD10 shows diffuse dim blush like staining, the significance of  which is unclear. Cyclin-D1 and CD30 are negative. c-myc is positive,  marking 50-60% of abnormal B cells. Ki-67 is significantly elevated,  with positivity in greater than 95% of abnormal B-cells. An  Epstein-Barr virus encoded RNA (EBER) ISH probe is negative.   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 Although CD10 positivity within this entity would  be rare, the absence of radiographic evidence of peripheral adenopathy  or other potential sites of involvement would favor  a primary CNS  lymphoma. There is sufficient tissue present for ancillary molecular  testing if desired.    Assessment/Plan Primary CNS lymphoma ([place]) [C85.89]  05/30/21:  Methotrexate C1D1, rx delivered safely via port.  Please decrease IVF (1/4NS w/ 128mq bicarb) to 1051mhr in light of urine ph of 9.  Methotrexate level send-out tomorrow AM  -------------------------------------------  We appreciate the opportunity to participate in the care of BaJunita Barron She again presents with clinical, radiographic, histologic syndrome consistent with Primary B-cell CNS Lymphoma, with no  evidence of leptomeningeal or ocular spread.  -once urine alkalinized, appropriate to proceed with C1of high dose MTX with leucovorin rescue inpatient -MTX-Rtherapy C1D1 likely tomorrow 05/30/21: Methotrexate 3.5g/m2given over 4 hoursonce urine pH >= 7.0 -Leucovorin1630m6 hours 24 hours after MTX infusion -Rituxancan be given 24-48 hours after MTX infusion -To start ASAP: IV fluids, 1/4NS with 100m64modium bicarbonate at 125mL41m Need toadjust rate to urine pH >= 7 -daily labs with cbc, cmp -daily urine pH once it reaches target pH of >7 -daily MTX levelsuntilMTX levels <0.10 -continue same supportive medicationswith exception of protonix which interferes with clearance of MTX  -continue decadron 4mg d39my -LFTs and renal function both normal at this time   HCV serum screening antibody test and HCV RNA were positive.  Rituximab component of treatment regimen may need to be altered if viral load increases or patient develops elevated liver enzymes.   Impact of chronic HCV infection, if demonstrated, may not affect mortality but greatly increase risk of treatment related hepatotoxicity: Daisuke Ennishi, et al. Hepatic toxicity and prognosis in hepatitis C virus-infected patients with diffuse large B-cell lymphoma treated with rituximab-containing chemotherapy regimens: a [facility] analysis. Blood 2010; 116 (24): [id number] questions were answered. The patient knows to call the clinic with any problems, questions or concerns. No barriers to learning were detected.  ---------------------------------------------------------------  The total time spent in the encounter was 30 minutes and more than 50% was on counseling and review of test results   ZacharVentura Sellersedical Director of Neuro-Oncology [facility] [place]/22 3:49 PM

## 2021-05-31 LAB — URINALYSIS, ROUTINE W REFLEX MICROSCOPIC
Bilirubin Urine: NEGATIVE
Glucose, UA: NEGATIVE mg/dL
Hgb urine dipstick: NEGATIVE
Ketones, ur: NEGATIVE mg/dL
Leukocytes,Ua: NEGATIVE
Nitrite: NEGATIVE
Protein, ur: NEGATIVE mg/dL
Specific Gravity, Urine: 1.008 (ref 1.005–1.030)
pH: 9 — ABNORMAL HIGH (ref 5.0–8.0)

## 2021-05-31 LAB — COMPREHENSIVE METABOLIC PANEL
ALT: 41 U/L (ref 0–44)
AST: 30 U/L (ref 15–41)
Albumin: 3.2 g/dL — ABNORMAL LOW (ref 3.5–5.0)
Alkaline Phosphatase: 27 U/L — ABNORMAL LOW (ref 38–126)
Anion gap: 8 (ref 5–15)
BUN: 12 mg/dL (ref 8–23)
CO2: 29 mmol/L (ref 22–32)
Calcium: 8.7 mg/dL — ABNORMAL LOW (ref 8.9–10.3)
Chloride: 98 mmol/L (ref 98–111)
Creatinine, Ser: 0.48 mg/dL (ref 0.44–1.00)
GFR, Estimated: >60 mL/min (ref >60)
Glucose, Bld: 90 mg/dL (ref 70–99)
Potassium: 3.8 mmol/L (ref 3.5–5.1)
Sodium: 135 mmol/L (ref 135–145)
Total Bilirubin: 1.1 mg/dL (ref 0.3–1.2)
Total Protein: 5.9 g/dL — ABNORMAL LOW (ref 6.5–8.1)

## 2021-05-31 LAB — CBC
HCT: 40.1 % (ref 36.0–46.0)
Hemoglobin: 13.5 g/dL (ref 12.0–15.0)
MCH: 28.8 pg (ref 26.0–34.0)
MCHC: 33.7 g/dL (ref 30.0–36.0)
MCV: 85.5 fL (ref 80.0–100.0)
Platelets: 218 10*3/uL (ref 150–400)
RBC: 4.69 MIL/uL (ref 3.87–5.11)
RDW: 13.1 % (ref 11.5–15.5)
WBC: 7.7 10*3/uL (ref 4.0–10.5)
nRBC: 0 % (ref 0.0–0.2)

## 2021-05-31 MED ORDER — ACETAMINOPHEN 325 MG PO TABS
650.0000 mg | ORAL_TABLET | Freq: Once | ORAL | Status: AC
  Administered 2021-05-31: 650 mg via ORAL
  Filled 2021-05-31: qty 2

## 2021-05-31 MED ORDER — SODIUM CHLORIDE 0.9 % IV SOLN: Freq: Once | INTRAVENOUS | Status: AC

## 2021-05-31 MED ORDER — LEUCOVORIN CALCIUM INJECTION 100 MG
15.0000 mg | Freq: Four times a day (QID) | INTRAMUSCULAR | Status: AC
  Administered 2021-05-31 – 2021-06-01 (×4): 16 mg via INTRAVENOUS
  Filled 2021-05-31 (×9): qty 0.8

## 2021-05-31 MED ORDER — DIPHENHYDRAMINE HCL 50 MG PO CAPS
50.0000 mg | ORAL_CAPSULE | Freq: Once | ORAL | Status: AC
  Administered 2021-05-31: 50 mg via ORAL
  Filled 2021-05-31: qty 1

## 2021-05-31 MED ORDER — SODIUM CHLORIDE 0.9 % IV SOLN
Freq: Once | INTRAVENOUS | Status: AC
  Administered 2021-05-31: 16 mg via INTRAVENOUS
  Filled 2021-05-31: qty 8

## 2021-05-31 MED ORDER — SODIUM BICARBONATE 8.4 % IV SOLN
INTRAVENOUS | Status: DC
  Filled 2021-05-31 (×2): qty 1000
  Filled 2021-05-31 (×2): qty 150

## 2021-05-31 MED ORDER — SODIUM CHLORIDE 0.9 % IV SOLN
375.0000 mg/m2 | Freq: Once | INTRAVENOUS | Status: AC
  Administered 2021-05-31: 600 mg via INTRAVENOUS
  Filled 2021-05-31: qty 10

## 2021-05-31 NOTE — Progress Notes (Signed)
[facility], [place] [address] [phone]   Interval Evaluation  Date of Service: 05/19/21 Patient Name: Audrey Barron Patient MRN: [id number] Patient DOB: Aug 22, 1951 Provider: Ventura Sellers, MD  Identifying Statement:  Audrey Barron is a 70 y.o. female with left hemispheric Primary CNS Lymphoma   Oncologic History: Oncology History  Primary CNS lymphoma ([place])  04/28/2021 Surgery   Open biopsy with Dr. Izora Ribas; path demonstrates CNS B-cell lymphoma, CD20+   05/30/2021 -  Chemotherapy    Patient is on Treatment Plan: IP NON-HODGKINS LYMPHOMA HIGH DOSE METHOTREXATE + LEUCOVORIN RESCUE   Patient is on Antibody Plan: NON-HODGKINS LYMPHOMA RITUXIMAB Q21D    05/31/2021 -  Chemotherapy    Patient is on Treatment Plan: IP NON-HODGKINS LYMPHOMA HIGH DOSE METHOTREXATE + LEUCOVORIN RESCUE   Patient is on Antibody Plan: NON-HODGKINS LYMPHOMA RITUXIMAB Q21D      Biomarkers:  CD20 positive.  Bcl-6 positive  Ki-67 95%      Interval History:  Audrey Barron is resting comfortably today, no issues with rituximab today.  She denies any new complaints today, tolerated treatment well.  (05/29/21) Patient presents today for initiation of induction therapy with methotrexate and rituximab. No new complaints prior to admission. Denies seizures, headaches.  No new deficits are described.  No difficulty with any of the staging workup, including port placement.  H+P (05/05/21) Patient presented to medical attention in April, with 2-3 months history of confusion, impaired speech, balance and gait impairment.  Neurologist ordered an MRI, which demonstrated infiltrative tumor within much of left hemisphere.  She underwent craniotomy and open biopsy on 04/28/21 with Dr. Izora Ribas at [facility], which yielded a B-cell CNS Lymphoma.  Following surgery, she has continued to experience difficulty speaking, weakness on the right side, and  poor balance.  She is unable to walk without assistance at home.  At present she is reliant on her daughter for help with dressing, cooking, tolieting; a majority of activities of daily living.  She has been dosing decadron 11m three times per day; this has not clearly led to any improvements in functional status.   Medications: No current facility-administered medications on file prior to encounter.   Current Outpatient Medications on File Prior to Encounter  Medication Sig Dispense Refill  . acetaminophen (TYLENOL) 500 MG tablet Take 500 mg by mouth at bedtime.    . cyanocobalamin 1000 MCG tablet Take 1,000 mcg by mouth daily.    .Marland Kitchendexamethasone (DECADRON) 4 MG tablet Take 1 tablet (4 mg total) by mouth daily. 30 tablet 3  . diphenhydrAMINE (BENADRYL) 50 MG capsule Take 1 capsule (50 mg total) by mouth at bedtime as needed for sleep. 30 capsule 0  . levETIRAcetam (KEPPRA) 500 MG tablet Take 1 tablet (500 mg total) by mouth 2 (two) times daily. (Patient not taking: Reported on 05/29/2021) 60 tablet 0  . polyethylene glycol (MIRALAX / GLYCOLAX) 17 g packet Take 17 g by mouth daily. (Patient not taking: Reported on 05/29/2021) 14 each 0    Allergies: No Known Allergies Past Medical History:  Past Medical History:  Diagnosis Date  . [place]    lymphoma  . Dementia ([facility]    Past Surgical History:  Past Surgical History:  Procedure Laterality Date  . APPLICATION OF CRANIAL NAVIGATION N/A 04/28/2021   Procedure: APPLICATION OF CRANIAL NAVIGATION;  Surgeon: YMeade Maw MD;  Location: [facility] ORS;  Service: Neurosurgery;  Laterality: N/A;  . CRANIOTOMY Left 04/28/2021  Procedure: CRANIOTOMY TUMOR EXCISION;  Surgeon: Meade Maw, MD;  Location: [facility] ORS;  Service: Neurosurgery;  Laterality: Left;  . IR IMAGING GUIDED PORT INSERTION  05/12/2021   Social History:  Social History   Socioeconomic History  . Marital status: Single    Spouse name: Not on file  . Number of children:  Not on file  . Years of education: Not on file  . Highest education level: Not on file  Occupational History  . Not on file  Tobacco Use  . Smoking status: Never Smoker  . Smokeless tobacco: Never Used  Substance and Sexual Activity  . Alcohol use: Not Currently  . Drug use: Not Currently  . Sexual activity: Not on file  Other Topics Concern  . Not on file  Social History Narrative  . Not on file   Social Determinants of Health   Financial Resource Strain: Not on file  Food Insecurity: Not on file  Transportation Needs: Not on file  Physical Activity: Not on file  Stress: Not on file  Social Connections: Not on file  Intimate Partner Violence: Not on file   Family History: History reviewed. No pertinent family history.  Review of Systems: Constitutional: Doesn't report fevers, chills or abnormal weight loss Eyes: Doesn't report blurriness of vision Ears, nose, mouth, throat, and face: Doesn't report sore throat Respiratory: Doesn't report cough, dyspnea or wheezes Cardiovascular: Doesn't report palpitation, chest discomfort  Gastrointestinal: +constipation GU: Doesn't report incontinence Skin: Doesn't report skin rashes Neurological: Per HPI Musculoskeletal: Doesn't report joint pain Behavioral/Psych: Doesn't report anxiety  Physical Exam: Vitals:   05/31/21 1413 05/31/21 1435  BP: 123/88 124/79  Pulse: 76 78  Resp: 19 16  Temp: 98.2 F (36.8 C) 98.1 F (36.7 C)  SpO2: 97% 95%   KPS: 60. General: Alert, cooperative, pleasant, in no acute distress Head: Normal EENT: No conjunctival injection or scleral icterus.  Lungs: Resp effort normal Cardiac: Regular rate Abdomen: Non-distended abdomen Skin: No rashes cyanosis or petechiae. Extremities: No clubbing or edema  Neurologic Exam: Mental Status: Awake, alert, attentive to examiner. Oriented to self and environment. Language is mildly impaired with regards to both fluency and comprehension.  Further mental  status evaluation limited by dysphasia. Cranial Nerves: Visual acuity is grossly normal. Visual fields are full. Extra-ocular movements intact. No ptosis. Face is symmetric Motor: Tone and bulk are normal. Power is 4+/5 in right arm and leg. Reflexes are symmetric, no pathologic reflexes present.  Sensory: Intact to light touch Gait: Dystaxic   Labs: I have reviewed the data as listed    Component Value Date/Time   NA 135 05/31/2021 0520   K 3.8 05/31/2021 0520   CL 98 05/31/2021 0520   CO2 29 05/31/2021 0520   GLUCOSE 90 05/31/2021 0520   BUN 12 05/31/2021 0520   CREATININE 0.48 05/31/2021 0520   CALCIUM 8.7 (L) 05/31/2021 0520   PROT 5.9 (L) 05/31/2021 0520   ALBUMIN 3.2 (L) 05/31/2021 0520   AST 30 05/31/2021 0520   ALT 41 05/31/2021 0520   ALKPHOS 27 (L) 05/31/2021 0520   BILITOT 1.1 05/31/2021 0520   GFRNONAA >60 05/31/2021 0520   Lab Results  Component Value Date   WBC 7.7 05/31/2021   HGB 13.5 05/31/2021   HCT 40.1 05/31/2021   MCV 85.5 05/31/2021   PLT 218 05/31/2021    Imaging:  NM PET Image Initial (PI) Skull Base To Thigh  Result Date: 05/16/2021 CLINICAL DATA:  Initial treatment strategy for CNS lymphoma.  EXAM: NUCLEAR MEDICINE PET SKULL BASE TO THIGH TECHNIQUE: 6.9 mCi F-18 FDG was injected intravenously. Full-ring PET imaging was performed from the skull base to thigh after the radiotracer. CT data was obtained and used for attenuation correction and anatomic localization. Fasting blood glucose: 91 mg/dl COMPARISON:  Head CT 04/28/2021. Chest abdomen pelvis CT 04/27/2021. FINDINGS: Mediastinal blood pool activity: SUV max 2.0 Liver activity: SUV max NA NECK: Images which included the lower brain show asymmetric hypermetabolism in the midline and left hemisphere. No hypermetabolic lymphadenopathy in the neck. Incidental CT findings: none CHEST: No hypermetabolic mediastinal or hilar nodes. No suspicious pulmonary nodules on the CT scan. Incidental CT findings:  Right Port-A-Cath tip is positioned in the distal SVC. Basilar atelectasis noted bilaterally. ABDOMEN/PELVIS: No abnormal hypermetabolic activity within the liver, pancreas, adrenal glands, or spleen. No hypermetabolic lymph nodes in the abdomen or pelvis. Incidental CT findings: Small scattered cysts noted. There is abdominal aortic atherosclerosis without aneurysm. SKELETON: No focal hypermetabolic activity to suggest skeletal metastasis. Incidental CT findings: none IMPRESSION: 1. No unexpected or suspicious hypermetabolism in the neck, chest, abdomen, or pelvis. Electronically Signed   By: Misty Stanley M.D.   On: 05/16/2021 11:41   MR TOTAL SPINE METS SCREENING  Result Date: 05/17/2021 CLINICAL DATA:  CNS lymphoma, staging EXAM: MRI TOTAL SPINE WITHOUT AND WITH CONTRAST TECHNIQUE: Multisequence MR imaging of the spine from the cervical spine to the sacrum was performed prior to and following IV contrast administration for evaluation of spinal metastatic disease. CONTRAST:  55m GADAVIST GADOBUTROL 1 MMOL/ML IV SOLN COMPARISON:  None. FINDINGS: There is mild degenerative listhesis at cervical and lumbar levels. Vertebral body heights are maintained apart from degenerative endplate irregularity primarily at cervical and lower lumbar levels. There is no marrow edema. No suspicious osseous lesion. No abnormal cord signal on the large field-of-view images. No evidence of abnormal leptomeningeal enhancement in the spine. Partially imaged intracranial enhancing lesion. Multilevel degenerative changes are present at cervical and lumbar levels without high-grade canal narrowing. Cervical foramina are difficult to evaluate. There is lumbar neural foraminal narrowing at L4-L5 and L5-S1. IMPRESSION: No evidence of metastatic disease. Electronically Signed   By: PMacy MisM.D.   On: 05/17/2021 14:44   IR IMAGING GUIDED PORT INSERTION  Result Date: 05/12/2021 INDICATION: 70year-old female with history of  intracranial lymphoma requiring central venous access for chemotherapy. EXAM: IMPLANTED PORT A CATH PLACEMENT WITH ULTRASOUND AND FLUOROSCOPIC GUIDANCE COMPARISON:  None. MEDICATIONS: None. ANESTHESIA/SEDATION: Moderate (conscious) sedation was employed during this procedure. A total of Versed 2 mg and Fentanyl 100 mcg was administered intravenously. Moderate Sedation Time: 18 minutes. The patient's level of consciousness and vital signs were monitored continuously by radiology nursing throughout the procedure under my direct supervision. CONTRAST:  None FLUOROSCOPY TIME:  0.1 minutes, (07.84mGy) COMPLICATIONS: None immediate. PROCEDURE: The procedure, risks, benefits, and alternatives were explained to the patient. Questions regarding the procedure were encouraged and answered. The patient understands and consents to the procedure. The right neck and chest were prepped with chlorhexidine in a sterile fashion, and a sterile drape was applied covering the operative field. Maximum barrier sterile technique with sterile gowns and gloves were used for the procedure. A timeout was performed prior to the initiation of the procedure. Ultrasound was used to examine the jugular vein which was compressible and free of internal echoes. A skin marker was used to demarcate the planned venotomy and port pocket incision sites. Local anesthesia was provided to these sites and the  subcutaneous tunnel track with 1% lidocaine with 1:100,000 epinephrine. A small incision was created at the jugular access site and blunt dissection was performed of the subcutaneous tissues. Under ultrasound guidance, the jugular vein was accessed with a 21 ga micropuncture needle and an 0.018" wire was inserted to the superior vena cava. Real-time ultrasound guidance was utilized for vascular access including the acquisition of a permanent ultrasound image documenting patency of the accessed vessel. A 5 Fr micopuncture set was then used, through which a  0.035" Rosen wire was passed under fluoroscopic guidance into the inferior vena cava. An 8 Fr dilator was then placed over the wire. A subcutaneous port pocket was then created along the upper chest wall utilizing a combination of sharp and blunt dissection. The pocket was irrigated with sterile saline, packed with gauze, and observed for hemorrhage. A single lumen "ISP" sized power injectable port was chosen for placement. The 8 Fr catheter was tunneled from the port pocket site to the venotomy incision. The port was placed in the pocket. The external catheter was trimmed to appropriate length. The dilator was exchanged for an 8 Fr peel-away sheath under fluoroscopic guidance. The catheter was then placed through the sheath and the sheath was removed. Final catheter positioning was confirmed and documented with a fluoroscopic spot radiograph. The port was accessed with a Huber needle, aspirated, and flushed with heparinized saline. The deep dermal layer of the port pocket incision was closed with interrupted 3-0 Vicryl suture. Dermabond was then placed over the port pocket and neck incisions. The patient tolerated the procedure well without immediate post procedural complication. FINDINGS: After catheter placement, the tip lies within the superior cavoatrial junction. The catheter aspirates and flushes normally and is ready for immediate use. IMPRESSION: Successful placement of a power injectable Port-A-Cath via the right internal jugular vein. The catheter is ready for immediate use. Ruthann Cancer, MD [facility] Radiology Electronically Signed   By: Ruthann Cancer MD   On: 05/12/2021 09:19    Pathology: SURGICAL PATHOLOGY  CASE: [id number]  PATIENT: Junita Push  Surgical Pathology Report   Specimen Submitted:  A. Temporal lesion, left   Clinical History: Left brain mass   DIAGNOSIS:  A. BRAIN, LEFT TEMPORAL LOBE; CRANIOTOMY WITH BIOPSY:  -  HIGH GRADE B-CELL LYMPHOMA, COMPATIBLE WITH PRIMARY CNS LYMPHOMA.   Comment:  Biopsy sections display neural tissue predominantly replaced by an  abnormal proliferation of intermediate to large lymphocytes. These  lymphocytes display vesicular chromatin, prominent nucleoli, high N:C  ratio, and abnormal nuclear contours. Apoptotic debris is abundant. In  addition, there are patchy areas of tumor necrosis.   Immunohistochemical studies show this abnormal proliferation to be  comprised of a large CD20+ B cells, with relatively few CD3+ T cells.  Abnormal B cells are positive for BCL-6 and MUM-1, and negative for  BCL-2. CD10 shows diffuse dim blush like staining, the significance of  which is unclear. Cyclin-D1 and CD30 are negative. c-myc is positive,  marking 50-60% of abnormal B cells. Ki-67 is significantly elevated,  with positivity in greater than 95% of abnormal B-cells. An  Epstein-Barr virus encoded RNA (EBER) ISH probe is negative.   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 Although CD10 positivity within this entity would  be rare, the absence of radiographic evidence of peripheral adenopathy  or other potential sites of involvement would favor a primary  CNS  lymphoma. There is sufficient tissue present for ancillary molecular  testing if desired.    Assessment/Plan Primary CNS lymphoma ([place]) [C85.89]  05/31/21:  Methotrexate C1D12, rx delivered safely via port.  Rituximab 334m administered today.   Please decrease IVF (1/4NS w/ 1256m bicarb) to [id number] in light of urine ph of 9.  Con't leucovorin rescue q6h  Methotrexate level pending, need confirmation of send-out from lab  -------------------------------------------  We appreciate the opportunity to participate in the care of [facility] presents with clinical,  radiographic, histologic syndrome consistent with Primary B-cell CNS Lymphoma, with no evidence of leptomeningeal or ocular spread.  -once urine alkalinized, appropriate to proceed with C1of high dose MTX with leucovorin rescue inpatient -MTX-Rtherapy C1D1 likely tomorrow 05/30/21: Methotrexate 3.5g/m2given over 4 hoursonce urine pH >= 7.0 -Leucovorin16m38m hours 24 hours after MTX infusion -Rituxancan be given 24-48 hours after MTX infusion -To start ASAP: IV fluids, 1/4NS with 100mE11mdium bicarbonate at 125mL/28mNeed toadjust rate to urine pH >= 7 -daily labs with cbc, cmp -daily urine pH once it reaches target pH of >7 -daily MTX levelsuntilMTX levels <0.10 -continue same supportive medicationswith exception of protonix which interferes with clearance of MTX  -continue decadron 4mg da58m -LFTs and renal function both normal at this time   HCV serum screening antibody test and HCV RNA were positive.  Rituximab component of treatment regimen may need to be altered if viral load increases or patient develops elevated liver enzymes.   Impact of chronic HCV infection, if demonstrated, may not affect mortality but greatly increase risk of treatment related hepatotoxicity: Daisuke Ennishi, et al. Hepatic toxicity and prognosis in hepatitis C virus-infected patients with diffuse large B-cell lymphoma treated with rituximab-containing chemotherapy regimens: a JapanesLebanonenter analysis. Blood 2010; 116 (24): [id number] questions were answered. The patient knows to call the clinic with any problems, questions or concerns. No barriers to learning were detected.  ---------------------------------------------------------------  The total time spent in the encounter was 30 minutes and more than 50% was on counseling and review of test results   ZacharyVentura Sellersdical Director of Neuro-Oncology [facility] [place]22 3:42 PM

## 2021-06-01 ENCOUNTER — Encounter: Payer: Self-pay | Admitting: Internal Medicine

## 2021-06-01 LAB — URINALYSIS, ROUTINE W REFLEX MICROSCOPIC
Bilirubin Urine: NEGATIVE
Glucose, UA: NEGATIVE mg/dL
Hgb urine dipstick: NEGATIVE
Ketones, ur: NEGATIVE mg/dL
Leukocytes,Ua: NEGATIVE
Nitrite: NEGATIVE
Protein, ur: NEGATIVE mg/dL
Specific Gravity, Urine: 1.017 (ref 1.005–1.030)
pH: 9 — ABNORMAL HIGH (ref 5.0–8.0)

## 2021-06-01 LAB — CBC
HCT: 37.4 % (ref 36.0–46.0)
Hemoglobin: 12.6 g/dL (ref 12.0–15.0)
MCH: 29.2 pg (ref 26.0–34.0)
MCHC: 33.7 g/dL (ref 30.0–36.0)
MCV: 86.6 fL (ref 80.0–100.0)
Platelets: 195 10*3/uL (ref 150–400)
RBC: 4.32 MIL/uL (ref 3.87–5.11)
RDW: 13.1 % (ref 11.5–15.5)
WBC: 6.6 10*3/uL (ref 4.0–10.5)
nRBC: 0 % (ref 0.0–0.2)

## 2021-06-01 LAB — COMPREHENSIVE METABOLIC PANEL
ALT: 55 U/L — ABNORMAL HIGH (ref 0–44)
AST: 45 U/L — ABNORMAL HIGH (ref 15–41)
Albumin: 2.9 g/dL — ABNORMAL LOW (ref 3.5–5.0)
Alkaline Phosphatase: 26 U/L — ABNORMAL LOW (ref 38–126)
Anion gap: 7 (ref 5–15)
BUN: 12 mg/dL (ref 8–23)
CO2: 30 mmol/L (ref 22–32)
Calcium: 8.7 mg/dL — ABNORMAL LOW (ref 8.9–10.3)
Chloride: 95 mmol/L — ABNORMAL LOW (ref 98–111)
Creatinine, Ser: 0.65 mg/dL (ref 0.44–1.00)
GFR, Estimated: >60 mL/min (ref >60)
Glucose, Bld: 121 mg/dL — ABNORMAL HIGH (ref 70–99)
Potassium: 3.6 mmol/L (ref 3.5–5.1)
Sodium: 132 mmol/L — ABNORMAL LOW (ref 135–145)
Total Bilirubin: 0.7 mg/dL (ref 0.3–1.2)
Total Protein: 5.6 g/dL — ABNORMAL LOW (ref 6.5–8.1)

## 2021-06-01 LAB — METHOTREXATE
Methotrexate: <0.05
Methotrexate: 5.82
Methotrexate: 0.25

## 2021-06-01 MED ORDER — SODIUM CHLORIDE 0.9 % IV SOLN
Freq: Once | INTRAVENOUS | Status: AC
  Administered 2021-06-01: 36 mg via INTRAVENOUS
  Filled 2021-06-01: qty 8

## 2021-06-01 MED ORDER — LEUCOVORIN CALCIUM INJECTION 100 MG
15.0000 mg | Freq: Four times a day (QID) | INTRAMUSCULAR | Status: AC
  Administered 2021-06-01 – 2021-06-02 (×4): 16 mg via INTRAVENOUS
  Filled 2021-06-01 (×10): qty 0.8

## 2021-06-01 NOTE — Care Management Important Message (Signed)
Important Message  Patient Details IM Letter given to the Patient. Name: Audrey Barron MRN: [id number] Date of Birth: 1951-04-23   Medicare Important Message Given:  Yes     Kerin Salen 06/01/2021, 9:06 AM

## 2021-06-01 NOTE — Progress Notes (Signed)
[facility] [place], [place] [address] [phone]   Interval Evaluation  Date of Service: 05/19/21 Patient Name: Audrey Barron Patient MRN: [id number] Patient DOB: 1951/07/07 Provider: Ventura Sellers, MD  Identifying Statement:  Audrey Barron is a 70 y.o. female with  left hemispheric   Primary CNS Lymphoma    Oncologic History: Oncology History  Primary CNS lymphoma ([place])  04/28/2021 Surgery   Open biopsy with Dr. Izora Ribas; path demonstrates CNS B-cell lymphoma, CD20+   05/30/2021 -  Chemotherapy    Patient is on Treatment Plan: IP NON-HODGKINS LYMPHOMA HIGH DOSE METHOTREXATE + LEUCOVORIN RESCUE   Patient is on Antibody Plan: NON-HODGKINS LYMPHOMA RITUXIMAB Q21D     05/31/2021 -  Chemotherapy    Patient is on Treatment Plan: IP NON-HODGKINS LYMPHOMA HIGH DOSE METHOTREXATE + LEUCOVORIN RESCUE   Patient is on Antibody Plan: NON-HODGKINS LYMPHOMA RITUXIMAB Q21D       Biomarkers:  CD20 positive .  Bcl-6 positive  Ki-67 95%      Interval History:  Audrey Barron is resting comfortably today, no concerning events overnight.  She denies any new complaints today, tolerated treatment well.  (05/29/21) Patient presents today for initiation of induction therapy with methotrexate and rituximab. No new complaints prior to admission. Denies seizures, headaches.  No new deficits are described.  No difficulty with any of the staging workup, including port placement.  H+P (05/05/21) Patient presented to medical attention in April, with 2-3 months history of confusion, impaired speech, balance and gait impairment.  Neurologist ordered an MRI, which demonstrated infiltrative tumor within much of left hemisphere.  She underwent craniotomy and open biopsy on 04/28/21 with Dr. Izora Ribas at [facility], which yielded a B-cell CNS Lymphoma.  Following surgery, she has continued to experience difficulty speaking, weakness on the right  side, and poor balance.  She is unable to walk without assistance at home.  At present she is reliant on her daughter for help with dressing, cooking, tolieting; a majority of activities of daily living.  She has been dosing decadron 62m three times per day; this has not clearly led to any improvements in functional status.   Medications: No current facility-administered medications on file prior to encounter.   Current Outpatient Medications on File Prior to Encounter  Medication Sig Dispense Refill   acetaminophen (TYLENOL) 500 MG tablet Take 500 mg by mouth at bedtime.     cyanocobalamin 1000 MCG tablet Take 1,000 mcg by mouth daily.     dexamethasone (DECADRON) 4 MG tablet Take 1 tablet (4 mg total) by mouth daily. 30 tablet 3   diphenhydrAMINE (BENADRYL) 50 MG capsule Take 1 capsule (50 mg total) by mouth at bedtime as needed for sleep. 30 capsule 0   levETIRAcetam (KEPPRA) 500 MG tablet Take 1 tablet (500 mg total) by mouth 2 (two) times daily. (Patient not taking: Reported on 05/29/2021) 60 tablet 0   polyethylene glycol (MIRALAX / GLYCOLAX) 17 g packet Take 17 g by mouth daily. (Patient not taking: Reported on 05/29/2021) 14 each 0    Allergies: No Known Allergies Past Medical History:  Past Medical History:  Diagnosis Date   [place]   [place]    Past Surgical History:  Past Surgical History:  Procedure Laterality Date   APPLICATION OF CRANIAL NAVIGATION N/A 04/28/2021   Procedure: APPLICATION OF CRANIAL NAVIGATION;  Surgeon: YMeade Maw MD;  Location: [facility] ORS;  Service: Neurosurgery;  Laterality:  N/A;   CRANIOTOMY Left 04/28/2021   Procedure: CRANIOTOMY TUMOR EXCISION;  Surgeon: Meade Maw, MD;  Location: [facility] ORS;  Service: Neurosurgery;  Laterality: Left;   IR IMAGING GUIDED PORT INSERTION  05/12/2021   Social History:  Social History   Socioeconomic History   Marital status: Single    Spouse name: Not on file   Number of children: Not  on file   Years of education: Not on file   Highest education level: Not on file  Occupational History   Not on file  Tobacco Use   Smoking status: Never   Smokeless tobacco: Never  Substance and Sexual Activity   Alcohol use: Not Currently   Drug use: Not Currently   Sexual activity: Not on file  Other Topics Concern   Not on file  Social History Narrative   Not on file   Social Determinants of Health   Financial Resource Strain: Not on file  Food Insecurity: Not on file  Transportation Needs: Not on file  Physical Activity: Not on file  Stress: Not on file  Social Connections: Not on file  Intimate Partner Violence: Not on file   Family History: History reviewed. No pertinent family history.  Review of Systems: Constitutional: Doesn't report fevers, chills or abnormal weight loss Eyes: Doesn't report blurriness of vision Ears, nose, mouth, throat, and face: Doesn't report sore throat Respiratory: Doesn't report cough, dyspnea or wheezes Cardiovascular: Doesn't report palpitation, chest discomfort  Gastrointestinal: +constipation GU: Doesn't report incontinence Skin: Doesn't report skin rashes Neurological: Per HPI Musculoskeletal: Doesn't report joint pain Behavioral/Psych: Doesn't report anxiety  Physical Exam: Vitals:   06/01/21 0432 06/01/21 1339  BP: (!) 156/96 (!) 150/99  Pulse: 60 71  Resp: 16 18  Temp: (!) 97.5 F (36.4 C) 98.3 F (36.8 C)  SpO2: 99% 96%   KPS: 60. General: Alert, cooperative, pleasant, in no acute distress Head: Normal EENT: No conjunctival injection or scleral icterus.  Lungs: Resp effort normal Cardiac: Regular rate Abdomen: Non-distended abdomen Skin: No rashes cyanosis or petechiae. Extremities: No clubbing or edema  Neurologic Exam: Mental Status: Awake, alert, attentive to examiner. Oriented to self and environment. Language is mildly impaired with regards to both fluency and comprehension.  Further mental status  evaluation limited by dysphasia. Cranial Nerves: Visual acuity is grossly normal. Visual fields are full. Extra-ocular movements intact. No ptosis. Face is symmetric Motor: Tone and bulk are normal. Power is 4+/5 in right arm and leg. Reflexes are symmetric, no pathologic reflexes present.  Sensory: Intact to light touch Gait: Dystaxic   Labs: I have reviewed the data as listed    Component Value Date/Time   NA 132 (L) 06/01/2021 0500   K 3.6 06/01/2021 0500   CL 95 (L) 06/01/2021 0500   CO2 30 06/01/2021 0500   GLUCOSE 121 (H) 06/01/2021 0500   BUN 12 06/01/2021 0500   CREATININE 0.65 06/01/2021 0500   CALCIUM 8.7 (L) 06/01/2021 0500   PROT 5.6 (L) 06/01/2021 0500   ALBUMIN 2.9 (L) 06/01/2021 0500   AST 45 (H) 06/01/2021 0500   ALT 55 (H) 06/01/2021 0500   ALKPHOS 26 (L) 06/01/2021 0500   BILITOT 0.7 06/01/2021 0500   GFRNONAA >60 06/01/2021 0500   Lab Results  Component Value Date   WBC 6.6 06/01/2021   HGB 12.6 06/01/2021   HCT 37.4 06/01/2021   MCV 86.6 06/01/2021   PLT 195 06/01/2021    Imaging:  NM PET Image Initial (PI) Skull Base To  Thigh  Result Date: 05/16/2021 CLINICAL DATA:  Initial treatment strategy for CNS lymphoma. EXAM: NUCLEAR MEDICINE PET SKULL BASE TO THIGH TECHNIQUE: 6.9 mCi F-18 FDG was injected intravenously. Full-ring PET imaging was performed from the skull base to thigh after the radiotracer. CT data was obtained and used for attenuation correction and anatomic localization. Fasting blood glucose: 91 mg/dl COMPARISON:  Head CT 04/28/2021. Chest abdomen pelvis CT 04/27/2021. FINDINGS: Mediastinal blood pool activity: SUV max 2.0 Liver activity: SUV max NA NECK: Images which included the lower brain show asymmetric hypermetabolism in the midline and left hemisphere. No hypermetabolic lymphadenopathy in the neck. Incidental CT findings: none CHEST: No hypermetabolic mediastinal or hilar nodes. No suspicious pulmonary nodules on the CT scan. Incidental CT  findings: Right Port-A-Cath tip is positioned in the distal SVC. Basilar atelectasis noted bilaterally. ABDOMEN/PELVIS: No abnormal hypermetabolic activity within the liver, pancreas, adrenal glands, or spleen. No hypermetabolic lymph nodes in the abdomen or pelvis. Incidental CT findings: Small scattered cysts noted. There is abdominal aortic atherosclerosis without aneurysm. SKELETON: No focal hypermetabolic activity to suggest skeletal metastasis. Incidental CT findings: none IMPRESSION: 1. No unexpected or suspicious hypermetabolism in the neck, chest, abdomen, or pelvis. Electronically Signed   By: Misty Stanley M.D.   On: 05/16/2021 11:41   MR TOTAL SPINE METS SCREENING  Result Date: 05/17/2021 CLINICAL DATA:  CNS lymphoma, staging EXAM: MRI TOTAL SPINE WITHOUT AND WITH CONTRAST TECHNIQUE: Multisequence MR imaging of the spine from the cervical spine to the sacrum was performed prior to and following IV contrast administration for evaluation of spinal metastatic disease. CONTRAST:  74m GADAVIST GADOBUTROL 1 MMOL/ML IV SOLN COMPARISON:  None. FINDINGS: There is mild degenerative listhesis at cervical and lumbar levels. Vertebral body heights are maintained apart from degenerative endplate irregularity primarily at cervical and lower lumbar levels. There is no marrow edema. No suspicious osseous lesion. No abnormal cord signal on the large field-of-view images. No evidence of abnormal leptomeningeal enhancement in the spine. Partially imaged intracranial enhancing lesion. Multilevel degenerative changes are present at cervical and lumbar levels without high-grade canal narrowing. Cervical foramina are difficult to evaluate. There is lumbar neural foraminal narrowing at L4-L5 and L5-S1. IMPRESSION: No evidence of metastatic disease. Electronically Signed   By: PMacy MisM.D.   On: 05/17/2021 14:44   IR IMAGING GUIDED PORT INSERTION  Result Date: 05/12/2021 INDICATION: 70year-old female with history  of intracranial lymphoma requiring central venous access for chemotherapy. EXAM: IMPLANTED PORT A CATH PLACEMENT WITH ULTRASOUND AND FLUOROSCOPIC GUIDANCE COMPARISON:  None. MEDICATIONS: None. ANESTHESIA/SEDATION: Moderate (conscious) sedation was employed during this procedure. A total of Versed 2 mg and Fentanyl 100 mcg was administered intravenously. Moderate Sedation Time: 18 minutes. The patient's level of consciousness and vital signs were monitored continuously by radiology nursing throughout the procedure under my direct supervision. CONTRAST:  None FLUOROSCOPY TIME:  0.1 minutes, (09.38mGy) COMPLICATIONS: None immediate. PROCEDURE: The procedure, risks, benefits, and alternatives were explained to the patient. Questions regarding the procedure were encouraged and answered. The patient understands and consents to the procedure. The right neck and chest were prepped with chlorhexidine in a sterile fashion, and a sterile drape was applied covering the operative field. Maximum barrier sterile technique with sterile gowns and gloves were used for the procedure. A timeout was performed prior to the initiation of the procedure. Ultrasound was used to examine the jugular vein which was compressible and free of internal echoes. A skin marker was used to demarcate the planned venotomy  and port pocket incision sites. Local anesthesia was provided to these sites and the subcutaneous tunnel track with 1% lidocaine with 1:100,000 epinephrine. A small incision was created at the jugular access site and blunt dissection was performed of the subcutaneous tissues. Under ultrasound guidance, the jugular vein was accessed with a 21 ga micropuncture needle and an 0.018" wire was inserted to the superior vena cava. Real-time ultrasound guidance was utilized for vascular access including the acquisition of a permanent ultrasound image documenting patency of the accessed vessel. A 5 Fr micopuncture set was then used, through which  a 0.035" Rosen wire was passed under fluoroscopic guidance into the inferior vena cava. An 8 Fr dilator was then placed over the wire. A subcutaneous port pocket was then created along the upper chest wall utilizing a combination of sharp and blunt dissection. The pocket was irrigated with sterile saline, packed with gauze, and observed for hemorrhage. A single lumen "ISP" sized power injectable port was chosen for placement. The 8 Fr catheter was tunneled from the port pocket site to the venotomy incision. The port was placed in the pocket. The external catheter was trimmed to appropriate length. The dilator was exchanged for an 8 Fr peel-away sheath under fluoroscopic guidance. The catheter was then placed through the sheath and the sheath was removed. Final catheter positioning was confirmed and documented with a fluoroscopic spot radiograph. The port was accessed with a Huber needle, aspirated, and flushed with heparinized saline. The deep dermal layer of the port pocket incision was closed with interrupted 3-0 Vicryl suture. Dermabond was then placed over the port pocket and neck incisions. The patient tolerated the procedure well without immediate post procedural complication. FINDINGS: After catheter placement, the tip lies within the superior cavoatrial junction. The catheter aspirates and flushes normally and is ready for immediate use. IMPRESSION: Successful placement of a power injectable Port-A-Cath via the right internal jugular vein. The catheter is ready for immediate use. Ruthann Cancer, MD [facility] Electronically Signed   By: Ruthann Cancer MD   On: 05/12/2021 09:19     Pathology: SURGICAL PATHOLOGY  CASE: [id number]  PATIENT: Audrey Barron  Surgical Pathology Report   Specimen Submitted:  A. Temporal lesion, left   Clinical History: Left brain mass   DIAGNOSIS:  A. BRAIN, LEFT TEMPORAL LOBE; CRANIOTOMY WITH BIOPSY:  -  HIGH GRADE B-CELL LYMPHOMA, COMPATIBLE WITH PRIMARY CNS LYMPHOMA.   Comment:  Biopsy sections display neural tissue predominantly replaced by an  abnormal proliferation of intermediate to large lymphocytes.  These  lymphocytes display vesicular chromatin, prominent nucleoli, high N:C  ratio, and abnormal nuclear contours.  Apoptotic debris is abundant.  In  addition, there are patchy areas of tumor necrosis.   Immunohistochemical studies show this abnormal proliferation to be  comprised of a large CD20+ B cells, with relatively few CD3+ T cells.  Abnormal B cells are positive for BCL-6 and MUM-1, and negative for  BCL-2.  CD10 shows diffuse dim blush like staining, the significance of  which is unclear.  Cyclin-D1 and CD30 are negative. c-myc is positive,  marking 50-60% of abnormal B cells.  Ki-67 is significantly elevated,  with positivity in greater than 95% of abnormal B-cells.  An  Epstein-Barr virus encoded RNA (EBER) ISH probe is negative.   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  Although CD10 positivity within this entity  would  be rare, the absence of radiographic evidence of peripheral adenopathy  or other potential sites of involvement would favor a primary CNS  lymphoma.  There is sufficient tissue present for ancillary molecular  testing if desired.    Assessment/Plan Primary CNS lymphoma ([place]) [C85.89]  06/01/21:  Methotrexate C1D13, rx delivered safely via port.  Rituximab 380m administered on day 2.   Please con't IVF (1/4NS w/ 1233m bicarb) at 7545mr in light of urine ph of 9.  Con't leucovorin rescue q6h  Methotrexate level today 0.25  Hopeful for discharge tomorrow, assuming MTX level clears 0.10  -------------------------------------------  We appreciate the opportunity to participate in the care of [facility] again  presents with clinical, radiographic, histologic syndrome consistent with Primary B-cell CNS Lymphoma, with no evidence of leptomeningeal or ocular spread.    -once urine alkalinized, appropriate to proceed with C1 of high dose MTX with leucovorin rescue inpatient -MTX-R therapy C1D1 likely tomorrow 05/30/21: Methotrexate 3.5g/m2 given over 4 hours once urine pH >= 7.0 -Leucovorin 31m87m hours 24 hours after MTX infusion   -Rituxan can be given 24-48 hours after MTX infusion -To start ASAP: IV fluids, 1/4NS with 100mE50mdium bicarbonate at 125mL/29m Need to adjust rate to urine pH >= 7 -daily labs with cbc, cmp -daily urine pH once it reaches target pH of >7 -daily MTX levels until MTX levels <0.10 -continue same supportive medications with exception of protonix which interferes with clearance of MTX  -continue decadron 4mg da61m -LFTs and renal function both normal at this time   HCV serum screening antibody test and HCV RNA were positive.  Rituximab component of treatment regimen may need to be altered if viral load increases or patient develops elevated liver enzymes.   Impact of chronic HCV infection, if demonstrated, may not affect mortality but greatly increase risk of treatment related hepatotoxicity: Daisuke Ennishi, et al. Hepatic toxicity and prognosis in hepatitis C virus-infected patients with diffuse large B-cell lymphoma treated with rituximab-containing chemotherapy regimens: a [place]es[place]an[place]er analysis. Blood 2010; 116 (24): [id number] questions were answered. The patient knows to call the clinic with any problems, questions or concerns. No barriers to learning were detected.  ---------------------------------------------------------------  The total time spent in the encounter was 30 minutes and more than 50% was on counseling and review of test results   ZacharyVentura Sellersdical Director of Neuro-Oncology [facility] Guadalupe Guerra22  2:51 PM

## 2021-06-02 ENCOUNTER — Encounter: Payer: Self-pay | Admitting: Internal Medicine

## 2021-06-02 ENCOUNTER — Other Ambulatory Visit: Payer: Self-pay | Admitting: Internal Medicine

## 2021-06-02 LAB — CBC
HCT: 38.3 % (ref 36.0–46.0)
Hemoglobin: 13 g/dL (ref 12.0–15.0)
MCH: 29.2 pg (ref 26.0–34.0)
MCHC: 33.9 g/dL (ref 30.0–36.0)
MCV: 86.1 fL (ref 80.0–100.0)
Platelets: 203 10*3/uL (ref 150–400)
RBC: 4.45 MIL/uL (ref 3.87–5.11)
RDW: 12.8 % (ref 11.5–15.5)
WBC: 7 10*3/uL (ref 4.0–10.5)
nRBC: 0 % (ref 0.0–0.2)

## 2021-06-02 LAB — URINALYSIS, ROUTINE W REFLEX MICROSCOPIC
Bilirubin Urine: NEGATIVE
Glucose, UA: NEGATIVE mg/dL
Hgb urine dipstick: NEGATIVE
Ketones, ur: NEGATIVE mg/dL
Leukocytes,Ua: NEGATIVE
Nitrite: NEGATIVE
Protein, ur: NEGATIVE mg/dL
Specific Gravity, Urine: 1.009 (ref 1.005–1.030)
pH: 9 — ABNORMAL HIGH (ref 5.0–8.0)

## 2021-06-02 LAB — COMPREHENSIVE METABOLIC PANEL
ALT: 307 U/L — ABNORMAL HIGH (ref 0–44)
AST: 277 U/L — ABNORMAL HIGH (ref 15–41)
Albumin: 3.2 g/dL — ABNORMAL LOW (ref 3.5–5.0)
Alkaline Phosphatase: 28 U/L — ABNORMAL LOW (ref 38–126)
Anion gap: 8 (ref 5–15)
BUN: 10 mg/dL (ref 8–23)
CO2: 29 mmol/L (ref 22–32)
Calcium: 8.8 mg/dL — ABNORMAL LOW (ref 8.9–10.3)
Chloride: 92 mmol/L — ABNORMAL LOW (ref 98–111)
Creatinine, Ser: 0.58 mg/dL (ref 0.44–1.00)
GFR, Estimated: >60 mL/min (ref >60)
Glucose, Bld: 105 mg/dL — ABNORMAL HIGH (ref 70–99)
Potassium: 3.5 mmol/L (ref 3.5–5.1)
Sodium: 129 mmol/L — ABNORMAL LOW (ref 135–145)
Total Bilirubin: 0.8 mg/dL (ref 0.3–1.2)
Total Protein: 6.2 g/dL — ABNORMAL LOW (ref 6.5–8.1)

## 2021-06-02 LAB — METHOTREXATE: Methotrexate: 0.09

## 2021-06-02 MED ORDER — LEUCOVORIN CALCIUM INJECTION 100 MG
15.0000 mg | Freq: Four times a day (QID) | INTRAMUSCULAR | Status: DC
  Administered 2021-06-02: 16 mg via INTRAVENOUS
  Filled 2021-06-02 (×8): qty 0.8

## 2021-06-02 MED ORDER — HEPARIN SOD (PORK) LOCK FLUSH 100 UNIT/ML IV SOLN
500.0000 [IU] | INTRAVENOUS | Status: DC | PRN
  Filled 2021-06-02: qty 5

## 2021-06-02 MED ORDER — DEXAMETHASONE 4 MG PO TABS: 2.0000 mg | ORAL_TABLET | Freq: Every day | ORAL | 3 refills | Status: DC

## 2021-06-02 MED ORDER — LEUCOVORIN CALCIUM 15 MG PO TABS: 15.0000 mg | ORAL_TABLET | Freq: Four times a day (QID) | ORAL | 1 refills | Status: DC

## 2021-06-02 NOTE — Discharge Summary (Signed)
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     Hospital Course: We alkalinized the urine to a pH >7 before giving MTX.  MTX and rituxiamb were administered without complication on hospital days 2 and 3.  Leucovorin rescue was administered during the admission starting on day 3, she received 10 IV treatments (16mg ).  Rituximab was given on hospital day #3. MTX level returned at below 0.10 on day of discharge  Decadron would be decreased to 2mg  daily at discharge.  Procedures Performed and pertinent labs: NM PET Image Initial (PI) Skull Base To Thigh  Result Date: 05/16/2021 CLINICAL DATA:  Initial treatment strategy for CNS lymphoma. EXAM: NUCLEAR MEDICINE PET SKULL BASE TO THIGH TECHNIQUE: 6.9 mCi F-18 FDG was injected intravenously. Full-ring PET imaging was performed from the skull base to thigh after the radiotracer. CT data was obtained and used for attenuation correction and anatomic localization. Fasting blood glucose: 91 mg/dl COMPARISON:  Head CT 04/28/2021. Chest abdomen pelvis CT 04/27/2021. FINDINGS: Mediastinal blood pool activity: SUV max 2.0 Liver activity: SUV max NA NECK: Images which included the lower brain show asymmetric hypermetabolism in the midline and left hemisphere. No hypermetabolic lymphadenopathy in the neck. Incidental CT findings: none CHEST: No hypermetabolic mediastinal or hilar nodes. No suspicious pulmonary nodules on the CT scan. Incidental CT findings: Right Port-A-Cath tip is positioned in the distal SVC. Basilar atelectasis noted bilaterally. ABDOMEN/PELVIS: No abnormal hypermetabolic activity within the  liver, pancreas, adrenal glands, or spleen. No hypermetabolic lymph nodes in the abdomen or pelvis. Incidental CT findings: Small scattered cysts noted. There is abdominal aortic atherosclerosis without aneurysm. SKELETON: No focal hypermetabolic activity to suggest skeletal metastasis. Incidental CT findings: none IMPRESSION: 1. No unexpected or suspicious hypermetabolism in the neck, chest, abdomen, or pelvis. Electronically Signed   By: Misty Stanley M.D.   On: 05/16/2021 11:41   MR TOTAL SPINE METS SCREENING  Result Date: 05/17/2021 CLINICAL DATA:  CNS lymphoma, staging EXAM: MRI TOTAL SPINE WITHOUT AND WITH CONTRAST TECHNIQUE: Multisequence MR imaging of the spine from the cervical spine to the sacrum was performed prior to and following IV contrast administration for evaluation of spinal metastatic disease. CONTRAST:  20mL GADAVIST GADOBUTROL 1 MMOL/ML IV SOLN COMPARISON:  None. FINDINGS: There is mild degenerative listhesis at cervical and lumbar levels. Vertebral body heights are maintained apart from degenerative endplate irregularity primarily at cervical and lower lumbar levels. There is no marrow edema. No suspicious osseous lesion. No abnormal cord signal on the large field-of-view images. No evidence of abnormal leptomeningeal enhancement in the spine. Partially imaged intracranial enhancing lesion. Multilevel degenerative changes are present at cervical and lumbar levels without high-grade canal narrowing. Cervical foramina are difficult to evaluate. There is lumbar neural foraminal narrowing at L4-L5 and L5-S1. IMPRESSION: No evidence of metastatic disease. Electronically Signed   By: Macy Mis M.D.   On: 05/17/2021 14:44   IR IMAGING GUIDED PORT INSERTION  Result Date: 05/12/2021 INDICATION: 70-year-old female with history of intracranial lymphoma requiring central venous access for chemotherapy. EXAM: IMPLANTED PORT A CATH PLACEMENT WITH ULTRASOUND AND FLUOROSCOPIC GUIDANCE COMPARISON:   None. MEDICATIONS: None. ANESTHESIA/SEDATION: Moderate (conscious) sedation was employed during this procedure.  A total of Versed 2 mg and Fentanyl 100 mcg was administered intravenously. Moderate Sedation Time: 18 minutes. The patient's level of consciousness and vital signs were monitored continuously by radiology nursing throughout the procedure under my direct supervision. CONTRAST:  None FLUOROSCOPY TIME:  0.1 minutes, (3.81 mGy) COMPLICATIONS: None immediate. PROCEDURE: The procedure, risks, benefits, and alternatives were explained to the patient. Questions regarding the procedure were encouraged and answered. The patient understands and consents to the procedure. The right neck and chest were prepped with chlorhexidine in a sterile fashion, and a sterile drape was applied covering the operative field. Maximum barrier sterile technique with sterile gowns and gloves were used for the procedure. A timeout was performed prior to the initiation of the procedure. Ultrasound was used to examine the jugular vein which was compressible and free of internal echoes. A skin marker was used to demarcate the planned venotomy and port pocket incision sites. Local anesthesia was provided to these sites and the subcutaneous tunnel track with 1% lidocaine with 1:100,000 epinephrine. A small incision was created at the jugular access site and blunt dissection was performed of the subcutaneous tissues. Under ultrasound guidance, the jugular vein was accessed with a 21 ga micropuncture needle and an 0.018" wire was inserted to the superior vena cava. Real-time ultrasound guidance was utilized for vascular access including the acquisition of a permanent ultrasound image documenting patency of the accessed vessel. A 5 Fr micopuncture set was then used, through which a 0.035" Rosen wire was passed under fluoroscopic guidance into the inferior vena cava. An 8 Fr dilator was then placed over the wire. A subcutaneous port pocket was  then created along the upper chest wall utilizing a combination of sharp and blunt dissection. The pocket was irrigated with sterile saline, packed with gauze, and observed for hemorrhage. A single lumen "ISP" sized power injectable port was chosen for placement. The 8 Fr catheter was tunneled from the port pocket site to the venotomy incision. The port was placed in the pocket. The external catheter was trimmed to appropriate length. The dilator was exchanged for an 8 Fr peel-away sheath under fluoroscopic guidance. The catheter was then placed through the sheath and the sheath was removed. Final catheter positioning was confirmed and documented with a fluoroscopic spot radiograph. The port was accessed with a Huber needle, aspirated, and flushed with heparinized saline. The deep dermal layer of the port pocket incision was closed with interrupted 3-0 Vicryl suture. Dermabond was then placed over the port pocket and neck incisions. The patient tolerated the procedure well without immediate post procedural complication. FINDINGS: After catheter placement, the tip lies within the superior cavoatrial junction. The catheter aspirates and flushes normally and is ready for immediate use. IMPRESSION: Successful placement of a power injectable Port-A-Cath via the right internal jugular vein. The catheter is ready for immediate use. Ruthann Cancer, MD [facility] Radiology Electronically Signed   By: Ruthann Cancer MD   On: 05/12/2021 09:19    Discharge Vitals & PE:  BP (!) 155/107 (BP Location: Left Arm)   Pulse 64   Temp 97.8 F (36.6 C) (Oral)   Resp 18   Ht 5\' 5"  (1.651 m)   SpO2 100%   BMI 20.12 kg/m   PHYSICAL EXAMINATION: Vitals:   06/01/21 2027 06/02/21 0449  BP: (!) 134/97 (!) 155/107  Pulse: 71 64  Resp: 16 18  Temp: 97.7 F (36.5 C) 97.8 F (36.6 C)  SpO2: 98% 100%  KPS: 60. General: Alert, cooperative, pleasant, in no acute distress Head:  Normal EENT: No conjunctival injection or scleral icterus. Lungs: Resp effort normal Cardiac: Regular rate Abdomen: Non-distended abdomen Skin: No rashes cyanosis or petechiae. Extremities: No clubbing or edema   Neurologic Exam: Mental Status: Awake, alert, attentive to examiner. Oriented to self and environment. Language is mildly impaired with regards to both fluency and comprehension.  Further mental status evaluation limited by dysphasia. Cranial Nerves: Visual acuity is grossly normal. Visual fields are full. Extra-ocular movements intact. No ptosis. Face is symmetric Motor: Tone and bulk are normal. Power is 4+/5 in right arm and leg. Reflexes are symmetric, no pathologic reflexes present. Sensory: Intact to light touch Gait: Dystaxic  Discharge Labs:  Results for orders placed or performed during the hospital encounter of 05/29/21 (from the past 24 hour(s))  CBC     Status: None   Collection Time: 06/02/21  5:19 AM  Result Value Ref Range   WBC 7.0 4.0 - 10.5 K/uL   RBC 4.45 3.87 - 5.11 MIL/uL   Hemoglobin 13.0 12.0 - 15.0 g/dL   HCT 38.3 36.0 - 46.0 %   MCV 86.1 80.0 - 100.0 fL   MCH 29.2 26.0 - 34.0 pg   MCHC 33.9 30.0 - 36.0 g/dL   RDW 12.8 11.5 - 15.5 %   Platelets 203 150 - 400 K/uL   nRBC 0.0 0.0 - 0.2 %  Comprehensive metabolic panel     Status: Abnormal   Collection Time: 06/02/21  5:19 AM  Result Value Ref Range   Sodium 129 (L) 135 - 145 mmol/L   Potassium 3.5 3.5 - 5.1 mmol/L   Chloride 92 (L) 98 - 111 mmol/L   CO2 29 22 - 32 mmol/L   Glucose, Bld 105 (H) 70 - 99 mg/dL   BUN 10 8 - 23 mg/dL   Creatinine, Ser 0.58 0.44 - 1.00 mg/dL   Calcium 8.8 (L) 8.9 - 10.3 mg/dL   Total Protein 6.2 (L) 6.5 - 8.1 g/dL   Albumin 3.2 (L) 3.5 - 5.0 g/dL   AST 277 (H) 15 - 41 U/L   ALT 307 (H) 0 - 44 U/L   Alkaline Phosphatase 28 (L) 38 - 126 U/L   Total Bilirubin 0.8 0.3 - 1.2 mg/dL   GFR, Estimated >60 >60 mL/min   Anion gap 8 5 - 15  Urinalysis, Routine w reflex  microscopic Urine, Clean Catch     Status: Abnormal   Collection Time: 06/02/21  5:32 AM  Result Value Ref Range   Color, Urine STRAW (A) YELLOW   APPearance CLEAR CLEAR   Specific Gravity, Urine 1.009 1.005 - 1.030   pH 9.0 (H) 5.0 - 8.0   Glucose, UA NEGATIVE NEGATIVE mg/dL   Hgb urine dipstick NEGATIVE NEGATIVE   Bilirubin Urine NEGATIVE NEGATIVE   Ketones, ur NEGATIVE NEGATIVE mg/dL   Protein, ur NEGATIVE NEGATIVE mg/dL   Nitrite NEGATIVE NEGATIVE   Leukocytes,Ua NEGATIVE NEGATIVE    Disposition and follow-up:   Audrey Barron was discharged from in stable condition.    Follow-up Appointments: Will f/u in [facility] on 6/24 at time TBD    Discharge Medications:  Allergies as of 06/02/2021   No Known Allergies      Medication List     STOP taking these medications    levETIRAcetam 500 MG tablet Commonly known as: Keppra       TAKE these medications    acetaminophen 500 MG  tablet Commonly known as: TYLENOL Take 500 mg by mouth at bedtime.   cyanocobalamin 1000 MCG tablet Take 1,000 mcg by mouth daily.   dexamethasone 4 MG tablet Commonly known as: DECADRON Take 0.5 tablets (2 mg total) by mouth daily. What changed: how much to take   diphenhydrAMINE 50 MG capsule Commonly known as: BENADRYL Take 1 capsule (50 mg total) by mouth at bedtime as needed for sleep.   pantoprazole 40 MG tablet Commonly known as: PROTONIX Take 1 tablet (40 mg total) by mouth at bedtime.   polyethylene glycol 17 g packet Commonly known as: MIRALAX / GLYCOLAX Take 17 g by mouth daily.         Medications Discontinued During This Encounter  Medication Reason   0.45 % sodium chloride infusion    1/4 NS with 100 mEq Sodium Bicarbonate Infusion Change in therapy   dexamethasone (DECADRON) 4 MG tablet Reorder      > 40 minutes time spent preparing d/c summary, including direct face-face patient Time, contact with consultants, family and care  coordination   Signed: Ventura Sellers 06/02/2021, 11:18 AM

## 2021-06-02 NOTE — Progress Notes (Signed)
Pt discharged home with daughter in stable condition. Discharge instructions given to daughter. Script sent to pharmacy of choice. No immediate questions or concerns. Daughter to call [facility] in the AM. Discharged from unit via wheelchair.

## 2021-06-02 NOTE — TOC Transition Note (Signed)
Transition of Care [facility]) - CM/SW Discharge Note   Patient Details  Name: Audrey Barron MRN: [id number] Date of Birth: 1951-08-12  [facility] of [facility] CM/SW Contact:  Leeroy Cha, RN Phone Number: 06/02/2021, 2:33 PM   Clinical Narrative:    Patient discharged to return to home with family.  No toc needs present at time of dc.   Final next level of care: Home/Self Care Barriers to Discharge: Barriers Resolved   Patient Goals and CMS Choice        Discharge Placement                       Discharge Plan and Services   Discharge Planning Services: CM Consult                        [facility] Agency: [place] ([facility]) Date HH Agency Contacted: 05/29/21 Time [place]: [place] Representative spoke with at [place]: [place] Determinants of Health ([place]) Interventions     Readmission Risk Interventions No flowsheet data found.

## 2021-06-03 ENCOUNTER — Encounter: Payer: Self-pay | Admitting: Internal Medicine

## 2021-06-05 ENCOUNTER — Telehealth: Payer: Self-pay | Admitting: *Deleted

## 2021-06-05 NOTE — Telephone Encounter (Signed)
Audrey Barron is asking if Audrey Barron needs to continue leucovorin at home. Was sent home "with a few pills to take 3 tablets, 4 times per day". Has run out and wants to know if she needs to get a refill. Also wants to know when she will be receiving next treatment.

## 2021-06-05 NOTE — Telephone Encounter (Signed)
Should discontinue the leucovorin.  We are tentatively planning next admission for 6/27, but will ask they return to [facility] clinic Friday 6/24 for labs and eval first.  Emails are already sent to relevant parties for both of these.   Notified of message above from Dr Mickeal Skinner

## 2021-06-06 ENCOUNTER — Other Ambulatory Visit: Payer: Self-pay | Admitting: *Deleted

## 2021-06-06 DIAGNOSIS — C8589 Other specified types of non-Hodgkin lymphoma, extranodal and solid organ sites: Secondary | ICD-10-CM

## 2021-06-07 ENCOUNTER — Other Ambulatory Visit: Payer: Self-pay

## 2021-06-07 ENCOUNTER — Inpatient Hospital Stay: Payer: Medicare HMO | Attending: Internal Medicine | Admitting: Hospice and Palliative Medicine

## 2021-06-07 DIAGNOSIS — C8519 Unspecified B-cell lymphoma, extranodal and solid organ sites: Secondary | ICD-10-CM | POA: Insufficient documentation

## 2021-06-07 DIAGNOSIS — C8589 Other specified types of non-Hodgkin lymphoma, extranodal and solid organ sites: Secondary | ICD-10-CM

## 2021-06-07 DIAGNOSIS — E222 Syndrome of inappropriate secretion of antidiuretic hormone: Secondary | ICD-10-CM | POA: Insufficient documentation

## 2021-06-07 NOTE — Progress Notes (Signed)
Multidisciplinary Oncology Council Documentation  Wynter Isaacs was presented by our [facility] on 06/07/2021, which included representatives from:  Palliative Care Dietitian  Physical/Occupational Therapist Nurse Navigator Genetics Speech Therapist Social work Survivorship RN Hotel manager Research RN [employer] currently presents with history of CNS lymphoma  We reviewed previous medical and familial history, history of present illness, and recent lab results along with all available histopathologic and imaging studies. The [place] considered available treatment options and made the following recommendations/referrals:  Nutrition   The MOC is a meeting of clinicians from various specialty areas who evaluate and discuss patients for whom a multidisciplinary approach is being considered. Final determinations in the plan of care are those of the provider(s).   Today's extended care, comprehensive team conference, [place] was not present for the discussion and was not examined.

## 2021-06-16 ENCOUNTER — Inpatient Hospital Stay: Payer: Medicare HMO

## 2021-06-16 ENCOUNTER — Inpatient Hospital Stay (HOSPITAL_BASED_OUTPATIENT_CLINIC_OR_DEPARTMENT_OTHER): Payer: Medicare HMO | Admitting: Internal Medicine

## 2021-06-16 ENCOUNTER — Encounter: Payer: Self-pay | Admitting: Internal Medicine

## 2021-06-16 ENCOUNTER — Other Ambulatory Visit (HOSPITAL_COMMUNITY)
Admission: RE | Admit: 2021-06-16 | Discharge: 2021-06-16 | Disposition: A | Discharge status: Home / Self Care | Payer: Medicare HMO | Source: Ambulatory Visit | Attending: Internal Medicine | Admitting: Internal Medicine

## 2021-06-16 VITALS — BP 116/84 | HR 83 | Temp 97.9°F | Resp 20 | Wt 126.9 lb

## 2021-06-16 DIAGNOSIS — C8589 Other specified types of non-Hodgkin lymphoma, extranodal and solid organ sites: Secondary | ICD-10-CM

## 2021-06-16 DIAGNOSIS — C8519 Unspecified B-cell lymphoma, extranodal and solid organ sites: Secondary | ICD-10-CM | POA: Diagnosis present

## 2021-06-16 DIAGNOSIS — E222 Syndrome of inappropriate secretion of antidiuretic hormone: Secondary | ICD-10-CM | POA: Diagnosis not present

## 2021-06-16 DIAGNOSIS — Z01812 Encounter for preprocedural laboratory examination: Secondary | ICD-10-CM | POA: Insufficient documentation

## 2021-06-16 DIAGNOSIS — Z20822 Contact with and (suspected) exposure to covid-19: Secondary | ICD-10-CM | POA: Insufficient documentation

## 2021-06-16 LAB — COMPREHENSIVE METABOLIC PANEL
ALT: 69 U/L — ABNORMAL HIGH (ref 0–44)
AST: 51 U/L — ABNORMAL HIGH (ref 15–41)
Albumin: 3.6 g/dL (ref 3.5–5.0)
Alkaline Phosphatase: 35 U/L — ABNORMAL LOW (ref 38–126)
Anion gap: 10 (ref 5–15)
BUN: 10 mg/dL (ref 8–23)
CO2: 23 mmol/L (ref 22–32)
Calcium: 8.9 mg/dL (ref 8.9–10.3)
Chloride: 91 mmol/L — ABNORMAL LOW (ref 98–111)
Creatinine, Ser: 0.53 mg/dL (ref 0.44–1.00)
GFR, Estimated: >60 mL/min (ref >60)
Glucose, Bld: 141 mg/dL — ABNORMAL HIGH (ref 70–99)
Potassium: 3.8 mmol/L (ref 3.5–5.1)
Sodium: 124 mmol/L — ABNORMAL LOW (ref 135–145)
Total Bilirubin: 1 mg/dL (ref 0.3–1.2)
Total Protein: 6.7 g/dL (ref 6.5–8.1)

## 2021-06-16 LAB — CBC WITH DIFFERENTIAL/PLATELET
Abs Immature Granulocytes: 0.34 10*3/uL — ABNORMAL HIGH (ref 0.00–0.07)
Basophils Absolute: 0.1 10*3/uL (ref 0.0–0.1)
Basophils Relative: 1 %
Eosinophils Absolute: 0 10*3/uL (ref 0.0–0.5)
Eosinophils Relative: 1 %
HCT: 39.5 % (ref 36.0–46.0)
Hemoglobin: 13.7 g/dL (ref 12.0–15.0)
Immature Granulocytes: 5 %
Lymphocytes Relative: 35 %
Lymphs Abs: 2.3 10*3/uL (ref 0.7–4.0)
MCH: 29.3 pg (ref 26.0–34.0)
MCHC: 34.7 g/dL (ref 30.0–36.0)
MCV: 84.4 fL (ref 80.0–100.0)
Monocytes Absolute: 0.5 10*3/uL (ref 0.1–1.0)
Monocytes Relative: 8 %
Neutro Abs: 3.3 10*3/uL (ref 1.7–7.7)
Neutrophils Relative %: 50 %
Platelets: 250 10*3/uL (ref 150–400)
RBC: 4.68 MIL/uL (ref 3.87–5.11)
RDW: 13.5 % (ref 11.5–15.5)
WBC: 6.6 10*3/uL (ref 4.0–10.5)
nRBC: 0 % (ref 0.0–0.2)

## 2021-06-16 LAB — SARS CORONAVIRUS 2 (TAT 6-24 HRS): SARS Coronavirus 2: NEGATIVE

## 2021-06-16 NOTE — Progress Notes (Signed)
[facility] [place], [place] [address] [phone]   Interval Evaluation  Date of Service: 06/16/21 Patient Name: Audrey Barron Patient MRN: [id number] Patient DOB: Sep 19, 1951 Provider: Ventura Sellers, MD  Identifying Statement:  Audrey Barron is a 70 y.o. female with  left hemispheric   Primary CNS Lymphoma    Oncologic History: Oncology History  Primary CNS lymphoma ([place])  04/28/2021 Surgery   Open biopsy with Dr. Izora Ribas; path demonstrates CNS B-cell lymphoma, CD20+   05/30/2021 -  Chemotherapy    Patient is on Treatment Plan: IP NON-HODGKINS LYMPHOMA HIGH DOSE METHOTREXATE + LEUCOVORIN RESCUE   Patient is on Antibody Plan: NON-HODGKINS LYMPHOMA RITUXIMAB Q21D     05/31/2021 -  Chemotherapy    Patient is on Treatment Plan: IP NON-HODGKINS LYMPHOMA HIGH DOSE METHOTREXATE + LEUCOVORIN RESCUE   Patient is on Antibody Plan: NON-HODGKINS LYMPHOMA RITUXIMAB Q21D       Biomarkers:  CD20 positive .  Bcl-6 positive  Ki-67 95%      Interval History:  Audrey Barron presents today for follow up after completing first cycle of high dose methotrexate and rituximab.  She and her daughter describe no changes in her weakness and language impairment.  No complications from chemotherapy that they report.  Denies seizures, headaches.  No new deficits are described.  Currently on decadron 81m daily.  H+P (05/05/21) Patient presented to medical attention in April, with 2-3 months history of confusion, impaired speech, balance and gait impairment.  Neurologist ordered an MRI, which demonstrated infiltrative tumor within much of left hemisphere.  She [facility] craniotomy and [facility] which yielded a B-cell CNS Lymphoma.  Following surgery, she has continued to experience difficulty speaking, weakness on the right side, and poor balance.  She is unable to walk without assistance at  home.  At present she is reliant on her daughter for help with dressing, cooking, tolieting; a majority of activities of daily living.  She has been dosing decadron 459mthree times per day; this has not clearly led to any improvements in functional status.   Medications: Current Outpatient Medications on File Prior to Visit  Medication Sig Dispense Refill   acetaminophen (TYLENOL) 500 MG tablet Take 500 mg by mouth at bedtime.     cyanocobalamin 1000 MCG tablet Take 1,000 mcg by mouth daily.     dexamethasone (DECADRON) 4 MG tablet Take 4 mg by mouth daily.     diphenhydrAMINE (BENADRYL) 50 MG capsule Take 1 capsule (50 mg total) by mouth at bedtime as needed for sleep. 30 capsule 0   pantoprazole (PROTONIX) 40 MG tablet Take 1 tablet (40 mg total) by mouth at bedtime. 30 tablet 2   No current facility-administered medications on file prior to visit.    Allergies: No Known Allergies Past Medical History:  Past Medical History:  Diagnosis Date   Cancer ([facility]   lymphoma   Dementia ([facility]   Past Surgical History:  Past Surgical History:  Procedure Laterality Date   APPLICATION OF CRANIAL NAVIGATION N/A 04/28/2021   Procedure: APPLICATION OF CRANIAL NAVIGATION;  Surgeon: YaMeade MawMD;  Location: [facility] ORS;  Service: Neurosurgery;  Laterality: N/A;   CRANIOTOMY Left 04/28/2021   Procedure: CRANIOTOMY TUMOR EXCISION;  Surgeon: YaMeade MawMD;  Location: [facility] ORS;  Service: Neurosurgery;  Laterality: Left;   IR IMAGING GUIDED PORT INSERTION  05/12/2021   Social History:  Social History  Socioeconomic History   Marital status: Single    Spouse name: Not on file   Number of children: Not on file   Years of education: Not on file   Highest education level: Not on file  Occupational History   Not on file  Tobacco Use   Smoking status: Never   Smokeless tobacco: Never  Substance and Sexual Activity   Alcohol use: Not Currently   Drug use: Not Currently   Sexual  activity: Not on file  Other Topics Concern   Not on file  Social History Narrative   Not on file   Social Determinants of Health   Financial Resource Strain: Not on file  Food Insecurity: Not on file  Transportation Needs: Not on file  Physical Activity: Not on file  Stress: Not on file  Social Connections: Not on file  Intimate Partner Violence: Not on file   Family History: History reviewed. No pertinent family history.  Review of Systems: Constitutional: Doesn't report fevers, chills or abnormal weight loss Eyes: Doesn't report blurriness of vision Ears, nose, mouth, throat, and face: Doesn't report sore throat Respiratory: Doesn't report cough, dyspnea or wheezes Cardiovascular: Doesn't report palpitation, chest discomfort  Gastrointestinal: +constipation GU: Doesn't report incontinence Skin: Doesn't report skin rashes Neurological: Per HPI Musculoskeletal: Doesn't report joint pain Behavioral/Psych: Doesn't report anxiety  Physical Exam: Vitals:   06/16/21 0932 06/16/21 0934  BP:  116/84  Pulse:  83  Resp: 20   Temp: 97.9 F (36.6 C)    KPS: 60. General: Alert, cooperative, pleasant, in no acute distress Head: Normal EENT: No conjunctival injection or scleral icterus.  Lungs: Resp effort normal Cardiac: Regular rate Abdomen: Non-distended abdomen Skin: No rashes cyanosis or petechiae. Extremities: No clubbing or edema  Neurologic Exam: Mental Status: Awake, alert, attentive to examiner. Oriented to self and environment. Language is mildly impaired with regards to both fluency and comprehension.  Further mental status evaluation limited by dysphasia. Cranial Nerves: Visual acuity is grossly normal. Visual fields are full. Extra-ocular movements intact. No ptosis. Face is symmetric Motor: Tone and bulk are normal. Power is 4+/5 in right arm and leg. Reflexes are symmetric, no pathologic reflexes present.  Sensory: Intact to light touch Gait:  Dystaxic   Labs: I have reviewed the data as listed    Component Value Date/Time   NA 124 (L) 06/16/2021 0914   K 3.8 06/16/2021 0914   CL 91 (L) 06/16/2021 0914   CO2 23 06/16/2021 0914   GLUCOSE 141 (H) 06/16/2021 0914   BUN 10 06/16/2021 0914   CREATININE 0.53 06/16/2021 0914   CALCIUM 8.9 06/16/2021 0914   PROT 6.7 06/16/2021 0914   ALBUMIN 3.6 06/16/2021 0914   AST 51 (H) 06/16/2021 0914   ALT 69 (H) 06/16/2021 0914   ALKPHOS 35 (L) 06/16/2021 0914   BILITOT 1.0 06/16/2021 0914   GFRNONAA >60 06/16/2021 0914   Lab Results  Component Value Date   WBC 6.6 06/16/2021   NEUTROABS 3.3 06/16/2021   HGB 13.7 06/16/2021   HCT 39.5 06/16/2021   MCV 84.4 06/16/2021   PLT 250 06/16/2021    Assessment/Plan Primary CNS lymphoma ([place]) [C85.89]  Kyerra Vargo is clinically stable today, now having completed first cycle of HD-MTX and rituximab.  We will plan to continue induction therapy with cycle #2 high dose methotrexate with rituximab, and temozolomide, as discussed with the patient.  The goal of therapy will be complete response.  We discussed the method of delivery which will  be inpatient administration for 3.5g/m2 MTX every 2-3 weeks for 6 cycles, with leucovorin rescue q6 hours after 24 hours.  Rituximab will be administered on day 3 of each cycle.    We recommended she con't decadron 30m once daily, will con't to decrease following next week's admission.  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  She will intake with Pedialyte over the weekend.  [facility]    [facility] initiation of cycle #2.  All questions were answered. The patient knows to call the clinic with any problems, questions or concerns. No barriers to learning were detected.  The total time spent in the encounter was 30 minutes and more than 50% was on counseling and review of test  results   ZVentura Sellers MD Medical Director of Neuro-Oncology [facility] [place]06/24/22 10:24 AM

## 2021-06-16 NOTE — Progress Notes (Signed)
Patient denies any concerns today.  

## 2021-06-19 ENCOUNTER — Inpatient Hospital Stay (HOSPITAL_COMMUNITY)
Admission: AD | Admit: 2021-06-19 | Discharge: 2021-06-24 | DRG: 847 | Disposition: A | Discharge status: Home / Self Care | Payer: Medicare HMO | Source: Ambulatory Visit | Attending: Internal Medicine | Admitting: Internal Medicine

## 2021-06-19 ENCOUNTER — Other Ambulatory Visit: Payer: Self-pay

## 2021-06-19 ENCOUNTER — Encounter (HOSPITAL_COMMUNITY): Payer: Self-pay | Admitting: Internal Medicine

## 2021-06-19 DIAGNOSIS — C8589 Other specified types of non-Hodgkin lymphoma, extranodal and solid organ sites: Secondary | ICD-10-CM

## 2021-06-19 DIAGNOSIS — Z20822 Contact with and (suspected) exposure to covid-19: Secondary | ICD-10-CM | POA: Diagnosis present

## 2021-06-19 DIAGNOSIS — Z8543 Personal history of malignant neoplasm of ovary: Secondary | ICD-10-CM

## 2021-06-19 DIAGNOSIS — E222 Syndrome of inappropriate secretion of antidiuretic hormone: Secondary | ICD-10-CM | POA: Diagnosis present

## 2021-06-19 DIAGNOSIS — F05 Delirium due to known physiological condition: Secondary | ICD-10-CM | POA: Diagnosis present

## 2021-06-19 DIAGNOSIS — E871 Hypo-osmolality and hyponatremia: Secondary | ICD-10-CM | POA: Diagnosis not present

## 2021-06-19 DIAGNOSIS — B182 Chronic viral hepatitis C: Secondary | ICD-10-CM | POA: Diagnosis present

## 2021-06-19 DIAGNOSIS — I10 Essential (primary) hypertension: Secondary | ICD-10-CM | POA: Diagnosis present

## 2021-06-19 DIAGNOSIS — E8770 Fluid overload, unspecified: Secondary | ICD-10-CM | POA: Diagnosis present

## 2021-06-19 DIAGNOSIS — E2749 Other adrenocortical insufficiency: Secondary | ICD-10-CM | POA: Diagnosis present

## 2021-06-19 DIAGNOSIS — R5381 Other malaise: Secondary | ICD-10-CM | POA: Diagnosis present

## 2021-06-19 DIAGNOSIS — F0390 Unspecified dementia without behavioral disturbance: Secondary | ICD-10-CM | POA: Diagnosis present

## 2021-06-19 DIAGNOSIS — Z5111 Encounter for antineoplastic chemotherapy: Secondary | ICD-10-CM | POA: Diagnosis not present

## 2021-06-19 DIAGNOSIS — T380X5A Adverse effect of glucocorticoids and synthetic analogues, initial encounter: Secondary | ICD-10-CM | POA: Diagnosis present

## 2021-06-19 DIAGNOSIS — R4182 Altered mental status, unspecified: Secondary | ICD-10-CM | POA: Diagnosis present

## 2021-06-19 DIAGNOSIS — C8339 Diffuse large B-cell lymphoma, extranodal and solid organ sites: Secondary | ICD-10-CM | POA: Diagnosis present

## 2021-06-19 LAB — URINALYSIS, ROUTINE W REFLEX MICROSCOPIC
Bilirubin Urine: NEGATIVE
Glucose, UA: NEGATIVE mg/dL
Hgb urine dipstick: NEGATIVE
Ketones, ur: NEGATIVE mg/dL
Leukocytes,Ua: NEGATIVE
Nitrite: NEGATIVE
Protein, ur: NEGATIVE mg/dL
Specific Gravity, Urine: 1.008 (ref 1.005–1.030)
pH: 8 (ref 5.0–8.0)

## 2021-06-19 LAB — COMPREHENSIVE METABOLIC PANEL
ALT: 72 U/L — ABNORMAL HIGH (ref 0–44)
AST: 48 U/L — ABNORMAL HIGH (ref 15–41)
Albumin: 3.4 g/dL — ABNORMAL LOW (ref 3.5–5.0)
Alkaline Phosphatase: 32 U/L — ABNORMAL LOW (ref 38–126)
Anion gap: 6 (ref 5–15)
BUN: 8 mg/dL (ref 8–23)
CO2: 25 mmol/L (ref 22–32)
Calcium: 8.7 mg/dL — ABNORMAL LOW (ref 8.9–10.3)
Chloride: 94 mmol/L — ABNORMAL LOW (ref 98–111)
Creatinine, Ser: 0.54 mg/dL (ref 0.44–1.00)
GFR, Estimated: >60 mL/min (ref >60)
Glucose, Bld: 109 mg/dL — ABNORMAL HIGH (ref 70–99)
Potassium: 3.8 mmol/L (ref 3.5–5.1)
Sodium: 125 mmol/L — ABNORMAL LOW (ref 135–145)
Total Bilirubin: 0.8 mg/dL (ref 0.3–1.2)
Total Protein: 6.3 g/dL — ABNORMAL LOW (ref 6.5–8.1)

## 2021-06-19 LAB — OSMOLALITY, URINE: Osmolality, Ur: 288 mOsm/kg — ABNORMAL LOW (ref 300–900)

## 2021-06-19 LAB — MAGNESIUM: Magnesium: 1.8 mg/dL (ref 1.7–2.4)

## 2021-06-19 LAB — OSMOLALITY: Osmolality: 263 mOsm/kg — ABNORMAL LOW (ref 275–295)

## 2021-06-19 LAB — NA AND K (SODIUM & POTASSIUM), RAND UR
Potassium Urine: 21 mmol/L
Sodium, Ur: 81 mmol/L

## 2021-06-19 LAB — PHOSPHORUS: Phosphorus: 3.1 mg/dL (ref 2.5–4.6)

## 2021-06-19 MED ORDER — DEXAMETHASONE 4 MG PO TABS
4.0000 mg | ORAL_TABLET | Freq: Every day | ORAL | Status: DC
  Administered 2021-06-20 – 2021-06-24 (×5): 4 mg via ORAL
  Filled 2021-06-19 (×5): qty 1

## 2021-06-19 MED ORDER — ACETAMINOPHEN 325 MG PO TABS: 650.0000 mg | ORAL_TABLET | ORAL | Status: DC | PRN

## 2021-06-19 MED ORDER — AMLODIPINE BESYLATE 5 MG PO TABS
2.5000 mg | ORAL_TABLET | Freq: Every day | ORAL | Status: DC
  Administered 2021-06-19 – 2021-06-24 (×6): 2.5 mg via ORAL
  Filled 2021-06-19 (×6): qty 1

## 2021-06-19 MED ORDER — DIPHENHYDRAMINE HCL 50 MG PO CAPS: 50.0000 mg | ORAL_CAPSULE | Freq: Every evening | ORAL | Status: DC | PRN

## 2021-06-19 MED ORDER — SENNOSIDES-DOCUSATE SODIUM 8.6-50 MG PO TABS: 1.0000 | ORAL_TABLET | Freq: Every evening | ORAL | Status: DC | PRN

## 2021-06-19 MED ORDER — VITAMIN B-12 1000 MCG PO TABS
1000.0000 ug | ORAL_TABLET | Freq: Every day | ORAL | Status: DC
  Administered 2021-06-20 – 2021-06-24 (×5): 1000 ug via ORAL
  Filled 2021-06-19 (×5): qty 1

## 2021-06-19 MED ORDER — ONDANSETRON 4 MG PO TBDP: 4.0000 mg | ORAL_TABLET | Freq: Three times a day (TID) | ORAL | Status: DC | PRN

## 2021-06-19 MED ORDER — ACETAMINOPHEN 500 MG PO TABS
500.0000 mg | ORAL_TABLET | Freq: Every day | ORAL | Status: DC
  Administered 2021-06-19 – 2021-06-23 (×5): 500 mg via ORAL
  Filled 2021-06-19 (×5): qty 1

## 2021-06-19 MED ORDER — PANTOPRAZOLE SODIUM 40 MG PO TBEC: 40.0000 mg | DELAYED_RELEASE_TABLET | Freq: Every day | ORAL | Status: DC

## 2021-06-19 MED ORDER — ONDANSETRON HCL 4 MG/2ML IJ SOLN: 4.0000 mg | Freq: Three times a day (TID) | INTRAMUSCULAR | Status: DC | PRN

## 2021-06-19 MED ORDER — ONDANSETRON HCL 40 MG/20ML IJ SOLN
8.0000 mg | Freq: Three times a day (TID) | INTRAMUSCULAR | Status: DC | PRN
  Filled 2021-06-19: qty 4

## 2021-06-19 MED ORDER — ONDANSETRON HCL 4 MG PO TABS: 4.0000 mg | ORAL_TABLET | Freq: Three times a day (TID) | ORAL | Status: DC | PRN

## 2021-06-19 MED ORDER — ENOXAPARIN SODIUM 40 MG/0.4ML IJ SOSY
40.0000 mg | PREFILLED_SYRINGE | Freq: Every day | INTRAMUSCULAR | Status: DC
  Administered 2021-06-19 – 2021-06-24 (×6): 40 mg via SUBCUTANEOUS
  Filled 2021-06-19 (×6): qty 0.4

## 2021-06-19 NOTE — Progress Notes (Signed)
Patients BP has shown 140/101 and 135/100. This RN has tried to contact oncologist Dr. Mickeal Skinner & hospitalist Dr. Cherylann Ratel. Have not received a response or any orders.

## 2021-06-19 NOTE — Consult Note (Signed)
Medical Consultation  Audrey Barron JSH:[id number] DOB: 1951/03/10 DOA: 06/19/2021 PCP: Marguerita Merles, MD   Requesting physician: Dr. Mickeal Skinner Date of consultation: 06/19/21 Reason for consultation: Hyponatremia  Impression/Recommendations Hyponatremia     - admitted to onco service     - Na+ is 125; earlier in the month she was 132-135     - apparently had similar episode w/ [facility] and was diagnosed with SIADH and given tolvaptan per onco     - onco needs to alkalinize her urine to administer her chemo; this will require some dilute fluids apparently     - let's make sure of her Dx first; urine studies pending     - right now, let's fluid restrict; should SIADH be proven correct, then will need nephro onboard to help w/ fluid modification and VRAs  HTN     - I don't see any BP meds on her home med list     - diastolic elevations     - can start low dose norvasc and titrate  Remainder per primary team.  TRH will follow-up again tomorrow. Please contact me if I can be of assistance in the meanwhile. Thank you for this consultation.  Chief Complaint: hyponatremia  HPI:  Audrey Barron is a 70 y.o. female with medical history significant of CNS lymphoma, dementia. Presenting for initiation of induction therapy for CNS lymphoma. History is from chart review as the patient is pleasantly confused. She apparently had several months of gait disturbance, confusion and speech difficulties. She had a work up that should an infiltrative tumor in the left hemisphere of her brain. 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 She continues to have difficulty w/ gait and speech. Her steroids have not improved her functionality. It was determined that she trial MTX and rituximab therapy at this point.  Lab work was obtained prior to her treatment. She was found to be hyponatremic. Given the fluids needed to alkalinize her urine, TRH was called for recommendations.   Review of Systems:   Denies CP, dyspnea, palpitations, ab pain, N/V/D. Remainder of ROS is negative for all not mentioned in HPI.  Past Medical History:  Diagnosis Date   Cancer ([place])    lymphoma   Dementia [facility])    Past Surgical History:  Procedure Laterality Date   APPLICATION OF CRANIAL NAVIGATION N/A 04/28/2021   Procedure: APPLICATION OF CRANIAL NAVIGATION;  Surgeon: Meade Maw, MD;  Location: [facility] ORS;  Service: Neurosurgery;  Laterality: N/A;   CRANIOTOMY Left 04/28/2021   Procedure: CRANIOTOMY TUMOR EXCISION;  Surgeon: Meade Maw, MD;  Location: [facility] ORS;  Service: Neurosurgery;  Laterality: Left;   IR IMAGING GUIDED PORT INSERTION  05/12/2021   Social History:  reports that she has never smoked. She has never used smokeless tobacco. She reports previous alcohol use. She reports previous drug use.  No Known Allergies History reviewed. No pertinent family history.  Prior to Admission medications   Medication Sig Start Date End Date Taking? Authorizing Provider  acetaminophen (TYLENOL) 500 MG tablet Take 500 mg by mouth at bedtime.   Yes [provider]  cyanocobalamin 1000 MCG tablet Take 1,000 mcg by mouth daily.   Yes [provider]  dexamethasone (DECADRON) 4 MG tablet Take 4 mg by mouth daily.   Yes [provider]  diphenhydrAMINE (BENADRYL) 50 MG capsule Take 1 capsule (50 mg total) by mouth at bedtime as needed for sleep. 05/02/21  Yes Sharen Hones, MD  pantoprazole (PROTONIX) 40 MG  tablet Take 1 tablet (40 mg total) by mouth at bedtime. 05/24/21  Yes Vaslow, Acey Lav, MD   Physical Exam: Blood pressure (!) 141/96, pulse 81, temperature 99.1 F (37.3 C), temperature source Oral, resp. rate 16, SpO2 100 %. Vitals:   06/19/21 1024  BP: (!) 141/96  Pulse: 81  Resp: 16  Temp: 99.1 F (37.3 C)  SpO2: 100%    General: 70 y.o. female resting in bed in NAD Cardiovascular: RRR, +S1, S2, no m/g/r, equal pulses throughout Respiratory: CTABL, no w/r/r,  normal WOB GI: BS+, NDNT, no masses noted, no organomegaly noted MSK: No e/c/c Skin: No rashes, bruises, ulcerations noted Neuro: A&O x name, year, follows commands Psyc: pleasantly confused calm/cooperative  Labs on Admission:  Basic Metabolic Panel: Recent Labs  Lab 06/16/21 0914 06/19/21 1049  NA 124* 125*  K 3.8 3.8  CL 91* 94*  CO2 23 25  GLUCOSE 141* 109*  BUN 10 8  CREATININE 0.53 0.54  CALCIUM 8.9 8.7*  MG  --  1.8  PHOS  --  3.1   Liver Function Tests: Recent Labs  Lab 06/16/21 0914 06/19/21 1049  AST 51* 48*  ALT 69* 72*  ALKPHOS 35* 32*  BILITOT 1.0 0.8  PROT 6.7 6.3*  ALBUMIN 3.6 3.4*   No results for input(s): LIPASE, AMYLASE in the last 168 hours. No results for input(s): AMMONIA in the last 168 hours. CBC: Recent Labs  Lab 06/16/21 0914  WBC 6.6  NEUTROABS 3.3  HGB 13.7  HCT 39.5  MCV 84.4  PLT 250   Cardiac Enzymes: No results for input(s): CKTOTAL, CKMB, CKMBINDEX, TROPONINI in the last 168 hours. BNP: Invalid input(s): POCBNP CBG: No results for input(s): GLUCAP in the last 168 hours.  Radiological Exams on Admission: No results found.  EKG: None obtained  Time spent: 30 minutes  [facility]  If 7PM-7AM, please contact night-coverage [email or web address] 06/19/2021, 1:57 PM

## 2021-06-19 NOTE — H&P (Signed)
[facility] [place], [place] [address] [phone]   Interval Evaluation  Date of Service: 05/19/21 Patient Name: Audrey Barron Patient MRN: [id number] Patient DOB: 08-May-1951 Provider: Ventura Sellers, MD  Identifying Statement:  Audrey Barron is a 70 y.o. female with  left hemispheric   Primary CNS Lymphoma    Oncologic History: Oncology History  Primary CNS lymphoma ([place])  04/28/2021 Surgery   Open biopsy with Dr. Izora Ribas; path demonstrates CNS B-cell lymphoma, CD20+   05/30/2021 -  Chemotherapy    Patient is on Treatment Plan: IP NON-HODGKINS LYMPHOMA HIGH DOSE METHOTREXATE + LEUCOVORIN RESCUE   Patient is on Antibody Plan: NON-HODGKINS LYMPHOMA RITUXIMAB Q21D       Biomarkers:  CD20 positive .  Bcl-6 positive  Ki-67 95%      Interval History:  Audrey Barron presents today for cycle #2 of initiation of induction therapy with methotrexate and rituximab.  Labs are pending for review after our discussion and lab review last week. No new complaints prior to admission. Denies seizures, headaches.  No new deficits are described.    H+P (05/05/21) Patient presented to medical attention in April, with 2-3 months history of confusion, impaired speech, balance and gait impairment.  Neurologist ordered an MRI, which demonstrated infiltrative tumor within much of left hemisphere.  She underwent craniotomy and open biopsy on 04/28/21 with Dr. Izora Ribas at [facility], which yielded a B-cell CNS Lymphoma.  Following surgery, she has continued to experience difficulty speaking, weakness on the right side, and poor balance.  She is unable to walk without assistance at home.  At present she is reliant on her daughter for help with dressing, cooking, tolieting; a majority of activities of daily living.  She has been dosing decadron 62m three times per day; this has not clearly led to any improvements in functional status.    Medications: No current facility-administered medications on file prior to encounter.   Current Outpatient Medications on File Prior to Encounter  Medication Sig Dispense Refill   acetaminophen (TYLENOL) 500 MG tablet Take 500 mg by mouth at bedtime.     cyanocobalamin 1000 MCG tablet Take 1,000 mcg by mouth daily.     diphenhydrAMINE (BENADRYL) 50 MG capsule Take 1 capsule (50 mg total) by mouth at bedtime as needed for sleep. 30 capsule 0   pantoprazole (PROTONIX) 40 MG tablet Take 1 tablet (40 mg total) by mouth at bedtime. 30 tablet 2    Allergies: No Known Allergies Past Medical History:  Past Medical History:  Diagnosis Date   [place]    lymphoma   D[place]    Past Surgical History:  Past Surgical History:  Procedure Laterality Date   APPLICATION OF CRANIAL NAVIGATION N/A 04/28/2021   Procedure: APPLICATION OF CRANIAL NAVIGATION;  Surgeon: YMeade Maw MD;  Location: [facility] ORS;  Service: Neurosurgery;  Laterality: N/A;   CRANIOTOMY Left 04/28/2021   Procedure: CRANIOTOMY TUMOR EXCISION;  Surgeon: YMeade Maw MD;  Location: [facility] ORS;  Service: Neurosurgery;  Laterality: Left;   IR IMAGING GUIDED PORT INSERTION  05/12/2021   Social History:  Social History   Socioeconomic History   Marital status: Single    Spouse name: Not on file   Number of children: Not on file   Years of education: Not on file   Highest education level: Not on file  Occupational History   Not on file  Tobacco Use   Smoking status: Never  Smokeless tobacco: Never  Substance and Sexual Activity   Alcohol use: Not Currently   Drug use: Not Currently   Sexual activity: Not on file  Other Topics Concern   Not on file  Social History Narrative   Not on file   Social Determinants of Health   Financial Resource Strain: Not on file  Food Insecurity: Not on file  Transportation Needs: Not on file  Physical Activity: Not on file  Stress: Not on file  Social Connections:  Not on file  Intimate Partner Violence: Not on file   Family History: History reviewed. No pertinent family history.  Review of Systems: Constitutional: Doesn't report fevers, chills or abnormal weight loss Eyes: Doesn't report blurriness of vision Ears, nose, mouth, throat, and face: Doesn't report sore throat Respiratory: Doesn't report cough, dyspnea or wheezes Cardiovascular: Doesn't report palpitation, chest discomfort  Gastrointestinal: +constipation GU: Doesn't report incontinence Skin: Doesn't report skin rashes Neurological: Per HPI Musculoskeletal: Doesn't report joint pain Behavioral/Psych: Doesn't report anxiety  Physical Exam: Vitals:   06/19/21 1024  BP: (!) 141/96  Pulse: 81  Resp: 16  Temp: 99.1 F (37.3 C)  SpO2: 100%   KPS: 60. General: Alert, cooperative, pleasant, in no acute distress Head: Normal EENT: No conjunctival injection or scleral icterus.  Lungs: Resp effort normal Cardiac: Regular rate Abdomen: Non-distended abdomen Skin: No rashes cyanosis or petechiae. Extremities: No clubbing or edema  Neurologic Exam: Mental Status: Awake, alert, attentive to examiner. Oriented to self and environment. Language is mildly impaired with regards to both fluency and comprehension.  Further mental status evaluation limited by dysphasia. Cranial Nerves: Visual acuity is grossly normal. Visual fields are full. Extra-ocular movements intact. No ptosis. Face is symmetric Motor: Tone and bulk are normal. Power is 4+/5 in right arm and leg. Reflexes are symmetric, no pathologic reflexes present.  Sensory: Intact to light touch Gait: Dystaxic   Labs: I have reviewed the data as listed    Component Value Date/Time   NA 125 (L) 06/19/2021 1049   K 3.8 06/19/2021 1049   CL 94 (L) 06/19/2021 1049   CO2 25 06/19/2021 1049   GLUCOSE 109 (H) 06/19/2021 1049   BUN 8 06/19/2021 1049   CREATININE 0.54 06/19/2021 1049   CALCIUM 8.7 (L) 06/19/2021 1049   PROT 6.3  (L) 06/19/2021 1049   ALBUMIN 3.4 (L) 06/19/2021 1049   AST 48 (H) 06/19/2021 1049   ALT 72 (H) 06/19/2021 1049   ALKPHOS 32 (L) 06/19/2021 1049   BILITOT 0.8 06/19/2021 1049   GFRNONAA >60 06/19/2021 1049   Lab Results  Component Value Date   WBC 6.6 06/16/2021   NEUTROABS 3.3 06/16/2021   HGB 13.7 06/16/2021   HCT 39.5 06/16/2021   MCV 84.4 06/16/2021   PLT 250 06/16/2021   Pathology: SURGICAL PATHOLOGY  CASE: [id number]  PATIENT: Junita Push  Surgical Pathology Report   Specimen Submitted:  A. Temporal lesion, left   Clinical History: Left brain mass   DIAGNOSIS:  A. BRAIN, LEFT TEMPORAL LOBE; CRANIOTOMY WITH BIOPSY:  - HIGH GRADE B-CELL LYMPHOMA, COMPATIBLE WITH PRIMARY CNS LYMPHOMA.   Comment:  Biopsy sections display neural tissue predominantly replaced by an  abnormal proliferation of intermediate to large lymphocytes.  These  lymphocytes display vesicular chromatin, prominent nucleoli, high N:C  ratio, and abnormal nuclear contours.  Apoptotic debris is abundant.  In  addition, there are patchy areas of tumor necrosis.   Immunohistochemical studies show this abnormal proliferation to be  comprised of  a large CD20+ B cells, with relatively few CD3+ T cells.  Abnormal B cells are positive for BCL-6 and MUM-1, and negative for  BCL-2.  CD10 shows diffuse dim blush like staining, the significance of  which is unclear.  Cyclin-D1 and CD30 are negative. c-myc is positive,  marking 50-60% of abnormal B cells.  Ki-67 is significantly elevated,  with positivity in greater than 95% of abnormal B-cells.  An  Epstein-Barr virus encoded RNA (EBER) ISH probe is negative.   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  Although CD10 positivity within this entity would  be rare, the absence of radiographic evidence of peripheral adenopathy  or other  potential sites of involvement would favor a primary CNS  lymphoma.  There is sufficient tissue present for ancillary molecular  testing if desired.    Assessment/Plan Primary CNS lymphoma ([place]) [C85.89]  We appreciate the opportunity to participate in the care of [facility].  She again presents with clinical, radiographic, histologic syndrome consistent with Primary B-cell CNS Lymphoma, with no evidence of leptomeningeal or ocular spread.    Hyponatremia: -Patient appears euvolemic, will check urine lytes, urine and serum osmoles.  Post craniotomy/biopsy (5/6) she had SIADH picture. -Appreciate medicine consult for management -For now will hold off on fluids and await word from medicine.   CNS Lymphoma: -once urine alkalinized and labs reviewed, appropriate to proceed with C2 of high dose MTX with leucovorin rescue inpatient -MTX-R therapy C2D1 likely tomorrow 06/20/21: Methotrexate 3.5g/m2 given over 4 hours once urine pH >= 7.0 -Leucovorin 63m q6 hours 24 hours after MTX infusion   -Rituxan can be given 24-48 hours after MTX infusion -To start once electrolytes stabilized: IV fluids, 1/5NS with 1023m sodium bicarbonate at 12578mr.  Need to adjust rate to urine pH >= 7 -daily labs with cbc, cmp -daily urine pH once it reaches target pH of >7 -daily MTX levels following MTX infusion until MTX levels <0.10 -continue same supportive medications with exception of protonix which interferes with clearance of MTX  -continue decadron 4mg62mily -LFTs and renal function both normal at this time   HCV serum screening antibody test and HCV RNA were positive.  Rituximab component of treatment regimen may need to be altered if viral load increases or patient develops elevated liver enzymes.   Impact of chronic HCV infection, if demonstrated, may not affect mortality but greatly increase risk of treatment related hepatotoxicity: Daisuke Ennishi, et al. Hepatic toxicity and prognosis in  hepatitis C virus-infected patients with diffuse large B-cell lymphoma treated with rituximab-containing chemotherapy regimens: a [facility] analysis. Blood 2010; 116 (24): [id number] questions were answered. The patient knows to call the clinic with any problems, questions or concerns. No barriers to learning were detected.  The total time spent in the encounter was 60 minutes and more than 50% was on counseling and review of test results   ZachVentura Sellers Medical Director of Neuro-Oncology [facility] CenterWeslDuncannon27/22 11:49 AM

## 2021-06-20 LAB — URINALYSIS, ROUTINE W REFLEX MICROSCOPIC
Bilirubin Urine: NEGATIVE
Glucose, UA: NEGATIVE mg/dL
Hgb urine dipstick: NEGATIVE
Ketones, ur: NEGATIVE mg/dL
Leukocytes,Ua: NEGATIVE
Nitrite: NEGATIVE
Protein, ur: NEGATIVE mg/dL
Specific Gravity, Urine: 1.01 (ref 1.005–1.030)
pH: 7 (ref 5.0–8.0)

## 2021-06-20 LAB — COMPREHENSIVE METABOLIC PANEL
ALT: 67 U/L — ABNORMAL HIGH (ref 0–44)
AST: 40 U/L (ref 15–41)
Albumin: 3.6 g/dL (ref 3.5–5.0)
Alkaline Phosphatase: 32 U/L — ABNORMAL LOW (ref 38–126)
Anion gap: 9 (ref 5–15)
BUN: 11 mg/dL (ref 8–23)
CO2: 26 mmol/L (ref 22–32)
Calcium: 9.1 mg/dL (ref 8.9–10.3)
Chloride: 91 mmol/L — ABNORMAL LOW (ref 98–111)
Creatinine, Ser: 0.58 mg/dL (ref 0.44–1.00)
GFR, Estimated: >60 mL/min (ref >60)
Glucose, Bld: 76 mg/dL (ref 70–99)
Potassium: 4.2 mmol/L (ref 3.5–5.1)
Sodium: 126 mmol/L — ABNORMAL LOW (ref 135–145)
Total Bilirubin: 1 mg/dL (ref 0.3–1.2)
Total Protein: 6.4 g/dL — ABNORMAL LOW (ref 6.5–8.1)

## 2021-06-20 LAB — CBC WITH DIFFERENTIAL/PLATELET
Abs Immature Granulocytes: 0.43 10*3/uL — ABNORMAL HIGH (ref 0.00–0.07)
Basophils Absolute: 0.1 10*3/uL (ref 0.0–0.1)
Basophils Relative: 1 %
Eosinophils Absolute: 0 10*3/uL (ref 0.0–0.5)
Eosinophils Relative: 0 %
HCT: 39.9 % (ref 36.0–46.0)
Hemoglobin: 13.6 g/dL (ref 12.0–15.0)
Immature Granulocytes: 6 %
Lymphocytes Relative: 36 %
Lymphs Abs: 2.5 10*3/uL (ref 0.7–4.0)
MCH: 28.9 pg (ref 26.0–34.0)
MCHC: 34.1 g/dL (ref 30.0–36.0)
MCV: 84.9 fL (ref 80.0–100.0)
Monocytes Absolute: 0.9 10*3/uL (ref 0.1–1.0)
Monocytes Relative: 13 %
Neutro Abs: 3.1 10*3/uL (ref 1.7–7.7)
Neutrophils Relative %: 44 %
Platelets: 277 10*3/uL (ref 150–400)
RBC: 4.7 MIL/uL (ref 3.87–5.11)
RDW: 13.7 % (ref 11.5–15.5)
WBC: 7.1 10*3/uL (ref 4.0–10.5)
nRBC: 0 % (ref 0.0–0.2)

## 2021-06-20 LAB — CORTISOL-AM, BLOOD: Cortisol - AM: <0.4 ug/dL — ABNORMAL LOW (ref 6.7–22.6)

## 2021-06-20 LAB — TSH: TSH: 1.199 u[IU]/mL (ref 0.350–4.500)

## 2021-06-20 MED ORDER — CHLORHEXIDINE GLUCONATE CLOTH 2 % EX PADS
6.0000 | MEDICATED_PAD | Freq: Every day | CUTANEOUS | Status: DC
  Administered 2021-06-20 – 2021-06-24 (×5): 6 via TOPICAL

## 2021-06-20 MED ORDER — SODIUM CHLORIDE 1 G PO TABS
1.0000 g | ORAL_TABLET | Freq: Two times a day (BID) | ORAL | Status: AC
  Administered 2021-06-20 (×2): 1 g via ORAL
  Filled 2021-06-20 (×2): qty 1

## 2021-06-20 NOTE — Progress Notes (Signed)
Audrey Barron at [place] Audrey Barron, [place] [address] [phone]   Interval Evaluation  Date of Service: 05/19/21 Patient Name: Audrey Barron Patient MRN: [id number] Patient DOB: Oct 16, 1951 Provider: Ventura Sellers, MD  Identifying Statement:  Audrey Barron is a 70 y.o. female with  left hemispheric   Primary CNS Lymphoma    Oncologic History: Oncology History  Primary CNS lymphoma ([place])  04/28/2021 Surgery   Open biopsy with Dr. Izora Barron; path demonstrates CNS B-cell lymphoma, CD20+   05/30/2021 -  Chemotherapy    Patient is on Treatment Plan: IP NON-HODGKINS LYMPHOMA HIGH DOSE METHOTREXATE + LEUCOVORIN RESCUE   Patient is on Antibody Plan: NON-HODGKINS LYMPHOMA RITUXIMAB Q21D       Biomarkers:  CD20 positive .  Bcl-6 positive  Ki-67 95%      Interval History:  Audrey Barron has no complaints today.  IV fluids have not been initiated due to sodium levels.  Denies seizures, headaches.  No new deficits are described.    H+P (05/05/21) Patient presented to medical attention in April, with 2-3 months history of confusion, impaired speech, balance and gait impairment.  Neurologist ordered an MRI, which demonstrated infiltrative tumor within much of left hemisphere.  She underwent craniotomy and open biopsy on 04/28/21 with Dr. Izora Barron at [facility], which yielded a B-cell CNS Lymphoma.  Following surgery, she has continued to experience difficulty speaking, weakness on the right side, and poor balance.  She is unable to walk without assistance at home.  At present she is reliant on her daughter for help with dressing, cooking, tolieting; a majority of activities of daily living.  She has been dosing decadron 54m three times per day; this has not clearly led to any improvements in functional status.   Medications: No current facility-administered medications on file prior to encounter.   Current Outpatient Medications on File  Prior to Encounter  Medication Sig Dispense Refill   acetaminophen (TYLENOL) 500 MG tablet Take 500 mg by mouth at bedtime.     cyanocobalamin 1000 MCG tablet Take 1,000 mcg by mouth daily.     diphenhydrAMINE (BENADRYL) 50 MG capsule Take 1 capsule (50 mg total) by mouth at bedtime as needed for sleep. 30 capsule 0   pantoprazole (PROTONIX) 40 MG tablet Take 1 tablet (40 mg total) by mouth at bedtime. 30 tablet 2    Allergies: No Known Allergies Past Medical History:  Past Medical History:  Diagnosis Date   Cancer ([place]    lymphoma   Dementia ([place]    Past Surgical History:  Past Surgical History:  Procedure Laterality Date   APPLICATION OF CRANIAL NAVIGATION N/A 04/28/2021   Procedure: APPLICATION OF CRANIAL NAVIGATION;  Surgeon: Audrey Maw MD;  Location: [facility] ORS;  Service: Neurosurgery;  Laterality: N/A;   CRANIOTOMY Left 04/28/2021   Procedure: CRANIOTOMY TUMOR EXCISION;  Surgeon: Audrey Maw MD;  Location: [facility] ORS;  Service: Neurosurgery;  Laterality: Left;   IR IMAGING GUIDED PORT INSERTION  05/12/2021   Social History:  Social History   Socioeconomic History   Marital status: Single    Spouse name: Not on file   Number of children: Not on file   Years of education: Not on file   Highest education level: Not on file  Occupational History   Not on file  Tobacco Use   Smoking status: Never   Smokeless tobacco: Never  Substance and Sexual Activity   Alcohol use: Not Currently   Drug use:  Not Currently   Sexual activity: Not on file  Other Topics Concern   Not on file  Social History Narrative   Not on file   Social Determinants of Health   Financial Resource Strain: Not on file  Food Insecurity: Not on file  Transportation Needs: Not on file  Physical Activity: Not on file  Stress: Not on file  Social Connections: Not on file  Intimate Partner Violence: Not on file   Family History: History reviewed. No pertinent family history.  Review of  Systems: Constitutional: Doesn't report fevers, chills or abnormal weight loss Eyes: Doesn't report blurriness of vision Ears, nose, mouth, throat, and face: Doesn't report sore throat Respiratory: Doesn't report cough, dyspnea or wheezes Cardiovascular: Doesn't report palpitation, chest discomfort  Gastrointestinal: +constipation GU: Doesn't report incontinence Skin: Doesn't report skin rashes Neurological: Per HPI Musculoskeletal: Doesn't report joint pain Behavioral/Psych: Doesn't report anxiety  Physical Exam: Vitals:   06/19/21 2230 06/20/21 0443  BP: (!) 128/100 (!) 145/109  Pulse: 68 68  Resp: 16 17  Temp: 98 F (36.7 C) 98.2 F (36.8 C)  SpO2: 99% 98%   KPS: 60. General: Alert, cooperative, pleasant, in no acute distress Head: Normal EENT: No conjunctival injection or scleral icterus.  Lungs: Resp effort normal Cardiac: Regular rate Abdomen: Non-distended abdomen Skin: No rashes cyanosis or petechiae. Extremities: No clubbing or edema  Neurologic Exam: Mental Status: Awake, alert, attentive to examiner. Oriented to self and environment. Language is mildly impaired with regards to both fluency and comprehension.  Further mental status evaluation limited by dysphasia. Cranial Nerves: Visual acuity is grossly normal. Visual fields are full. Extra-ocular movements intact. No ptosis. Face is symmetric Motor: Tone and bulk are normal. Power is 4+/5 in right arm and leg. Reflexes are symmetric, no pathologic reflexes present.  Sensory: Intact to light touch Gait: Dystaxic   Labs: I have reviewed the data as listed    Component Value Date/Time   NA 126 (L) 06/20/2021 0555   K 4.2 06/20/2021 0555   CL 91 (L) 06/20/2021 0555   CO2 26 06/20/2021 0555   GLUCOSE 76 06/20/2021 0555   BUN 11 06/20/2021 0555   CREATININE 0.58 06/20/2021 0555   CALCIUM 9.1 06/20/2021 0555   PROT 6.4 (L) 06/20/2021 0555   ALBUMIN 3.6 06/20/2021 0555   AST 40 06/20/2021 0555   ALT 67 (H)  06/20/2021 0555   ALKPHOS 32 (L) 06/20/2021 0555   BILITOT 1.0 06/20/2021 0555   GFRNONAA >60 06/20/2021 0555   Lab Results  Component Value Date   WBC 7.1 06/20/2021   NEUTROABS 3.1 06/20/2021   HGB 13.6 06/20/2021   HCT 39.9 06/20/2021   MCV 84.9 06/20/2021   PLT 277 06/20/2021   Pathology: SURGICAL PATHOLOGY  CASE: [id number]  PATIENT: Junita Push  Surgical Pathology Report   Specimen Submitted:  A. Temporal lesion, left   Clinical History: Left brain mass   DIAGNOSIS:  A. BRAIN, LEFT TEMPORAL LOBE; CRANIOTOMY WITH BIOPSY:  - HIGH GRADE B-CELL LYMPHOMA, COMPATIBLE WITH PRIMARY CNS LYMPHOMA.   Comment:  Biopsy sections display neural tissue predominantly replaced by an  abnormal proliferation of intermediate to large lymphocytes.  These  lymphocytes display vesicular chromatin, prominent nucleoli, high N:C  ratio, and abnormal nuclear contours.  Apoptotic debris is abundant.  In  addition, there are patchy areas of tumor necrosis.   Immunohistochemical studies show this abnormal proliferation to be  comprised of a large CD20+ B cells, with relatively few CD3+ T cells.  Abnormal B cells are positive for BCL-6 and MUM-1, and negative for  BCL-2.  CD10 shows diffuse dim blush like staining, the significance of  which is unclear.  Cyclin-D1 and CD30 are negative. c-myc is positive,  marking 50-60% of abnormal B cells.  Ki-67 is significantly elevated,  with positivity in greater than 95% of abnormal B-cells.  An  Epstein-Barr virus encoded RNA (EBER) ISH probe is negative.   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  Although CD10 positivity within this entity would  be rare, the absence of radiographic evidence of peripheral adenopathy  or other potential sites of involvement would favor a primary CNS  lymphoma.  There is sufficient tissue  present for ancillary molecular  testing if desired.    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  She again presents with clinical, radiographic, histologic syndrome consistent with Primary B-cell CNS Lymphoma, with no evidence of leptomeningeal or ocular spread.    Hyponatremia: -Appreciate guidance from hospitalist team -Picture consistent with SIADH -BID salt tabs, fluid restriction for now.  T/C adding lasix pending labs tmrw.  CNS Lymphoma: -If Na+ not in compatible range tomorrow (at least 130) we will cancel methotrexate infusion and administer Rituximab monotherapy instead.  Once electrolyte issues are stabilized and treated, we can plan re-admission for next cycle of HD-MTX.  Will re-eval in AM pending labs, communicate with pharmacy team.   All questions were answered. The patient knows to call the clinic with any problems, questions or concerns. No barriers to learning were detected.  The total time spent in the encounter was 30 minutes and more than 50% was on counseling and review of test results   Audrey Sellers, MD Medical Director of Neuro-Oncology [facility] at [place] 05/19/21 11:29 AM

## 2021-06-20 NOTE — Progress Notes (Addendum)
[facility]  PROGRESS NOTE  Audrey Barron GYJ:[id number] DOB: [birth year] DOA: 06/19/2021 PCP: Marguerita Merles, MD   Brief HPI:   70 year old female with history of CNS lymphoma, dementia presented for initiation of induction therapy for CNS lymphoma.  She apparently had several months of gait disturbance, confusion and speech difficulty.  Work-up showed infiltrative tumor in the left hemisphere of her brain.  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  She was put on Decadron and discharged home.  Lab work obtained yesterday showed hyponatremia with sodium 125.  Hospitalist team was consulted for management of hyponatremia.    Subjective   Patient seen and examined, denies any complaints.  Sodium is still 126.   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 Jonnie Finner -We will start salt tablets 1 g p.o. twice daily for 2 doses; if sodium improving consider continuing with salt tablets.  Stop salt tablets if sodium reaches in 130s. -Continue fluid restriction 1200 cc/day -She may need low-dose Lasix 10 mg daily if no improvement with salt tablets -Follow BMP in am   Secondary adrenal insufficiency -Cortisol level less than 0.4 -Likely suppression from Decadron -Patient was taking Decadron 2 mg daily at home -We will obtain ACTH level  Hypertension -Blood pressure is stable -Continue amlodipine 2.5 mg daily -May need to uptitrate the dose if blood pressure not controlled with this dose  CNS lymphoma -Oncology following  Scheduled medications:    acetaminophen  500 mg Oral QHS   amLODipine  2.5 mg Oral Daily   Chlorhexidine Gluconate Cloth  6 each Topical Daily   dexamethasone  4 mg Oral Daily   enoxaparin (LOVENOX) injection  40 mg Subcutaneous Daily   sodium chloride  1 g Oral BID WC   cyanocobalamin  1,000 mcg Oral Daily         Data Reviewed:    CBG:  No results for input(s): GLUCAP in the last 168 hours.  SpO2: 95 %    Vitals:   06/19/21 1831 06/19/21 2230 06/20/21 0443 06/20/21 1337  BP: (!) 136/101 (!) 128/100 (!) 145/109 123/89  Pulse: 71 68 68 88  Resp: 14 16 17 15   Temp: 98.6 F (37 C) 98 F (36.7 C) 98.2 F (36.8 C) 98.3 F (36.8 C)  TempSrc:  Oral Oral Oral  SpO2: 100% 99% 98% 95%     Intake/Output Summary (Last 24 hours) at 06/20/2021 1546 Last data filed at 06/19/2021 1700 Gross per 24 hour  Intake --  Output 1400 ml  Net -1400 ml    06/26 1901 - 06/28 0700 In: -  Out: 1400 [Urine:1400]  There were no vitals filed for this visit.  CBC:  Recent Labs  Lab 06/16/21 0914 06/20/21 0555  WBC 6.6 7.1  HGB 13.7 13.6  HCT 39.5 39.9  PLT 250 277  MCV 84.4 84.9  MCH 29.3 28.9  MCHC 34.7 34.1  RDW 13.5 13.7  LYMPHSABS 2.3 2.5  MONOABS 0.5 0.9  EOSABS 0.0 0.0  BASOSABS 0.1 0.1    Complete metabolic panel:  Recent Labs  Lab 06/16/21 0914 06/19/21 1049 06/20/21 0555  NA 124* 125* 126*  K 3.8 3.8 4.2  CL 91* 94* 91*  CO2 23 25 26   GLUCOSE 141* 109* 76  BUN 10 8 11   CREATININE 0.53 0.54 0.58  CALCIUM 8.9 8.7* 9.1  AST 51* 48* 40  ALT 69* 72* 67*  ALKPHOS  35* 32* 32*  BILITOT 1.0 0.8 1.0  ALBUMIN 3.6 3.4* 3.6  MG  --  1.8  --   TSH  --   --  1.199    No results for input(s): LIPASE, AMYLASE in the last 168 hours.  Recent Labs  Lab 06/16/21 1220  [place]    ------------------------------------------------------------------------------------------------------------------ No results for input(s): CHOL, HDL, LDLCALC, TRIG, CHOLHDL, LDLDIRECT in the last 72 hours.  No results found for: HGBA1C ------------------------------------------------------------------------------------------------------------------ Recent Labs    06/20/21 0555  TSH 1.199    ------------------------------------------------------------------------------------------------------------------ No results for input(s): VITAMINB12, FOLATE, FERRITIN, TIBC, IRON, RETICCTPCT in the last 72 hours.  Coagulation profile No results for input(s): INR, PROTIME in the last 168 hours. No results for input(s): DDIMER in the last 72 hours.  Cardiac Enzymes No results for input(s): CKTOTAL, CKMB, CKMBINDEX, TROPONINI in the last 168 hours.  ------------------------------------------------------------------------------------------------------------------ No results found for: BNP   Antibiotics: Anti-infectives (From admission, onward)    None        Radiology Reports  No results found.      Objective    Physical Examination:   General-appears in no acute distress Heart-S1-S2, regular, no murmur auscultated Lungs-clear to auscultation bilaterally, no wheezing or crackles auscultated Abdomen-soft, nontender, no organomegaly Extremities-no edema in the lower extremities Neuro-alert, oriented x3, no focal deficit noted     COVID-19 Labs  No results for input(s): DDIMER, FERRITIN, LDH, CRP in the last 72 hours.  Lab Results  Component Value Date   [place] NEGATIVE 06/16/2021   [place] NEGATIVE 05/26/2021   [place] NEGATIVE 04/26/2021    Microbiology  Recent Results (from the past 240 hour(s))  SARS CORONAVIRUS 2 (TAT 6-24 HRS) Nasopharyngeal Nasopharyngeal Swab     Status: None   Collection Time: 06/16/21 12:20 PM   Specimen: Nasopharyngeal Swab  Result Value Ref Range Status   SARS Coronavirus 2 NEGATIVE NEGATIVE Final    Comment: (NOTE) SARS-CoV-2 target nucleic acids are NOT DETECTED.  The SARS-CoV-2 RNA is generally detectable in upper and lower respiratory specimens during the acute phase of infection. Negative results do not preclude SARS-CoV-2 infection, do not rule out co-infections with other pathogens, and should not  be used as the sole basis for treatment or other patient management decisions. Negative results must be combined with clinical observations, patient history, and epidemiological information. The expected result is Negative.  Fact Sheet for Patients: SugarRoll.be  Fact Sheet for Healthcare Providers: https://www.woods-mathews.com/  This test is not yet approved or cleared by the [place] FDA and  has been authorized for detection and/or diagnosis of SARS-CoV-2 by FDA under an Emergency Use Authorization (EUA). This EUA will remain  in effect (meaning this test can be used) for the duration of the COVID-19 declaration under Se ction 564(b)(1) of the Act, 21 U.S.C. section 360bbb-3(b)(1), unless the authorization is terminated or revoked sooner.  Performed at [facility], [place] [address] [place], [place] [address]              [facility] If 7PM-7AM, please contact night-coverage at [email or web address], Office  [phone]   06/20/2021, 3:46 PM  LOS: 1 day

## 2021-06-21 DIAGNOSIS — C8589 Other specified types of non-Hodgkin lymphoma, extranodal and solid organ sites: Secondary | ICD-10-CM

## 2021-06-21 LAB — CBC WITH DIFFERENTIAL/PLATELET
Abs Immature Granulocytes: 0.38 10*3/uL — ABNORMAL HIGH (ref 0.00–0.07)
Basophils Absolute: 0 10*3/uL (ref 0.0–0.1)
Basophils Relative: 1 %
Eosinophils Absolute: 0 10*3/uL (ref 0.0–0.5)
Eosinophils Relative: 0 %
HCT: 41.5 % (ref 36.0–46.0)
Hemoglobin: 14.3 g/dL (ref 12.0–15.0)
Immature Granulocytes: 5 %
Lymphocytes Relative: 30 %
Lymphs Abs: 2.2 10*3/uL (ref 0.7–4.0)
MCH: 29.3 pg (ref 26.0–34.0)
MCHC: 34.5 g/dL (ref 30.0–36.0)
MCV: 85 fL (ref 80.0–100.0)
Monocytes Absolute: 0.8 10*3/uL (ref 0.1–1.0)
Monocytes Relative: 11 %
Neutro Abs: 3.9 10*3/uL (ref 1.7–7.7)
Neutrophils Relative %: 53 %
Platelets: 282 10*3/uL (ref 150–400)
RBC: 4.88 MIL/uL (ref 3.87–5.11)
RDW: 13.5 % (ref 11.5–15.5)
WBC: 7.4 10*3/uL (ref 4.0–10.5)
nRBC: 0 % (ref 0.0–0.2)

## 2021-06-21 LAB — URINALYSIS, ROUTINE W REFLEX MICROSCOPIC
Bilirubin Urine: NEGATIVE
Glucose, UA: NEGATIVE mg/dL
Hgb urine dipstick: NEGATIVE
Ketones, ur: NEGATIVE mg/dL
Nitrite: NEGATIVE
Protein, ur: NEGATIVE mg/dL
Specific Gravity, Urine: 1.02 (ref 1.005–1.030)
pH: 6 (ref 5.0–8.0)

## 2021-06-21 LAB — COMPREHENSIVE METABOLIC PANEL
ALT: 67 U/L — ABNORMAL HIGH (ref 0–44)
AST: 41 U/L (ref 15–41)
Albumin: 3.6 g/dL (ref 3.5–5.0)
Alkaline Phosphatase: 45 U/L (ref 38–126)
Anion gap: 9 (ref 5–15)
BUN: 15 mg/dL (ref 8–23)
CO2: 26 mmol/L (ref 22–32)
Calcium: 9.2 mg/dL (ref 8.9–10.3)
Chloride: 90 mmol/L — ABNORMAL LOW (ref 98–111)
Creatinine, Ser: 0.53 mg/dL (ref 0.44–1.00)
GFR, Estimated: >60 mL/min (ref >60)
Glucose, Bld: 91 mg/dL (ref 70–99)
Potassium: 4.5 mmol/L (ref 3.5–5.1)
Sodium: 125 mmol/L — ABNORMAL LOW (ref 135–145)
Total Bilirubin: 0.7 mg/dL (ref 0.3–1.2)
Total Protein: 6.5 g/dL (ref 6.5–8.1)

## 2021-06-21 LAB — ACTH: C206 ACTH: 2.3 pg/mL — ABNORMAL LOW (ref 7.2–63.3)

## 2021-06-21 LAB — METHOTREXATE: Methotrexate: <0.05

## 2021-06-21 MED ORDER — DIPHENHYDRAMINE HCL 50 MG PO CAPS
50.0000 mg | ORAL_CAPSULE | Freq: Once | ORAL | Status: AC
  Administered 2021-06-21: 50 mg via ORAL
  Filled 2021-06-21: qty 1

## 2021-06-21 MED ORDER — SODIUM CHLORIDE 1 G PO TABS
1.0000 g | ORAL_TABLET | Freq: Two times a day (BID) | ORAL | Status: DC
  Administered 2021-06-21 (×2): 1 g via ORAL
  Filled 2021-06-21 (×4): qty 1

## 2021-06-21 MED ORDER — FUROSEMIDE 20 MG PO TABS
20.0000 mg | ORAL_TABLET | Freq: Every day | ORAL | Status: DC
  Administered 2021-06-21: 20 mg via ORAL
  Filled 2021-06-21: qty 1

## 2021-06-21 MED ORDER — SODIUM CHLORIDE 0.9 % IV SOLN
375.0000 mg/m2 | Freq: Once | INTRAVENOUS | Status: AC
  Administered 2021-06-21: 600 mg via INTRAVENOUS
  Filled 2021-06-21: qty 50

## 2021-06-21 MED ORDER — ACETAMINOPHEN 325 MG PO TABS
650.0000 mg | ORAL_TABLET | Freq: Once | ORAL | Status: AC
  Administered 2021-06-21: 650 mg via ORAL
  Filled 2021-06-21: qty 2

## 2021-06-21 MED ORDER — SODIUM CHLORIDE 0.9 % IV SOLN: Freq: Once | INTRAVENOUS | Status: AC

## 2021-06-21 NOTE — Progress Notes (Signed)
Rituximab dosage and calculations verified with Brayton Layman, Therapist, sports. Consent verified, in pt's chart.

## 2021-06-21 NOTE — Progress Notes (Signed)
[facility] consult note                                                                              Patient Demographics  Audrey Barron, is a 70 y.o. female, DOB - 06-May-1951, [place] date - 06/19/2021   Admitting Physician Ventura Sellers, MD  Outpatient Primary MD for the patient is Marguerita Merles, MD  Outpatient specialists:   LOS - 2  days   Medical records reviewed and are as summarized below:    No chief complaint on file.      Brief summary   70 year old female with history of CNS lymphoma, dementia presented for initiation of induction therapy for CNS lymphoma.  She apparently had several months of gait disturbance, confusion and speech difficulty.  Work-up showed infiltrative tumor in the left hemisphere of her brain.  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  She was put on Decadron and discharged home.   BMET on admission showed hyponatremia with sodium of 125.  TRH was consulted for hyponatremia.  Patient was admitted from oncology office, Dr. 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  I will continue salt tablets and added Lasix 20 mg daily, continue fluid restriction -Follow BMET  Active Problems: Secondary adrenal insufficiency -Cortisol level less than 0.4, likely suppression from Decadron    Primary CNS lymphoma ([place]) -Management per oncology, Dr Mickeal Skinner    Generalized debility - will order PT OT evaluation   Code Status: Full CODE STATUS DVT Prophylaxis:  enoxaparin (LOVENOX) injection 40 mg Start: 06/19/21 1200   Level of Care: Level of care: Med-Surg Family Communication: Discussed all imaging results, lab results, explained to the patient   Disposition Plan:     Status  is: Inpatient  Remains inpatient appropriate because:Inpatient level of care appropriate due to severity of illness  Dispo: The patient is from: Home              Anticipated d/c is to: Home              Patient currently is not medically stable to d/c.   Difficult to place patient No      Time Spent in minutes   25 minutes  Procedures:  None  Antimicrobials:   Anti-infectives (From admission, onward)    None          Medications  Scheduled Meds:  acetaminophen  500 mg Oral QHS   acetaminophen  650 mg Oral Once   amLODipine  2.5 mg Oral Daily   Chlorhexidine Gluconate Cloth  6 each Topical Daily   dexamethasone  4 mg Oral Daily   diphenhydrAMINE  50 mg Oral Once   enoxaparin (LOVENOX) injection  40 mg Subcutaneous Daily   furosemide  20 mg Oral Daily   riTUXimab-pvvr (RUXIENCE) IV infusion  375 mg/m2 (Treatment Plan Recorded) Intravenous Once   sodium chloride  1 g Oral BID WC   cyanocobalamin  1,000 mcg Oral Daily   Continuous Infusions:  sodium chloride     ondansetron (ZOFRAN) IV     PRN Meds:.acetaminophen, diphenhydrAMINE, ondansetron **OR** ondansetron **OR** ondansetron (ZOFRAN) IV **OR** ondansetron (ZOFRAN) IV, senna-docusate      Subjective:   [name] was seen and examined today.  Overall no complaints.no blurry vision, seizures or headaches. Patient denies dizziness, chest pain, shortness of breath, abdominal pain, N/V/D/C, new weakness, numbess, tingling. No acute events overnight.    Objective:   Vitals:   06/20/21 1337 06/20/21 2026 06/20/21 2120 06/21/21 0554  BP: 123/89 117/80  (!) 120/98  Pulse: 88 80  81  Resp: 15 16  16   Temp: 98.3 F (36.8 C) 97.9 F (36.6 C)  97.8 F (36.6 C)  TempSrc: Oral Oral  Oral  SpO2: 95% 97%  96%  Weight:    57.3 kg  Height:   5\' 5"  (1.651 m)     Intake/Output Summary (Last 24 hours) at 06/21/2021 1327 Last data filed at 06/21/2021 1100 Gross per 24 hour  Intake 840 ml  Output 600 ml   Net 240 ml     Wt Readings from Last 3 Encounters:  06/21/21 57.3 kg  06/16/21 57.6 kg  05/19/21 54.8 kg     Exam General: Alert and oriented x 3, NAD, dysphasia Cardiovascular: S1 S2 auscultated, no murmurs, RRR Respiratory: Clear to auscultation bilaterally, no wheezing, rales or rhonchi Gastrointestinal: Soft, nontender, nondistended, + bowel sounds Ext: no pedal edema bilaterally Neuro: strength 4/5 in LUE, RLE.  5/5 on the left side  Psych: Normal affect and demeanor, alert and oriented x3    Data Reviewed:  I have personally reviewed following labs and imaging studies  Micro Results Recent Results (from the past 240 hour(s))  SARS CORONAVIRUS 2 (TAT 6-24 HRS) Nasopharyngeal Nasopharyngeal Swab     Status: None   Collection Time: 06/16/21 12:20 PM   Specimen: Nasopharyngeal Swab  Result Value Ref Range Status   SARS Coronavirus 2 NEGATIVE NEGATIVE Final    Comment: (NOTE) SARS-CoV-2 target nucleic acids are NOT DETECTED.  The SARS-CoV-2 RNA is generally detectable in upper and lower respiratory specimens during the acute phase of infection. Negative results do not preclude SARS-CoV-2 infection, do not rule out co-infections with other pathogens, and should not be used as the sole basis for treatment or other patient management decisions. Negative results must be combined with clinical observations, patient history, and epidemiological information. The expected result is Negative.  Fact Sheet for Patients: SugarRoll.be  Fact Sheet for Healthcare Providers: https://www.woods-mathews.com/  This test is not yet approved or cleared by the [place] FDA and  has been authorized for detection and/or diagnosis of SARS-CoV-2 by FDA under an Emergency Use Authorization (EUA). This EUA will remain  in effect (meaning this test can be used) for the duration of the COVID-19 declaration under Se ction 564(b)(1) of the Act, 21  U.S.C. section 360bbb-3(b)(1), unless the authorization is terminated or revoked sooner.  Performed at [facility], [place] [address] [place], [place] [address]     Radiology Reports No results found.  Lab Data:  CBC: Recent Labs  Lab 06/16/21 0914 06/20/21 0555 06/21/21 0535  WBC 6.6 7.1 7.4  NEUTROABS 3.3 3.1 3.9  HGB 13.7 13.6 14.3  HCT 39.5 39.9 41.5  MCV 84.4 84.9 85.0  PLT 250 277 270   Basic Metabolic Panel: Recent Labs  Lab 06/16/21 0914  06/19/21 1049 06/20/21 0555 06/21/21 0535  NA 124* 125* 126* 125*  K 3.8 3.8 4.2 4.5  CL 91* 94* 91* 90*  CO2 23 25 26 26   GLUCOSE 141* 109* 76 91  BUN 10 8 11 15   CREATININE 0.53 0.54 0.58 0.53  CALCIUM 8.9 8.7* 9.1 9.2  MG  --  1.8  --   --   PHOS  --  3.1  --   --    GFR: Estimated Creatinine Clearance: 58.9 mL/min (by C-G formula based on SCr of 0.53 mg/dL). Liver Function Tests: Recent Labs  Lab 06/16/21 0914 06/19/21 1049 06/20/21 0555 06/21/21 0535  AST 51* 48* 40 41  ALT 69* 72* 67* 67*  ALKPHOS 35* 32* 32* 45  BILITOT 1.0 0.8 1.0 0.7  PROT 6.7 6.3* 6.4* 6.5  ALBUMIN 3.6 3.4* 3.6 3.6   No results for input(s): LIPASE, AMYLASE in the last 168 hours. No results for input(s): AMMONIA in the last 168 hours. Coagulation Profile: No results for input(s): INR, PROTIME in the last 168 hours. Cardiac Enzymes: No results for input(s): CKTOTAL, CKMB, CKMBINDEX, TROPONINI in the last 168 hours. BNP (last 3 results) No results for input(s): PROBNP in the last 8760 hours. HbA1C: No results for input(s): HGBA1C in the last 72 hours. CBG: No results for input(s): GLUCAP in the last 168 hours. Lipid Profile: No results for input(s): CHOL, HDL, LDLCALC, TRIG, CHOLHDL, LDLDIRECT in the last 72 hours. Thyroid Function Tests: Recent Labs    06/20/21 0555  TSH 1.199   Anemia Panel: No results for input(s): VITAMINB12, FOLATE, FERRITIN, TIBC, IRON, RETICCTPCT in the last 72 hours. Urine analysis:     Component Value Date/Time   COLORURINE YELLOW 06/21/2021 0837   APPEARANCEUR HAZY (A) 06/21/2021 0837   LABSPEC 1.020 06/21/2021 0837   PHURINE 6.0 06/21/2021 [place] 06/21/2021 0837   HGBUR NEGATIVE 06/21/2021 [place] 06/21/2021 [place] 06/21/2021 [place] 06/21/2021 0837   NITRITE NEGATIVE 06/21/2021 0837   LEUKOCYTESUR SMALL (A) 06/21/2021 0837     Zyiah Withington M.D. [facility] 06/21/2021, 1:27 PM  Available via Epic secure chat 7am-7pm After 7 pm, please refer to night coverage provider listed on amion.

## 2021-06-21 NOTE — Progress Notes (Signed)
[facility] [place], [place] [address] [phone]   Interval Evaluation  Date of Service: 05/19/21 Patient Name: Audrey Barron Patient MRN: [id number] Patient DOB: [birth year] Provider: Ventura Sellers, MD  Identifying Statement:  Audrey Barron is a 70 y.o. female with  left hemispheric   Primary CNS Lymphoma    Oncologic History: Oncology History  Primary CNS lymphoma ([place])  04/28/2021 Surgery   Open biopsy with Dr. Izora Ribas; path demonstrates CNS B-cell lymphoma, CD20+   05/30/2021 -  Chemotherapy    Patient is on Treatment Plan: IP NON-HODGKINS LYMPHOMA HIGH DOSE METHOTREXATE + LEUCOVORIN RESCUE   Patient is on Antibody Plan: NON-HODGKINS LYMPHOMA RITUXIMAB Q21D       Biomarkers:  CD20 positive .  Bcl-6 positive  Ki-67 95%      Interval History:  Audrey Barron denies any new complaints today.  Understands MTX and subsequent fluid load not safe to administer due to electrolyte issues, she is able to receive rituximab.  Denies seizures, headaches.     H+P (05/05/21) Patient presented to medical attention in April, with 2-3 months history of confusion, impaired speech, balance and gait impairment.  Neurologist ordered an MRI, which demonstrated infiltrative tumor within much of left hemisphere.  She underwent craniotomy and open biopsy on 04/28/21 with Dr. Izora Ribas at [facility], which yielded a B-cell CNS Lymphoma.  Following surgery, she has continued to experience difficulty speaking, weakness on the right side, and poor balance.  She is unable to walk without assistance at home.  At present she is reliant on her daughter for help with dressing, cooking, tolieting; a majority of activities of daily living.  She has been dosing decadron $RemoveBeforeD'4mg'[id number]$  three times per day; this has not clearly led to any improvements in functional status.   Medications: No current facility-administered medications on file prior to  encounter.   Current Outpatient Medications on File Prior to Encounter  Medication Sig Dispense Refill   acetaminophen (TYLENOL) 500 MG tablet Take 500 mg by mouth at bedtime.     cyanocobalamin 1000 MCG tablet Take 1,000 mcg by mouth daily.     diphenhydrAMINE (BENADRYL) 50 MG capsule Take 1 capsule (50 mg total) by mouth at bedtime as needed for sleep. 30 capsule 0   pantoprazole (PROTONIX) 40 MG tablet Take 1 tablet (40 mg total) by mouth at bedtime. 30 tablet 2    Allergies: No Known Allergies Past Medical History:  Past Medical History:  Diagnosis Date   Cancer ([place])    lymphoma   Dementia ([place])    Past Surgical History:  Past Surgical History:  Procedure Laterality Date   APPLICATION OF CRANIAL NAVIGATION N/A 04/28/2021   Procedure: APPLICATION OF CRANIAL NAVIGATION;  Surgeon: Meade Maw, MD;  Location: [facility] ORS;  Service: Neurosurgery;  Laterality: N/A;   CRANIOTOMY Left 04/28/2021   Procedure: CRANIOTOMY TUMOR EXCISION;  Surgeon: Meade Maw, MD;  Location: [facility] ORS;  Service: Neurosurgery;  Laterality: Left;   IR IMAGING GUIDED PORT INSERTION  05/12/2021   Social History:  Social History   Socioeconomic History   Marital status: Single    Spouse name: Not on file   Number of children: Not on file   Years of education: Not on file   Highest education level: Not on file  Occupational History   Not on file  Tobacco Use   Smoking status: Never   Smokeless tobacco: Never  Substance and Sexual Activity   Alcohol use:  Not Currently   Drug use: Not Currently   Sexual activity: Not on file  Other Topics Concern   Not on file  Social History Narrative   Not on file   Social Determinants of Health   Financial Resource Strain: Not on file  Food Insecurity: Not on file  Transportation Needs: Not on file  Physical Activity: Not on file  Stress: Not on file  Social Connections: Not on file  Intimate Partner Violence: Not on file   Family History:  History reviewed. No pertinent family history.  Review of Systems: Constitutional: Doesn't report fevers, chills or abnormal weight loss Eyes: Doesn't report blurriness of vision Ears, nose, mouth, throat, and face: Doesn't report sore throat Respiratory: Doesn't report cough, dyspnea or wheezes Cardiovascular: Doesn't report palpitation, chest discomfort  Gastrointestinal: +constipation GU: Doesn't report incontinence Skin: Doesn't report skin rashes Neurological: Per HPI Musculoskeletal: Doesn't report joint pain Behavioral/Psych: Doesn't report anxiety  Physical Exam: Vitals:   06/20/21 2026 06/21/21 0554  BP: 117/80 (!) 120/98  Pulse: 80 81  Resp: 16 16  Temp: 97.9 F (36.6 C) 97.8 F (36.6 C)  SpO2: 97% 96%   KPS: 60. General: Alert, cooperative, pleasant, in no acute distress Head: Normal EENT: No conjunctival injection or scleral icterus.  Lungs: Resp effort normal Cardiac: Regular rate Abdomen: Non-distended abdomen Skin: No rashes cyanosis or petechiae. Extremities: No clubbing or edema  Neurologic Exam: Mental Status: Awake, alert, attentive to examiner. Oriented to self and environment. Language is mildly impaired with regards to both fluency and comprehension.  Further mental status evaluation limited by dysphasia. Cranial Nerves: Visual acuity is grossly normal. Visual fields are full. Extra-ocular movements intact. No ptosis. Face is symmetric Motor: Tone and bulk are normal. Power is 4+/5 in right arm and leg. Reflexes are symmetric, no pathologic reflexes present.  Sensory: Intact to light touch Gait: Dystaxic   Labs: I have reviewed the data as listed    Component Value Date/Time   NA 125 (L) 06/21/2021 0535   K 4.5 06/21/2021 0535   CL 90 (L) 06/21/2021 0535   CO2 26 06/21/2021 0535   GLUCOSE 91 06/21/2021 0535   BUN 15 06/21/2021 0535   CREATININE 0.53 06/21/2021 0535   CALCIUM 9.2 06/21/2021 0535   PROT 6.5 06/21/2021 0535   ALBUMIN 3.6  06/21/2021 0535   AST 41 06/21/2021 0535   ALT 67 (H) 06/21/2021 0535   ALKPHOS 45 06/21/2021 0535   BILITOT 0.7 06/21/2021 0535   GFRNONAA >60 06/21/2021 0535   Lab Results  Component Value Date   WBC 7.4 06/21/2021   NEUTROABS 3.9 06/21/2021   HGB 14.3 06/21/2021   HCT 41.5 06/21/2021   MCV 85.0 06/21/2021   PLT 282 06/21/2021   Pathology: SURGICAL PATHOLOGY  CASE: [id number]  PATIENT: Junita Push  Surgical Pathology Report   Specimen Submitted:  A. Temporal lesion, left   Clinical History: Left brain mass   DIAGNOSIS:  A. BRAIN, LEFT TEMPORAL LOBE; CRANIOTOMY WITH BIOPSY:  - HIGH GRADE B-CELL LYMPHOMA, COMPATIBLE WITH PRIMARY CNS LYMPHOMA.   Comment:  Biopsy sections display neural tissue predominantly replaced by an  abnormal proliferation of intermediate to large lymphocytes.  These  lymphocytes display vesicular chromatin, prominent nucleoli, high N:C  ratio, and abnormal nuclear contours.  Apoptotic debris is abundant.  In  addition, there are patchy areas of tumor necrosis.   Immunohistochemical studies show this abnormal proliferation to be  comprised of a large CD20+ B cells, with relatively few  CD3+ T cells.  Abnormal B cells are positive for BCL-6 and MUM-1, and negative for  BCL-2.  CD10 shows diffuse dim blush like staining, the significance of  which is unclear.  Cyclin-D1 and CD30 are negative. c-myc is positive,  marking 50-60% of abnormal B cells.  Ki-67 is significantly elevated,  with positivity in greater than 95% of abnormal B-cells.  An  Epstein-Barr virus encoded RNA (EBER) ISH probe is negative.   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  Although CD10 positivity within this entity would  be rare, the absence of radiographic evidence of peripheral adenopathy  or other potential sites of involvement would favor a  primary CNS  lymphoma.  There is sufficient tissue present for ancillary molecular  testing if desired.    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  She again presents with clinical, radiographic, histologic syndrome consistent with Primary B-cell CNS Lymphoma, with no evidence of leptomeningeal or ocular spread.    Hyponatremia: -Picture consistent with SIADH, remains asymptomatic -Continue to appreciate guidance from hospitalist team -BID salt tabs, fluid restriction for now.  T/C adding lasix per medicine team -Will need follow up with nephrology outpatient   CNS Lymphoma: -Due to persistent and refractory hyponatremia, will defer MTX for this admission. -To sign orders for rituximab and contact pharmacy for hopeful administration today   Disposition: -Pending rituximab administration, further input from medical team and nephrology, potential D/C to home for tomorrow.  All questions were answered. The patient knows to call the clinic with any problems, questions or concerns. No barriers to learning were detected.  The total time spent in the encounter was 30 minutes and more than 50% was on counseling and review of test results   Ventura Sellers, MD Medical Director of Neuro-Oncology [facility] [place] 05/19/21 9:59 AM

## 2021-06-22 ENCOUNTER — Other Ambulatory Visit: Payer: Self-pay | Admitting: *Deleted

## 2021-06-22 DIAGNOSIS — C8589 Other specified types of non-Hodgkin lymphoma, extranodal and solid organ sites: Secondary | ICD-10-CM

## 2021-06-22 LAB — COMPREHENSIVE METABOLIC PANEL
ALT: 70 U/L — ABNORMAL HIGH (ref 0–44)
AST: 48 U/L — ABNORMAL HIGH (ref 15–41)
Albumin: 3.4 g/dL — ABNORMAL LOW (ref 3.5–5.0)
Alkaline Phosphatase: 36 U/L — ABNORMAL LOW (ref 38–126)
Anion gap: 9 (ref 5–15)
BUN: 13 mg/dL (ref 8–23)
CO2: 23 mmol/L (ref 22–32)
Calcium: 9 mg/dL (ref 8.9–10.3)
Chloride: 92 mmol/L — ABNORMAL LOW (ref 98–111)
Creatinine, Ser: 0.41 mg/dL — ABNORMAL LOW (ref 0.44–1.00)
GFR, Estimated: >60 mL/min (ref >60)
Glucose, Bld: 98 mg/dL (ref 70–99)
Potassium: 4.3 mmol/L (ref 3.5–5.1)
Sodium: 124 mmol/L — ABNORMAL LOW (ref 135–145)
Total Bilirubin: 0.8 mg/dL (ref 0.3–1.2)
Total Protein: 6.4 g/dL — ABNORMAL LOW (ref 6.5–8.1)

## 2021-06-22 LAB — CBC WITH DIFFERENTIAL/PLATELET
Abs Immature Granulocytes: 0.33 10*3/uL — ABNORMAL HIGH (ref 0.00–0.07)
Basophils Absolute: 0 10*3/uL (ref 0.0–0.1)
Basophils Relative: 0 %
Eosinophils Absolute: 0 10*3/uL (ref 0.0–0.5)
Eosinophils Relative: 0 %
HCT: 40.5 % (ref 36.0–46.0)
Hemoglobin: 13.7 g/dL (ref 12.0–15.0)
Immature Granulocytes: 5 %
Lymphocytes Relative: 21 %
Lymphs Abs: 1.5 10*3/uL (ref 0.7–4.0)
MCH: 29.1 pg (ref 26.0–34.0)
MCHC: 33.8 g/dL (ref 30.0–36.0)
MCV: 86.2 fL (ref 80.0–100.0)
Monocytes Absolute: 0.7 10*3/uL (ref 0.1–1.0)
Monocytes Relative: 9 %
Neutro Abs: 4.9 10*3/uL (ref 1.7–7.7)
Neutrophils Relative %: 65 %
Platelets: 272 10*3/uL (ref 150–400)
RBC: 4.7 MIL/uL (ref 3.87–5.11)
RDW: 13.7 % (ref 11.5–15.5)
WBC: 7.4 10*3/uL (ref 4.0–10.5)
nRBC: 0 % (ref 0.0–0.2)

## 2021-06-22 LAB — URINALYSIS, ROUTINE W REFLEX MICROSCOPIC
Bilirubin Urine: NEGATIVE
Glucose, UA: NEGATIVE mg/dL
Hgb urine dipstick: NEGATIVE
Ketones, ur: NEGATIVE mg/dL
Leukocytes,Ua: NEGATIVE
Nitrite: NEGATIVE
Protein, ur: NEGATIVE mg/dL
Specific Gravity, Urine: 1.021 (ref 1.005–1.030)
pH: 6 (ref 5.0–8.0)

## 2021-06-22 LAB — CREATININE, URINE, RANDOM: Creatinine, Urine: 65.53 mg/dL

## 2021-06-22 LAB — METHOTREXATE: Methotrexate: <0.05

## 2021-06-22 MED ORDER — DEXAMETHASONE 2 MG PO TABS: 2.0000 mg | ORAL_TABLET | Freq: Every day | ORAL | 3 refills | Status: DC

## 2021-06-22 MED ORDER — SODIUM CHLORIDE 1 G PO TABS: 2.0000 g | ORAL_TABLET | Freq: Three times a day (TID) | ORAL | 3 refills | Status: DC

## 2021-06-22 MED ORDER — SODIUM CHLORIDE 1 G PO TABS
2.0000 g | ORAL_TABLET | Freq: Three times a day (TID) | ORAL | Status: DC
  Administered 2021-06-22 – 2021-06-23 (×5): 2 g via ORAL
  Filled 2021-06-22 (×5): qty 2

## 2021-06-22 MED ORDER — AMLODIPINE BESYLATE 2.5 MG PO TABS: 2.5000 mg | ORAL_TABLET | Freq: Every day | ORAL | 3 refills | Status: DC

## 2021-06-22 NOTE — Discharge Summary (Signed)
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     Hospital Course: Patient presented with hyponatremia, Na+ level at 125.  Methotrexate was withheld, and medicine consult team was brought on board.  Interventions included full serologic and urine hyponatremia workup, fluid restriction given euvolemic/hypervolemic status, and TID salt tabs.    Rituximab component of treatment plan was administered on admission day #3 without complication.  Medication Changes: -Started amlodipine 2.5mg  daily -2g NaCl tabs TID initiated -Home protonix to be resumed -Decadron decreased to 2mg  daily at discharge.  Procedures Performed and pertinent labs: No results found.  Discharge Vitals & PE:  BP (!) 142/99   Pulse 64   Temp 97.7 F (36.5 C) (Oral)   Resp 16   Ht 5\' 5"  (1.651 m)   Wt 126 lb 5.2 oz (57.3 kg)   SpO2 94%   BMI 21.02 kg/m   PHYSICAL EXAMINATION: Vitals:   06/21/21 1958 06/22/21 0533  BP: 111/78 (!) 142/99  Pulse: 91 64  Resp: 16 16  Temp: (!) 97.5 F (36.4 C) 97.7 F (36.5 C)  SpO2: 96% 94%   KPS: 60. General: Alert, cooperative, pleasant, in no acute distress Head: Normal EENT: No conjunctival injection or scleral icterus. Lungs: Resp effort normal Cardiac: Regular rate Abdomen: Non-distended abdomen Skin: No rashes cyanosis or petechiae. Extremities: No clubbing or edema   Neurologic Exam: Mental Status: Awake, alert, attentive to examiner. Oriented to self and environment. Language is mildly impaired with regards to both fluency and comprehension.  Further mental status evaluation limited by dysphasia. Cranial  Nerves: Visual acuity is grossly normal. Visual fields are full. Extra-ocular movements intact. No ptosis. Face is symmetric Motor: Tone and bulk are normal. Power is 4+/5 in right arm and leg. Reflexes are symmetric, no pathologic reflexes present. Sensory: Intact to light touch Gait: Dystaxic  Discharge Labs:  Results for orders placed or performed during the hospital encounter of 06/19/21 (from the past 24 hour(s))  Methotrexate (STAT)     Status: None   Collection Time: 06/22/21  5:30 AM  Result Value Ref Range   Methotrexate <0.05   CBC WITH DIFFERENTIAL     Status: Abnormal   Collection Time: 06/22/21  5:30 AM  Result Value Ref Range   WBC 7.4 4.0 - 10.5 K/uL   RBC 4.70 3.87 - 5.11 MIL/uL   Hemoglobin 13.7 12.0 - 15.0 g/dL   HCT 40.5 36.0 - 46.0 %   MCV 86.2 80.0 - 100.0 fL   MCH 29.1 26.0 - 34.0 pg   MCHC 33.8 30.0 - 36.0 g/dL   RDW 13.7 11.5 - 15.5 %   Platelets 272 150 - 400 K/uL   nRBC 0.0 0.0 - 0.2 %   Neutrophils Relative % 65 %   Neutro Abs 4.9 1.7 - 7.7 K/uL   Lymphocytes Relative 21 %   Lymphs Abs 1.5 0.7 - 4.0 K/uL   Monocytes Relative 9 %   Monocytes Absolute 0.7 0.1 - 1.0 K/uL   Eosinophils Relative 0 %   Eosinophils Absolute 0.0 0.0 - 0.5 K/uL   Basophils Relative 0 %   Basophils Absolute 0.0 0.0 - 0.1 K/uL   Immature  Granulocytes 5 %   Abs Immature Granulocytes 0.33 (H) 0.00 - 0.07 K/uL  Comprehensive metabolic panel     Status: Abnormal   Collection Time: 06/22/21  5:30 AM  Result Value Ref Range   Sodium 124 (L) 135 - 145 mmol/L   Potassium 4.3 3.5 - 5.1 mmol/L   Chloride 92 (L) 98 - 111 mmol/L   CO2 23 22 - 32 mmol/L   Glucose, Bld 98 70 - 99 mg/dL   BUN 13 8 - 23 mg/dL   Creatinine, Ser 0.41 (L) 0.44 - 1.00 mg/dL   Calcium 9.0 8.9 - 10.3 mg/dL   Total Protein 6.4 (L) 6.5 - 8.1 g/dL   Albumin 3.4 (L) 3.5 - 5.0 g/dL   AST 48 (H) 15 - 41 U/L   ALT 70 (H) 0 - 44 U/L   Alkaline Phosphatase 36 (L) 38 - 126 U/L   Total Bilirubin 0.8 0.3 - 1.2 mg/dL    GFR, Estimated >60 >60 mL/min   Anion gap 9 5 - 15  Urinalysis, Routine w reflex microscopic Urine, Clean Catch     Status: None   Collection Time: 06/22/21  5:35 AM  Result Value Ref Range   Color, Urine YELLOW YELLOW   APPearance CLEAR CLEAR   Specific Gravity, Urine 1.021 1.005 - 1.030   pH 6.0 5.0 - 8.0   Glucose, UA NEGATIVE NEGATIVE mg/dL   Hgb urine dipstick NEGATIVE NEGATIVE   Bilirubin Urine NEGATIVE NEGATIVE   Ketones, ur NEGATIVE NEGATIVE mg/dL   Protein, ur NEGATIVE NEGATIVE mg/dL   Nitrite NEGATIVE NEGATIVE   Leukocytes,Ua NEGATIVE NEGATIVE    Disposition and follow-up:   Ms.Myrel Glanzer was discharged from in stable condition.    Follow-up Appointments: Will f/u in [place] brain tumor clinic on 7/8 at time TBD.  Will likely schedule brain MRI prior to this appointment.  Will plan to refer to Nephrology as well for outpatient guidance on electrolyte abnormalities   Discharge Medications:  Allergies as of 06/22/2021   No Known Allergies      Medication List     TAKE these medications    acetaminophen 500 MG tablet Commonly known as: TYLENOL Take 500 mg by mouth at bedtime.   amLODipine 2.5 MG tablet Commonly known as: NORVASC Take 1 tablet (2.5 mg total) by mouth daily. Start taking on: June 23, 2021   cyanocobalamin 1000 MCG tablet Take 1,000 mcg by mouth daily.   dexamethasone 2 MG tablet Commonly known as: DECADRON Take 1 tablet (2 mg total) by mouth daily. What changed:  medication strength how much to take   diphenhydrAMINE 50 MG capsule Commonly known as: BENADRYL Take 1 capsule (50 mg total) by mouth at bedtime as needed for sleep.   pantoprazole 40 MG tablet Commonly known as: PROTONIX Take 1 tablet (40 mg total) by mouth at bedtime.   sodium chloride 1 g tablet Take 2 tablets (2 g total) by mouth 3 (three) times daily with meals.         Medications Discontinued During This Encounter  Medication Reason    pantoprazole (PROTONIX) EC tablet 40 mg    furosemide (LASIX) tablet 20 mg    sodium chloride tablet 1 g    dexamethasone (DECADRON) 4 MG tablet Reorder    > 40 minutes time spent preparing d/c summary, including direct face-face patient Time, contact with consultants, family and care coordination   Signed: Ventura Sellers 06/22/2021, 9:47 AM

## 2021-06-22 NOTE — Care Management Important Message (Signed)
Important Message  Patient Details IM  Letter placed in Patient's room. Name: Audrey Barron MRN: [id number] Date of Birth: Nov 25, 1951   Medicare Important Message Given:  Yes     Kerin Salen 06/22/2021, 9:20 AM

## 2021-06-22 NOTE — Evaluation (Signed)
Physical Therapy Evaluation Patient Details Name: Audrey Barron MRN: [id number] DOB: October 01, 1951 Today's Date: 06/22/2021   History of Present Illness  70 year old female with history of CNS lymphoma, dementia , gait disturbance, confusion and speech difficulty, infiltrativeCNS tumor in the left hemisphere of her brain. 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 Presents 06/19/21 with hyponatremia with sodium of 125  Clinical Impression  The patient is very cheerful,  enjoying snacks. Patient requires no assistance for bed mobility, ambulated x 60' with min guard and no device.  No family present to confirm prior function/assistance. Patient appears at baseline as she ambulated without physical assistance. Patient will benefit from PT while in acute care to ensure current function remains stable.      Follow Up Recommendations No PT follow up    Equipment Recommendations  None recommended by PT    Recommendations for Other Services       Precautions / Restrictions Precautions Precautions: Fall Restrictions Weight Bearing Restrictions: No      Mobility  Bed Mobility Overal bed mobility: Needs Assistance Bed Mobility: Supine to Sit     Supine to sit: Supervision          Transfers Overall transfer level: Needs assistance Equipment used: None Transfers: Sit to/from Stand Sit to Stand: Supervision            Ambulation/Gait Ambulation/Gait assistance: Min guard Gait Distance (Feet): 60 Feet Assistive device: None Gait Pattern/deviations: Step-through pattern Gait velocity: decr   General Gait Details: at times will hold onto bed board,  Stairs            Wheelchair Mobility    Modified Rankin (Stroke Patients Only)       Balance Overall balance assessment: Mild deficits observed, not formally tested                                           Pertinent Vitals/Pain Pain Assessment: No/denies pain    Home Living  Family/patient expects to be discharged to:: Private residence Living Arrangements: Children Available Help at Discharge: Family;Available PRN/intermittently Type of Home: House Home Access: Stairs to enter Entrance Stairs-Rails: None Entrance Stairs-Number of Steps: 2 Home Layout: One level Home Equipment: Walker - 2 wheels Additional Comments: Patient limited historian, no family present.    Prior Function           Comments: patient  states that she takes a bath, has had no falls,  independnet  with self ADL's. No family present.     Hand Dominance   Dominant Hand: Right    Extremity/Trunk Assessment   Upper Extremity Assessment Upper Extremity Assessment: Overall WFL for tasks assessed    Lower Extremity Assessment Lower Extremity Assessment: Overall WFL for tasks assessed    Cervical / Trunk Assessment Cervical / Trunk Assessment: Normal  Communication   Communication: Expressive difficulties  Cognition Arousal/Alertness: Awake/alert Behavior During Therapy: WFL for tasks assessed/performed Overall Cognitive Status: No family/caregiver present to determine baseline cognitive functioning Area of Impairment: Orientation;Following commands;Awareness                 Orientation Level: Time;Situation;Place     Following Commands: Follows multi-step commands consistently   Awareness: Anticipatory          General Comments      Exercises     Assessment/Plan    PT Assessment Patient needs continued PT services  PT Problem List Decreased activity tolerance;Decreased balance;Decreased cognition;Decreased knowledge of use of DME;Decreased safety awareness       PT Treatment Interventions Gait training;DME instruction;Therapeutic activities;Therapeutic exercise;Functional mobility training;Balance training    PT Goals (Current goals can be found in the Care Plan section)  Acute Rehab PT Goals Patient Stated Goal: to go home PT Goal Formulation:  Patient unable to participate in goal setting Time For Goal Achievement: 07/06/21 Potential to Achieve Goals: Good    Frequency Min 3X/week   Barriers to discharge        Co-evaluation               AM-PAC PT "6 Clicks" Mobility  Outcome Measure Help needed turning from your back to your side while in a flat bed without using bedrails?: None Help needed moving from lying on your back to sitting on the side of a flat bed without using bedrails?: None Help needed moving to and from a bed to a chair (including a wheelchair)?: A Little Help needed standing up from a chair using your arms (e.g., wheelchair or bedside chair)?: A Little Help needed to walk in hospital room?: A Little Help needed climbing 3-5 steps with a railing? : A Little 6 Click Score: 20    End of Session Equipment Utilized During Treatment: Gait belt Activity Tolerance: Patient tolerated treatment well Patient left: in bed;with call bell/phone within reach;with bed alarm set Nurse Communication: Mobility status PT Visit Diagnosis: Unsteadiness on feet (R26.81)    Time: 3748-2707 PT Time Calculation (min) (ACUTE ONLY): 11 min   Charges:   PT Evaluation $PT Eval Low Complexity: [place] Pager [phone] Office [phone]    Claretha Cooper 06/22/2021, 11:30 AM

## 2021-06-22 NOTE — Progress Notes (Addendum)
[facility] consult note                                                                              Patient Demographics  Audrey Barron, is a 70 y.o. female, DOB - May 19, 1951, [place] date - 06/19/2021   Admitting Physician Ventura Sellers, MD  Outpatient Primary MD for the patient is Marguerita Merles, MD  Outpatient specialists:   LOS - 3  days   Medical records reviewed and are as summarized below:    No chief complaint on file.      Brief summary   70 year old female with history of CNS lymphoma, dementia presented for initiation of induction therapy for CNS lymphoma.  She apparently had several months of gait disturbance, confusion and speech difficulty.  Work-up showed infiltrative tumor in the left hemisphere of her brain.  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  She was put on Decadron and discharged home.   BMET on admission showed hyponatremia with sodium of 125.  TRH was consulted for hyponatremia.  Patient was admitted from oncology office, Dr. 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  Lasix was added yesterday, which I have discontinued -Increase salt tablets to 2 g  TID, discussed with nephrology, Dr. Elissa Hefty   Active Problems: Secondary adrenal insufficiency -Cortisol level less than 0.4, likely suppression from Decadron    Primary CNS lymphoma ([place]), with mental status changes -Management per oncology, Dr Mickeal Skinner   Essential hypertension -Continue amlodipine 2.5 mg daily   Generalized debility -PT evaluation done, close to her baseline, no PT follow-up   Code Status: Full CODE STATUS DVT Prophylaxis:  enoxaparin (LOVENOX) injection 40 mg Start: 06/19/21 1200   Level of Care: Level of care:  Med-Surg Family Communication: Discussed all imaging results, lab results, explained to the patient.  No family at the bedside  Disposition Plan:     Status is: Inpatient  Remains inpatient appropriate because:Inpatient level of care appropriate due to severity of illness  Dispo: The patient is from: Home              Anticipated d/c is to: Home              Patient currently is not medically stable to d/c.  Sodium trending down today.  Discussed with Dr. Mickeal Skinner, will hold off the discharge, recheck BMET in a.m.   Difficult to place patient No      Time Spent in minutes   25 minutes Procedures:  None  Antimicrobials:   Anti-infectives (From admission, onward)    None          Medications  Scheduled Meds:  acetaminophen  500 mg Oral QHS   amLODipine  2.5 mg Oral Daily   Chlorhexidine Gluconate Cloth  6 each Topical Daily   dexamethasone  4 mg Oral Daily   enoxaparin (LOVENOX) injection  40 mg Subcutaneous Daily  sodium chloride  2 g Oral TID WC   cyanocobalamin  1,000 mcg Oral Daily   Continuous Infusions:  ondansetron (ZOFRAN) IV     PRN Meds:.acetaminophen, diphenhydrAMINE, ondansetron **OR** ondansetron **OR** ondansetron (ZOFRAN) IV **OR** ondansetron (ZOFRAN) IV, senna-docusate      Subjective:   Abrish Erny was seen and examined today.  Somewhat confused, pleasant.  No acute issues overnight.  No ongoing nausea vomiting or diarrhea.  No fevers  Objective:   Vitals:   06/21/21 1437 06/21/21 1958 06/22/21 0533 06/22/21 1432  BP: (!) 127/95 111/78 (!) 142/99 (!) 131/94  Pulse: 75 91 64 81  Resp:  16 16 16   Temp:  (!) 97.5 F (36.4 C) 97.7 F (36.5 C) (!) 97.5 F (36.4 C)  TempSrc:  Oral Oral Oral  SpO2: 97% 96% 94% 100%  Weight:      Height:        Intake/Output Summary (Last 24 hours) at 06/22/2021 1513 Last data filed at 06/22/2021 1023 Gross per 24 hour  Intake 50 ml  Output 300 ml  Net -250 ml     Wt Readings from Last 3  Encounters:  06/21/21 57.3 kg  06/16/21 57.6 kg  05/19/21 54.8 kg    Physical Exam General: Alert and oriented x self, dysphasia Cardiovascular: S1 S2 clear, RRR. No pedal edema b/l Respiratory: CTAB, no wheezing, rales or rhonchi Gastrointestinal: Soft, nontender, nondistended, NBS Ext: no pedal edema bilaterally Neuro: no new deficits, 4/5 on the right side, 5/5 on the left side Psych: somewhat confused   Data Reviewed:  I have personally reviewed following labs and imaging studies  Micro Results Recent Results (from the past 240 hour(s))  SARS CORONAVIRUS 2 (TAT 6-24 HRS) Nasopharyngeal Nasopharyngeal Swab     Status: None   Collection Time: 06/16/21 12:20 PM   Specimen: Nasopharyngeal Swab  Result Value Ref Range Status   SARS Coronavirus 2 NEGATIVE NEGATIVE Final    Comment: (NOTE) SARS-CoV-2 target nucleic acids are NOT DETECTED.  The SARS-CoV-2 RNA is generally detectable in upper and lower respiratory specimens during the acute phase of infection. Negative results do not preclude SARS-CoV-2 infection, do not rule out co-infections with other pathogens, and should not be used as the sole basis for treatment or other patient management decisions. Negative results must be combined with clinical observations, patient history, and epidemiological information. The expected result is Negative.  Fact Sheet for Patients: SugarRoll.be  Fact Sheet for Healthcare Providers: https://www.woods-mathews.com/  This test is not yet approved or cleared by the [place] FDA and  has been authorized for detection and/or diagnosis of SARS-CoV-2 by FDA under an Emergency Use Authorization (EUA). This EUA will remain  in effect (meaning this test can be used) for the duration of the COVID-19 declaration under Se ction 564(b)(1) of the Act, 21 U.S.C. section 360bbb-3(b)(1), unless the authorization is terminated or revoked  sooner.  Performed at [facility], [place] [address] [place], [place] [address]     Radiology Reports No results found.  Lab Data:  CBC: Recent Labs  Lab 06/16/21 0914 06/20/21 0555 06/21/21 0535 06/22/21 0530  WBC 6.6 7.1 7.4 7.4  NEUTROABS 3.3 3.1 3.9 4.9  HGB 13.7 13.6 14.3 13.7  HCT 39.5 39.9 41.5 40.5  MCV 84.4 84.9 85.0 86.2  PLT 250 277 282 081   Basic Metabolic Panel: Recent Labs  Lab 06/16/21 0914 06/19/21 1049 06/20/21 0555 06/21/21 0535 06/22/21 0530  NA 124* 125* 126* 125* 124*  K 3.8 3.8 4.2 4.5 4.3  CL 91* 94* 91* 90* 92*  CO2 23 25 26 26 23   GLUCOSE 141* 109* 76 91 98  BUN 10 8 11 15 13   CREATININE 0.53 0.54 0.58 0.53 0.41*  CALCIUM 8.9 8.7* 9.1 9.2 9.0  MG  --  1.8  --   --   --   PHOS  --  3.1  --   --   --    GFR: Estimated Creatinine Clearance: 58.9 mL/min (A) (by C-G formula based on SCr of 0.41 mg/dL (L)). Liver Function Tests: Recent Labs  Lab 06/16/21 0914 06/19/21 1049 06/20/21 0555 06/21/21 0535 06/22/21 0530  AST 51* 48* 40 41 48*  ALT 69* 72* 67* 67* 70*  ALKPHOS 35* 32* 32* 45 36*  BILITOT 1.0 0.8 1.0 0.7 0.8  PROT 6.7 6.3* 6.4* 6.5 6.4*  ALBUMIN 3.6 3.4* 3.6 3.6 3.4*   No results for input(s): LIPASE, AMYLASE in the last 168 hours. No results for input(s): AMMONIA in the last 168 hours. Coagulation Profile: No results for input(s): INR, PROTIME in the last 168 hours. Cardiac Enzymes: No results for input(s): CKTOTAL, CKMB, CKMBINDEX, TROPONINI in the last 168 hours. BNP (last 3 results) No results for input(s): PROBNP in the last 8760 hours. HbA1C: No results for input(s): HGBA1C in the last 72 hours. CBG: No results for input(s): GLUCAP in the last 168 hours. Lipid Profile: No results for input(s): CHOL, HDL, LDLCALC, TRIG, CHOLHDL, LDLDIRECT in the last 72 hours. Thyroid Function Tests: Recent Labs    06/20/21 0555  TSH 1.199   Anemia Panel: No results for input(s): VITAMINB12, FOLATE, FERRITIN,  TIBC, IRON, RETICCTPCT in the last 72 hours. Urine analysis:    Component Value Date/Time   COLORURINE YELLOW 06/22/2021 0535   APPEARANCEUR CLEAR 06/22/2021 0535   LABSPEC 1.021 06/22/2021 0535   PHURINE 6.0 06/22/2021 [place] 06/22/2021 0535   HGBUR NEGATIVE 06/22/2021 [place] NEGATIVE 06/22/2021 [place] 06/22/2021 0535   PROTEINUR NEGATIVE 06/22/2021 0535   NITRITE NEGATIVE 06/22/2021 0535   LEUKOCYTESUR NEGATIVE 06/22/2021 0535     Alhassan Everingham M.D. [facility] 06/22/2021, 3:13 PM  Available via Epic secure chat 7am-7pm After 7 pm, please refer to night coverage provider listed on amion.

## 2021-06-22 NOTE — Consult Note (Signed)
Renal Service Consult Note [facility]  Catharine Kettlewell 06/22/2021 Sol Blazing, MD Requesting Physician: Dr. Tana Coast  Reason for Consult: Hyponatremia HPI: The patient is a 70 y.o. year-old w/ hx of dementia and CNS lymphoma presented for chemoRx for the lymphoma. Na 125 on admission. Pt confused but this has been going on for several mos. Asked to see for low sodium.   Pt unable to provide much history, knows the year but not the place or state or town.  No CP, abd pain or N/V/D.    H/o hyponatremia during admission in early May 2022, admit Na was 131, then dropped to 118 then improved to 139 on dc. No other prior low Na's.   Admit 5/4- 05/02/21 >> admitted for falls, confusion, slurred speech, L facial droop, w/ hx of ovarian Ca.  Had brain mass by imaging. Went to OR of tumor excision on 5/06 by NSurg. Postop Na levels dropped to 118 then improved. She rec'd mannitol, 3% Na and tolvaptan.   Admit 6/6- 06/02/21 >> pt presented for 1st chemo Rx cycle of MTX high dose and rituximab for CNS lymphoma.  She rec'd 3 days of treatments w/ MTX and rituximab was given on D#3. Dc'd on decadron at 2mg  per day.   ROS - denies CP, no joint pain, no HA, no blurry vision, no rash, no diarrhea, no nausea/ vomiting, no dysuria, no difficulty voiding   Past Medical History  Past Medical History:  Diagnosis Date   Cancer ([place])    lymphoma   Dementia ([place])    Past Surgical History  Past Surgical History:  Procedure Laterality Date   APPLICATION OF CRANIAL NAVIGATION N/A 04/28/2021   Procedure: APPLICATION OF CRANIAL NAVIGATION;  Surgeon: Meade Maw, MD;  Location: [facility] ORS;  Service: Neurosurgery;  Laterality: N/A;   CRANIOTOMY Left 04/28/2021   Procedure: CRANIOTOMY TUMOR EXCISION;  Surgeon: Meade Maw, MD;  Location: [facility] ORS;  Service: Neurosurgery;  Laterality: Left;   IR IMAGING GUIDED PORT INSERTION  05/12/2021   Family History History reviewed. No pertinent  family history. Social History  reports that she has never smoked. She has never used smokeless tobacco. She reports previous alcohol use. She reports previous drug use. Allergies No Known Allergies Home medications Prior to Admission medications   Medication Sig Start Date End Date Taking? Authorizing Provider  acetaminophen (TYLENOL) 500 MG tablet Take 500 mg by mouth at bedtime.   Yes [provider]  cyanocobalamin 1000 MCG tablet Take 1,000 mcg by mouth daily.   Yes [provider]  diphenhydrAMINE (BENADRYL) 50 MG capsule Take 1 capsule (50 mg total) by mouth at bedtime as needed for sleep. 05/02/21  Yes Sharen Hones, MD  pantoprazole (PROTONIX) 40 MG tablet Take 1 tablet (40 mg total) by mouth at bedtime. 05/24/21  Yes Vaslow, Acey Lav, MD  amLODipine (NORVASC) 2.5 MG tablet Take 1 tablet (2.5 mg total) by mouth daily. 06/23/21   Vaslow, Acey Lav, MD  dexamethasone (DECADRON) 2 MG tablet Take 1 tablet (2 mg total) by mouth daily. 06/22/21   Ventura Sellers, MD  sodium chloride 1 g tablet Take 2 tablets (2 g total) by mouth 3 (three) times daily with meals. 06/22/21   Ventura Sellers, MD     Vitals:   06/21/21 1437 06/21/21 1958 06/22/21 0533 06/22/21 1432  BP: (!) 127/95 111/78 (!) 142/99 (!) 131/94  Pulse: 75 91 64 81  Resp:  16 16 16   Temp:  (!) 97.5  F (36.4 C) 97.7 F (36.5 C) (!) 97.5 F (36.4 C)  TempSrc:  Oral Oral Oral  SpO2: 97% 96% 94% 100%  Weight:      Height:       Exam Gen alert, no distress, frail No rash, cyanosis or gangrene Sclera anicteric, throat clear and moist  No jvd or bruits Chest clear bilat to bases, no rales/ wheezing RRR no MRG Abd soft ntnd no mass or ascites +bs GU normal MS no joint effusions or deformity Ext no LE or UE edema, no wounds or ulcers Neuro is alert, Ox 1.5 , nf        Urine Na 81, Uosm 288    UA negative   Assessment/ Plan: Hyponatremia - in pt w/ known CNS lymphoma, undergoing chemoRx. Pt is  euvolemic exam.  Cortisol is very low due to decadron Rx. 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  Recommend increase salt tabs to 2 gm tid and restrict fluids to 1200 cc max.  Will follow.  CNS lymphoma - per ONC/ primary HTN - on low dose norvasc AMS - hx of several mos of confusion Debility - PT evaluating      Rob Letica Giaimo  MD 06/22/2021, 4:30 PM  Recent Labs  Lab 06/21/21 0535 06/22/21 0530  WBC 7.4 7.4  HGB 14.3 13.7   Recent Labs  Lab 06/19/21 1049 06/20/21 0555 06/21/21 0535 06/22/21 0530  K 3.8   < > 4.5 4.3  BUN 8   < > 15 13  CREATININE 0.54   < > 0.53 0.41*  CALCIUM 8.7*   < > 9.2 9.0  PHOS 3.1  --   --   --    < > = values in this interval not displayed.

## 2021-06-23 DIAGNOSIS — I10 Essential (primary) hypertension: Secondary | ICD-10-CM | POA: Diagnosis present

## 2021-06-23 LAB — CBC WITH DIFFERENTIAL/PLATELET
Abs Immature Granulocytes: 0.24 10*3/uL — ABNORMAL HIGH (ref 0.00–0.07)
Basophils Absolute: 0 10*3/uL (ref 0.0–0.1)
Basophils Relative: 0 %
Eosinophils Absolute: 0 10*3/uL (ref 0.0–0.5)
Eosinophils Relative: 0 %
HCT: 38.8 % (ref 36.0–46.0)
Hemoglobin: 13.2 g/dL (ref 12.0–15.0)
Immature Granulocytes: 3 %
Lymphocytes Relative: 25 %
Lymphs Abs: 2.2 10*3/uL (ref 0.7–4.0)
MCH: 28.9 pg (ref 26.0–34.0)
MCHC: 34 g/dL (ref 30.0–36.0)
MCV: 85.1 fL (ref 80.0–100.0)
Monocytes Absolute: 0.9 10*3/uL (ref 0.1–1.0)
Monocytes Relative: 11 %
Neutro Abs: 5.3 10*3/uL (ref 1.7–7.7)
Neutrophils Relative %: 61 %
Platelets: 248 10*3/uL (ref 150–400)
RBC: 4.56 MIL/uL (ref 3.87–5.11)
RDW: 13.4 % (ref 11.5–15.5)
WBC: 8.7 10*3/uL (ref 4.0–10.5)
nRBC: 0 % (ref 0.0–0.2)

## 2021-06-23 LAB — COMPREHENSIVE METABOLIC PANEL
ALT: 70 U/L — ABNORMAL HIGH (ref 0–44)
AST: 49 U/L — ABNORMAL HIGH (ref 15–41)
Albumin: 3.3 g/dL — ABNORMAL LOW (ref 3.5–5.0)
Alkaline Phosphatase: 33 U/L — ABNORMAL LOW (ref 38–126)
Anion gap: 7 (ref 5–15)
BUN: 10 mg/dL (ref 8–23)
CO2: 26 mmol/L (ref 22–32)
Calcium: 8.7 mg/dL — ABNORMAL LOW (ref 8.9–10.3)
Chloride: 91 mmol/L — ABNORMAL LOW (ref 98–111)
Creatinine, Ser: 0.52 mg/dL (ref 0.44–1.00)
GFR, Estimated: >60 mL/min (ref >60)
Glucose, Bld: 81 mg/dL (ref 70–99)
Potassium: 4.2 mmol/L (ref 3.5–5.1)
Sodium: 124 mmol/L — ABNORMAL LOW (ref 135–145)
Total Bilirubin: 0.8 mg/dL (ref 0.3–1.2)
Total Protein: 6.2 g/dL — ABNORMAL LOW (ref 6.5–8.1)

## 2021-06-23 LAB — SODIUM: Sodium: 133 mmol/L — ABNORMAL LOW (ref 135–145)

## 2021-06-23 LAB — METHOTREXATE: Methotrexate: <0.05

## 2021-06-23 MED ORDER — SODIUM CHLORIDE 1 G PO TABS
2.0000 g | ORAL_TABLET | Freq: Two times a day (BID) | ORAL | Status: DC
  Administered 2021-06-23 – 2021-06-24 (×2): 2 g via ORAL
  Filled 2021-06-23 (×2): qty 2

## 2021-06-23 MED ORDER — TOLVAPTAN 15 MG PO TABS
15.0000 mg | ORAL_TABLET | Freq: Once | ORAL | Status: AC
  Administered 2021-06-23: 15 mg via ORAL
  Filled 2021-06-23: qty 1

## 2021-06-23 NOTE — Progress Notes (Signed)
[facility] Progress Note  Subjective: Na 124, no better  Vitals:   06/22/21 2141 06/23/21 0500 06/23/21 0512 06/23/21 0522  BP: (!) 133/93  (!) 150/112 (!) 142/94  Pulse: 72  67   Resp: 14  18   Temp: 97.6 F (36.4 C)  98 F (36.7 C)   TempSrc: Oral  Oral   SpO2: 100%  97%   Weight:  56 kg    Height:        Exam:  alert, nad   no jvd  Chest cta bilat  Cor reg no RG  Abd soft ntnd no ascites   Ext no LE edema   Alert, NF, Ox 1.5,  poor memory            Urine Na 81, Uosm 288    UA negative     Assessment/ Plan: Hyponatremia - in pt w/ known CNS lymphoma, undergoing chemoRx. Pt is euvolemic exam.  Cortisol is very low due to decadron Rx. 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  Na 124- 126, not improving despite salt tabs and fluid restriction. Will give tolvaptan 15 mg today.  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   Inpatient medications:  acetaminophen  500 mg Oral QHS   amLODipine  2.5 mg Oral Daily   Chlorhexidine Gluconate Cloth  6 each Topical Daily   dexamethasone  4 mg Oral Daily   enoxaparin (LOVENOX) injection  40 mg Subcutaneous Daily   sodium chloride  2 g Oral TID WC   cyanocobalamin  1,000 mcg Oral Daily    ondansetron (ZOFRAN) IV     acetaminophen, diphenhydrAMINE, ondansetron **OR** ondansetron **OR** ondansetron (ZOFRAN) IV **OR** ondansetron (ZOFRAN) IV, senna-docusate

## 2021-06-23 NOTE — Progress Notes (Signed)
[facility] consult note                                                                              Patient Demographics  Audrey Barron, is a 70 y.o. female, DOB - [birth year], [place] date - 06/19/2021   Admitting Physician Ventura Sellers, MD  Outpatient Primary MD for the patient is Marguerita Merles, MD  Outpatient specialists:   LOS - 4  days   Medical records reviewed and are as summarized below:    No chief complaint on file.      Brief summary   70 year old female with history of CNS lymphoma, dementia presented for initiation of induction therapy for CNS lymphoma.  She apparently had several months of gait disturbance, confusion and speech difficulty.  Work-up showed infiltrative tumor in the left hemisphere of her brain.  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  She was put on Decadron and discharged home.   BMET on admission showed hyponatremia with sodium of 125.  TRH was consulted for hyponatremia.  Patient was admitted from oncology office, Dr. 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  Lasix DC'd. -Salt tablets increased to 2 g 3 times daily on 6/30, sodium still 124 -Will give tolvaptan 15 mg x 1 today.  Nephrology following  Active Problems: Secondary adrenal insufficiency -Cortisol level less than 0.4, likely suppression from Decadron    Primary CNS lymphoma ([place]), with chronic mental status changes -Management per oncology, Dr Mickeal Skinner   Essential hypertension -BP stable, continue amlodipine  Generalized debility -PT evaluation done, close to her baseline, no PT follow-up   Code Status: Full CODE STATUS DVT Prophylaxis:  enoxaparin (LOVENOX) injection 40 mg Start: 06/19/21 1200   Level of Care:  Level of care: Med-Surg Family Communication: Discussed all imaging results, lab results, explained to the patient's daughter, Audrey Barron on phone      Disposition Plan:     Status is: Inpatient  Remains inpatient appropriate because:Inpatient level of care appropriate due to severity of illness  Dispo: The patient is from: Home              Anticipated d/c is to: Home              Patient currently is not medically stable to d/c.  Sodium still 124, giving tolvaptan today   Difficult to place patient No      Time Spent in minutes   25 minutes  Procedures:  None  Antimicrobials:   Anti-infectives (From admission, onward)    None          Medications  Scheduled Meds:  acetaminophen  500 mg Oral QHS   amLODipine  2.5 mg Oral Daily   Chlorhexidine Gluconate Cloth  6 each Topical Daily   dexamethasone  4 mg Oral Daily   enoxaparin (LOVENOX) injection  40 mg Subcutaneous Daily   sodium chloride  2  g Oral BID WC   cyanocobalamin  1,000 mcg Oral Daily   Continuous Infusions:  ondansetron (ZOFRAN) IV     PRN Meds:.acetaminophen, diphenhydrAMINE, ondansetron **OR** ondansetron **OR** ondansetron (ZOFRAN) IV **OR** ondansetron (ZOFRAN) IV, senna-docusate      Subjective:   Audrey Barron was seen and examined today.  Pleasantly confused, no acute issues.  No active nausea vomiting or diarrhea.  Oriented to self.   Objective:   Vitals:   06/23/21 0500 06/23/21 0512 06/23/21 0522 06/23/21 1343  BP:  (!) 150/112 (!) 142/94 (!) 130/94  Pulse:  67  88  Resp:  18  20  Temp:  98 F (36.7 C)  98.4 F (36.9 C)  TempSrc:  Oral  Oral  SpO2:  97%  98%  Weight: 56 kg     Height:        Intake/Output Summary (Last 24 hours) at 06/23/2021 1358 Last data filed at 06/23/2021 1300 Gross per 24 hour  Intake 605 ml  Output 175 ml  Net 430 ml     Wt Readings from Last 3 Encounters:  06/23/21 56 kg  06/16/21 57.6 kg  05/19/21 54.8 kg   Physical  Exam General: Alert and oriented to self, NAD Cardiovascular: S1 S2 clear, RRR. No pedal edema b/l Respiratory: CTAB, no wheezing, rales or rhonchi Gastrointestinal: Soft, nontender, nondistended, NBS Ext: no pedal edema bilaterally Neuro: no new acute deficits. Psych: somewhat confused   Data Reviewed:  I have personally reviewed following labs and imaging studies  Micro Results Recent Results (from the past 240 hour(s))  SARS CORONAVIRUS 2 (TAT 6-24 HRS) Nasopharyngeal Nasopharyngeal Swab     Status: None   Collection Time: 06/16/21 12:20 PM   Specimen: Nasopharyngeal Swab  Result Value Ref Range Status   SARS Coronavirus 2 NEGATIVE NEGATIVE Final    Comment: (NOTE) SARS-CoV-2 target nucleic acids are NOT DETECTED.  The SARS-CoV-2 RNA is generally detectable in upper and lower respiratory specimens during the acute phase of infection. Negative results do not preclude SARS-CoV-2 infection, do not rule out co-infections with other pathogens, and should not be used as the sole basis for treatment or other patient management decisions. Negative results must be combined with clinical observations, patient history, and epidemiological information. The expected result is Negative.  Fact Sheet for Patients: SugarRoll.be  Fact Sheet for Healthcare Providers: https://www.woods-mathews.com/  This test is not yet approved or cleared by the [place] FDA and  has been authorized for detection and/or diagnosis of SARS-CoV-2 by FDA under an Emergency Use Authorization (EUA). This EUA will remain  in effect (meaning this test can be used) for the duration of the COVID-19 declaration under Se ction 564(b)(1) of the Act, 21 U.S.C. section 360bbb-3(b)(1), unless the authorization is terminated or revoked sooner.  Performed at [facility], [place] [address], [place], [place] [address]     Radiology Reports No results found.  Lab  Data:  CBC: Recent Labs  Lab 06/20/21 0555 06/21/21 0535 06/22/21 0530 06/23/21 0430  WBC 7.1 7.4 7.4 8.7  NEUTROABS 3.1 3.9 4.9 5.3  HGB 13.6 14.3 13.7 13.2  HCT 39.9 41.5 40.5 38.8  MCV 84.9 85.0 86.2 85.1  PLT 277 282 272 563   Basic Metabolic Panel: Recent Labs  Lab 06/19/21 1049 06/20/21 0555 06/21/21 0535 06/22/21 0530 06/23/21 0430  NA 125* 126* 125* 124* 124*  K 3.8 4.2 4.5 4.3 4.2  CL 94* 91* 90* 92* 91*  CO2 25 26 26  23  26  GLUCOSE 109* 76 91 98 81  BUN 8 11 15 13 10   CREATININE 0.54 0.58 0.53 0.41* 0.52  CALCIUM 8.7* 9.1 9.2 9.0 8.7*  MG 1.8  --   --   --   --   PHOS 3.1  --   --   --   --    GFR: Estimated Creatinine Clearance: 57.8 mL/min (by C-G formula based on SCr of 0.52 mg/dL). Liver Function Tests: Recent Labs  Lab 06/19/21 1049 06/20/21 0555 06/21/21 0535 06/22/21 0530 06/23/21 0430  AST 48* 40 41 48* 49*  ALT 72* 67* 67* 70* 70*  ALKPHOS 32* 32* 45 36* 33*  BILITOT 0.8 1.0 0.7 0.8 0.8  PROT 6.3* 6.4* 6.5 6.4* 6.2*  ALBUMIN 3.4* 3.6 3.6 3.4* 3.3*   No results for input(s): LIPASE, AMYLASE in the last 168 hours. No results for input(s): AMMONIA in the last 168 hours. Coagulation Profile: No results for input(s): INR, PROTIME in the last 168 hours. Cardiac Enzymes: No results for input(s): CKTOTAL, CKMB, CKMBINDEX, TROPONINI in the last 168 hours. BNP (last 3 results) No results for input(s): PROBNP in the last 8760 hours. HbA1C: No results for input(s): HGBA1C in the last 72 hours. CBG: No results for input(s): GLUCAP in the last 168 hours. Lipid Profile: No results for input(s): CHOL, HDL, LDLCALC, TRIG, CHOLHDL, LDLDIRECT in the last 72 hours. Thyroid Function Tests: No results for input(s): TSH, T4TOTAL, FREET4, T3FREE, THYROIDAB in the last 72 hours.  Anemia Panel: No results for input(s): VITAMINB12, FOLATE, FERRITIN, TIBC, IRON, RETICCTPCT in the last 72 hours. Urine analysis:    Component Value Date/Time   COLORURINE  YELLOW 06/22/2021 0535   APPEARANCEUR CLEAR 06/22/2021 0535   LABSPEC 1.021 06/22/2021 0535   PHURINE 6.0 06/22/2021 [place] 06/22/2021 0535   HGBUR NEGATIVE 06/22/2021 [place] NEGATIVE 06/22/2021 [place] 06/22/2021 0535   PROTEINUR NEGATIVE 06/22/2021 0535   NITRITE NEGATIVE 06/22/2021 0535   LEUKOCYTESUR NEGATIVE 06/22/2021 0535     Marquiz Sotelo M.D. [facility] 06/23/2021, 1:58 PM  Available via Epic secure chat 7am-7pm After 7 pm, please refer to night coverage provider listed on amion.

## 2021-06-24 DIAGNOSIS — F05 Delirium due to known physiological condition: Secondary | ICD-10-CM

## 2021-06-24 DIAGNOSIS — E871 Hypo-osmolality and hyponatremia: Secondary | ICD-10-CM

## 2021-06-24 DIAGNOSIS — I10 Essential (primary) hypertension: Secondary | ICD-10-CM

## 2021-06-24 LAB — COMPREHENSIVE METABOLIC PANEL
ALT: 99 U/L — ABNORMAL HIGH (ref 0–44)
AST: 79 U/L — ABNORMAL HIGH (ref 15–41)
Albumin: 3.8 g/dL (ref 3.5–5.0)
Alkaline Phosphatase: 38 U/L (ref 38–126)
Anion gap: 10 (ref 5–15)
BUN: 18 mg/dL (ref 8–23)
CO2: 25 mmol/L (ref 22–32)
Calcium: 9.5 mg/dL (ref 8.9–10.3)
Chloride: 102 mmol/L (ref 98–111)
Creatinine, Ser: 0.53 mg/dL (ref 0.44–1.00)
GFR, Estimated: >60 mL/min (ref >60)
Glucose, Bld: 83 mg/dL (ref 70–99)
Potassium: 4.2 mmol/L (ref 3.5–5.1)
Sodium: 137 mmol/L (ref 135–145)
Total Bilirubin: 0.6 mg/dL (ref 0.3–1.2)
Total Protein: 7.3 g/dL (ref 6.5–8.1)

## 2021-06-24 LAB — CBC WITH DIFFERENTIAL/PLATELET
Abs Immature Granulocytes: 0.21 10*3/uL — ABNORMAL HIGH (ref 0.00–0.07)
Basophils Absolute: 0.1 10*3/uL (ref 0.0–0.1)
Basophils Relative: 1 %
Eosinophils Absolute: 0 10*3/uL (ref 0.0–0.5)
Eosinophils Relative: 0 %
HCT: 45.6 % (ref 36.0–46.0)
Hemoglobin: 15.2 g/dL — ABNORMAL HIGH (ref 12.0–15.0)
Immature Granulocytes: 3 %
Lymphocytes Relative: 27 %
Lymphs Abs: 2.3 10*3/uL (ref 0.7–4.0)
MCH: 29 pg (ref 26.0–34.0)
MCHC: 33.3 g/dL (ref 30.0–36.0)
MCV: 87 fL (ref 80.0–100.0)
Monocytes Absolute: 1.1 10*3/uL — ABNORMAL HIGH (ref 0.1–1.0)
Monocytes Relative: 13 %
Neutro Abs: 4.9 10*3/uL (ref 1.7–7.7)
Neutrophils Relative %: 56 %
Platelets: 279 10*3/uL (ref 150–400)
RBC: 5.24 MIL/uL — ABNORMAL HIGH (ref 3.87–5.11)
RDW: 14 % (ref 11.5–15.5)
WBC: 8.6 10*3/uL (ref 4.0–10.5)
nRBC: 0 % (ref 0.0–0.2)

## 2021-06-24 MED ORDER — SODIUM CHLORIDE 1 G PO TABS
1.0000 g | ORAL_TABLET | Freq: Two times a day (BID) | ORAL | 3 refills | Status: DC
Start: 2021-06-24 — End: 2021-06-24

## 2021-06-24 MED ORDER — SODIUM CHLORIDE 1 G PO TABS
1.0000 g | ORAL_TABLET | Freq: Two times a day (BID) | ORAL | Status: DC
  Filled 2021-06-24: qty 1

## 2021-06-24 MED ORDER — SODIUM CHLORIDE 1 G PO TABS: 1.0000 g | ORAL_TABLET | Freq: Two times a day (BID) | ORAL | 3 refills | Status: DC

## 2021-06-24 MED ORDER — AMLODIPINE BESYLATE 2.5 MG PO TABS: 2.5000 mg | ORAL_TABLET | Freq: Every day | ORAL | 3 refills | Status: DC

## 2021-06-24 MED ORDER — HEPARIN SOD (PORK) LOCK FLUSH 100 UNIT/ML IV SOLN
500.0000 [IU] | INTRAVENOUS | Status: DC | PRN
  Filled 2021-06-24: qty 5

## 2021-06-24 NOTE — Discharge Summary (Signed)
Physician Discharge Summary   Patient ID: Audrey Barron MRN: [id number] DOB/AGE: 71-Oct-1952 71 y.o.  Admit date: 06/19/2021 Discharge date: 06/24/2021  Primary Care Physician:  Marguerita Merles, MD   Recommendations for Outpatient Follow-up:  Follow up with PCP in 1-2 weeks Please obtain BMP in one week  Placed on salt tablets 1 g p.o. twice daily, fluid restriction 1200 cc daily.  Outpatient follow-up with [facility].  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   amLODipine 2.5 MG tablet Commonly known as: NORVASC Take 1 tablet (2.5 mg total) by mouth daily.   cyanocobalamin 1000 MCG tablet Take 1,000 mcg by mouth daily.   dexamethasone 2 MG tablet Commonly known as: DECADRON Take 1 tablet (2 mg total) by mouth daily. What changed:  medication strength how much to take   diphenhydrAMINE 50 MG capsule Commonly known as: BENADRYL Take 1 capsule (50 mg total) by mouth at bedtime as needed for sleep.   pantoprazole 40 MG tablet Commonly known as: PROTONIX Take 1 tablet (40 mg total) by mouth at bedtime.   sodium chloride 1 g tablet Take 1 tablet (1 g total) by mouth 2 (two) times daily with a meal.         Brief H and P: For complete details please refer to admission H and P, but in brief 70 year old female with history of CNS lymphoma,  dementia presented for initiation of induction therapy for CNS lymphoma.  She apparently had several months of gait disturbance, confusion and speech difficulty.  Work-up showed infiltrative tumor in the left hemisphere of her brain.  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  She was put on Decadron and discharged home.   BMET on admission showed hyponatremia with sodium of 125.  TRH was consulted for hyponatremia.  Patient was admitted from oncology office, 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  Lasix was discontinued, increased salt tablets with no significant improvement. -Nephrology was consulted, received 1 dose of tolvaptan 15 mg x 1 -Sodium 137 at the time of discharge. -Cleared by nephrology to be discharged home, recommended sodium tabs 1 g p.o. twice daily and strict fluid restriction 1200 cc in a day.  Outpatient follow-up with nephrology    Secondary adrenal insufficiency -Cortisol level less than 0.4, likely suppression from Decadron     Primary CNS lymphoma ([place]), with chronic mental status changes -Outpatient management per oncology, Dr Mickeal Skinner.     Essential hypertension -BP stable, continue amlodipine   Generalized debility -PT evaluation done, close to her baseline, no PT follow-up     Day of Discharge S: No acute complaints, pleasant and cooperative, sodium 137.  Looking forward to go home today.  BP (!) 154/107   Pulse 71   Temp 98 F (36.7 C) (Oral)   Resp 18   Ht 5\' 5"  (1.651 m)   Wt 56.6 kg   SpO2 99%   BMI 20.76 kg/m   Physical Exam: General: Alert and awake oriented x self, pleasant CVS: S1-S2 clear no murmur rubs or gallops Chest: clear to auscultation bilaterally, no wheezing rales or rhonchi Abdomen: soft nontender, nondistended, normal bowel  sounds Extremities: no cyanosis, clubbing or edema noted bilaterally Neuro: Cranial nerves II-XII intact, no focal neurological deficits    Get Medicines reviewed and adjusted: Please take all your medications with you for your next visit with your Primary MD  Please request your Primary MD to go over all hospital tests and procedure/radiological results at the follow up. Please ask your Primary MD to get all Hospital records sent to his/her office.  If you experience worsening of your admission symptoms, develop shortness of breath, life threatening emergency, suicidal or homicidal thoughts you must seek medical attention immediately by calling 911 or calling your MD immediately  if symptoms less severe.  You must read complete instructions/literature along with all the possible adverse reactions/side effects for all the Medicines you take and that have been prescribed to you. Take any new Medicines after you have completely understood and accept all the possible adverse reactions/side effects.   Do not drive when taking pain medications.   Do not take more than prescribed Pain, Sleep and Anxiety Medications  Special Instructions: If you have smoked or chewed Tobacco  in the last 2 yrs please stop smoking, stop any regular Alcohol  and or any Recreational drug use.  Wear Seat belts while driving.  Please note  You were cared for by a hospitalist during your hospital stay. Once you are discharged, your primary care physician will handle any further medical issues. Please note that NO REFILLS for any discharge medications will be authorized once you are discharged, as it is imperative that you return to your primary care physician (or establish a relationship with a primary care physician if you do not have one) for your aftercare needs so that they can reassess your need for medications and monitor your lab values.   The results of significant diagnostics from this hospitalization  (including imaging, microbiology, ancillary and laboratory) are listed below for reference.      Procedures/Studies:  No results found.    LAB RESULTS: Basic Metabolic Panel: Recent Labs  Lab 06/19/21 1049 06/20/21 0555 06/23/21 0430 06/23/21 1730 06/24/21 0539  NA 125*   < > 124* 133* 137  K 3.8   < > 4.2  --  4.2  CL 94*   < > 91*  --  102  CO2 25   < > 26  --  25  GLUCOSE 109*   < > 81  --  83  BUN 8   < > 10  --  18  CREATININE 0.54   < > 0.52  --  0.53  CALCIUM 8.7*   < > 8.7*  --  9.5  MG 1.8  --   --   --   --   PHOS 3.1  --   --   --   --    < > = values in this interval not displayed.   Liver Function Tests: Recent Labs  Lab 06/23/21 0430 06/24/21 0539  AST 49* 79*  ALT 70* 99*  ALKPHOS 33* 38  BILITOT 0.8 0.6  PROT 6.2* 7.3  ALBUMIN 3.3* 3.8   No results for input(s): LIPASE, AMYLASE in the last 168 hours. No results for input(s): AMMONIA in the last 168 hours. CBC: Recent Labs  Lab 06/23/21 0430 06/24/21 0539  WBC 8.7 8.6  NEUTROABS 5.3 4.9  HGB 13.2 15.2*  HCT 38.8 45.6  MCV 85.1 87.0  PLT 248 279   Cardiac Enzymes: No results for input(s): CKTOTAL, CKMB, CKMBINDEX, TROPONINI in the last 168 hours. BNP: Invalid input(s): POCBNP CBG: No results for input(s): GLUCAP in the last 168 hours.     Disposition and Follow-up: Discharge Instructions     Diet general   Complete by: As directed    Discharge instructions   Complete by: As directed    Please maintain fluid restriction 1200cc in a day   Increase activity slowly   Complete by: As directed         DISPOSITION: [place]     Marguerita Merles, MD Follow up in 2 week(s).   Specialty: Family Medicine Why: for hospital follow-up Contact information: [place] [address] [phone]         [facility]. Schedule an appointment as soon as possible for a visit in 2 week(s).    Specialty: Nephrology Why: for hospital follow-up, obtain labs BMET Contact information: [place] [address] [phone]                  Time coordinating discharge:  35 minutes  Signed:   Estill Cotta M.D. [facility] 06/24/2021, 11:59 AM

## 2021-06-24 NOTE — Progress Notes (Signed)
[facility] Progress Note  Subjective: Na 135 after one dose tolvaptan yesterday  Vitals:   06/23/21 1343 06/23/21 2035 06/24/21 0500 06/24/21 0528  BP: (!) 130/94 (!) 111/97  (!) 154/107  Pulse: 88 95  71  Resp: 20 18  18   Temp: 98.4 F (36.9 C) (!) 97.4 F (36.3 C)  98 F (36.7 C)  TempSrc: Oral Oral  Oral  SpO2: 98% 96%  99%  Weight:   56.6 kg   Height:        Exam:  alert, nad   no jvd  Chest cta bilat  Cor reg no RG  Abd soft ntnd no ascites   Ext no LE edema   Alert, NF, Ox 1.5,  poor memory            Urine Na 81, Uosm 288    UA negative     Assessment/ Plan: Hyponatremia - in pt w/ known CNS lymphoma, undergoing chemoRx. Pt is euvolemic exam.  Cortisol is very low due to decadron Rx. 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  Na was stuck around 124- 126 w/ fluid restriction and salt tabs so pt was given 1 dose po tolvaptan on 7/1. Today Na is up to 135.  [place] for [employer]. 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  If needs renal input, feel free to let us know.  Would dc on low dose salt tabs 1 gm bid and fluid restriction to 1200 cc (or 1 quart). Will sign off.  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    Inpatient medications:  acetaminophen  500 mg Oral QHS   amLODipine  2.5 mg Oral Daily   Chlorhexidine Gluconate Cloth  6 each Topical Daily   dexamethasone  4 mg Oral Daily   enoxaparin (LOVENOX) injection  40 mg Subcutaneous Daily   sodium chloride  1 g Oral BID WC   cyanocobalamin  1,000 mcg Oral Daily     ondansetron (ZOFRAN) IV     acetaminophen, diphenhydrAMINE, heparin lock flush, ondansetron **OR** ondansetron **OR** ondansetron (ZOFRAN) IV **OR** ondansetron (ZOFRAN) IV, senna-docusate

## 2021-06-28 LAB — METHOTREXATE: Methotrexate: <0.05

## 2021-07-05 ENCOUNTER — Other Ambulatory Visit: Payer: Self-pay

## 2021-07-05 ENCOUNTER — Ambulatory Visit
Admission: RE | Admit: 2021-07-05 | Discharge: 2021-07-05 | Disposition: A | Discharge status: Home / Self Care | Payer: Medicare HMO | Source: Ambulatory Visit | Attending: Internal Medicine | Admitting: Internal Medicine

## 2021-07-05 DIAGNOSIS — C8589 Other specified types of non-Hodgkin lymphoma, extranodal and solid organ sites: Secondary | ICD-10-CM | POA: Insufficient documentation

## 2021-07-05 IMAGING — MR MR HEAD WO/W CM
8 of 14 series · 31 of 48 positions shown · IV contrast (5ml Gadavist)
Comparison: [DATE]

CLINICAL DATA: Primary CNS lymphoma.  Follow-up.

EXAM:
MRI HEAD WITHOUT AND WITH CONTRAST
TECHNIQUE: Multiplanar, multiecho pulse sequences of the brain and surrounding
structures were obtained without and with intravenous contrast.
CONTRAST:  5mL GADAVIST GADOBUTROL 1 MMOL/ML IV SOLN

[Series 5: T2 · axial · 5.0mm · 0.53mm/px · 1 of 25 slices shown (1 of 3)]
[im 1/25]
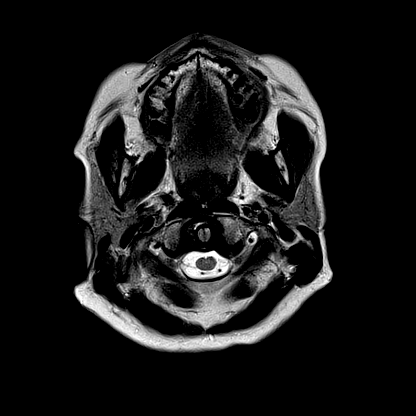

[Series 10: FLAIR · axial · 3.0mm · 0.53mm/px · z∈[-125,+37]mm · 3 of 55 slices shown]
[im 1/55]
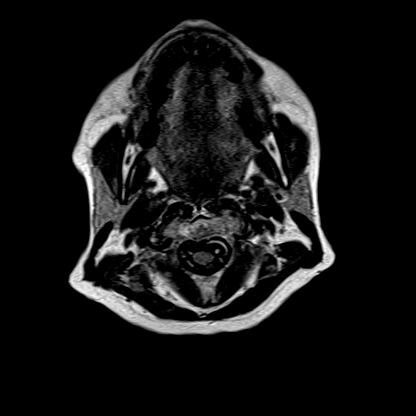
[im 28/55]
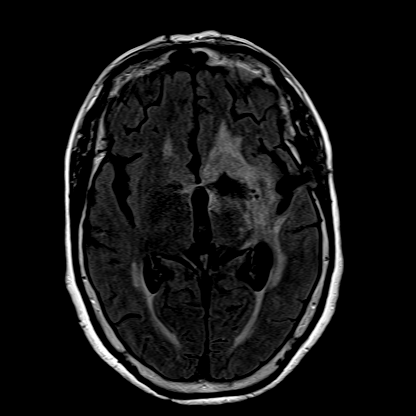
[im 55/55]
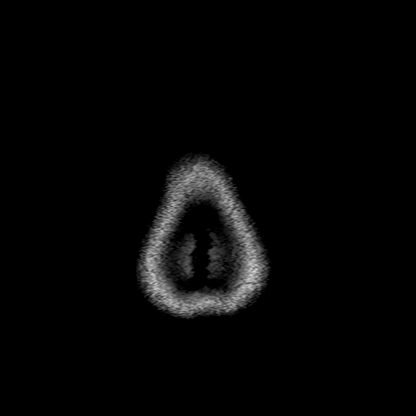

[Series 11: T2 · sagittal · non-contrast · 1.0mm · 0.98mm/px · 8 of 176 slices shown (2 of 3)]
[im 1/176]
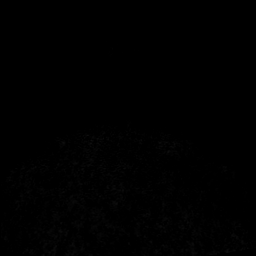
[im 26/176]
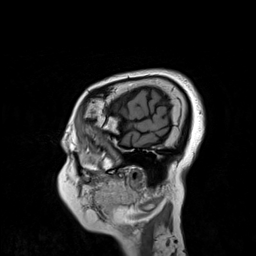
[im 51/176]
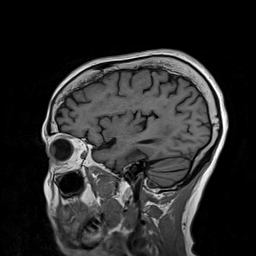
[im 76/176]
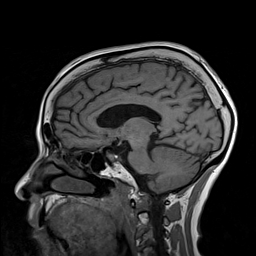
[im 101/176]
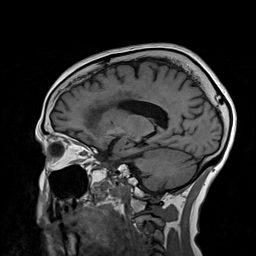
[im 126/176]
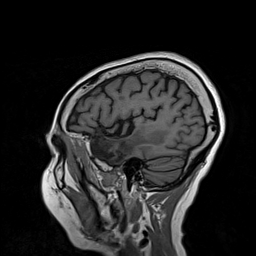
[im 151/176]
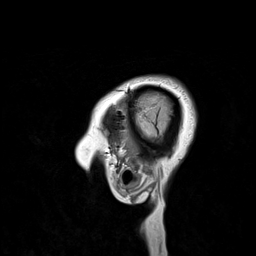
[im 176/176]
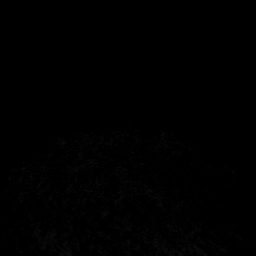

[Series 12: T2 post-contrast · coronal · 3.0mm · 0.57mm/px · 2 of 47 slices shown (1 of 2)]
[im 1/47]
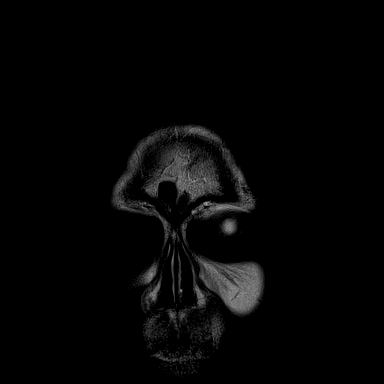
[im 47/47]
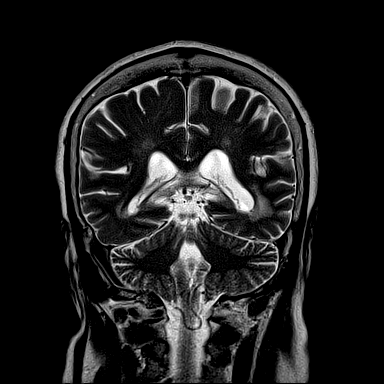

[Series 13: T2 post-contrast · sagittal · 1.0mm · 0.98mm/px · 8 of 176 slices shown (2 of 2)]
[im 1/176]
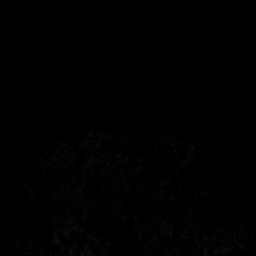
[im 26/176]
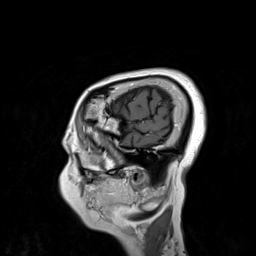
[im 51/176]
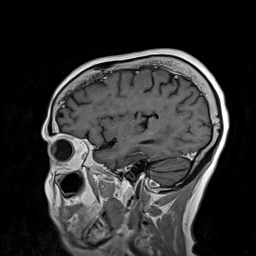
[im 76/176]
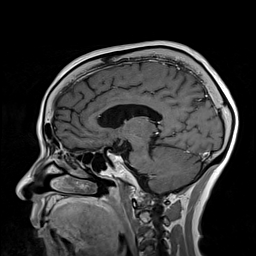
[im 101/176]
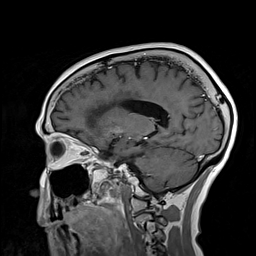
[im 126/176]
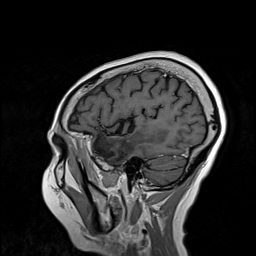
[im 151/176]
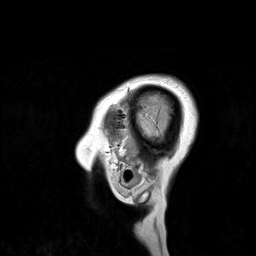
[im 176/176]
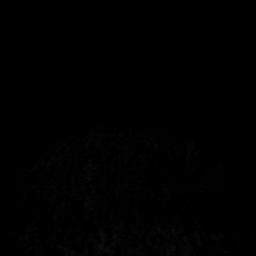

[Series 14: T1 post-contrast · coronal · 3.0mm · 0.29mm/px · 2 of 47 slices shown (1 of 2)]
[im 1/47]
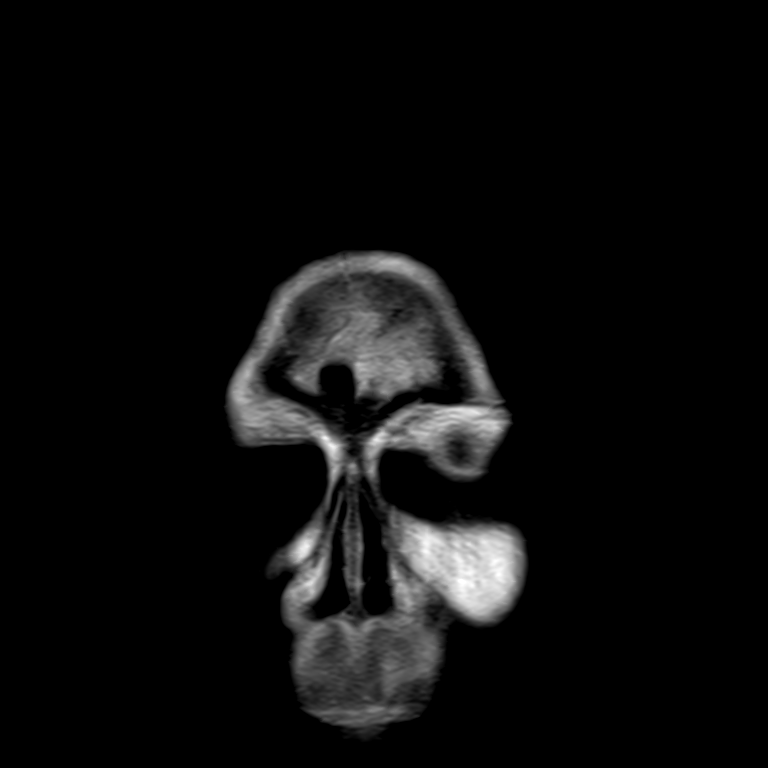
[im 47/47]
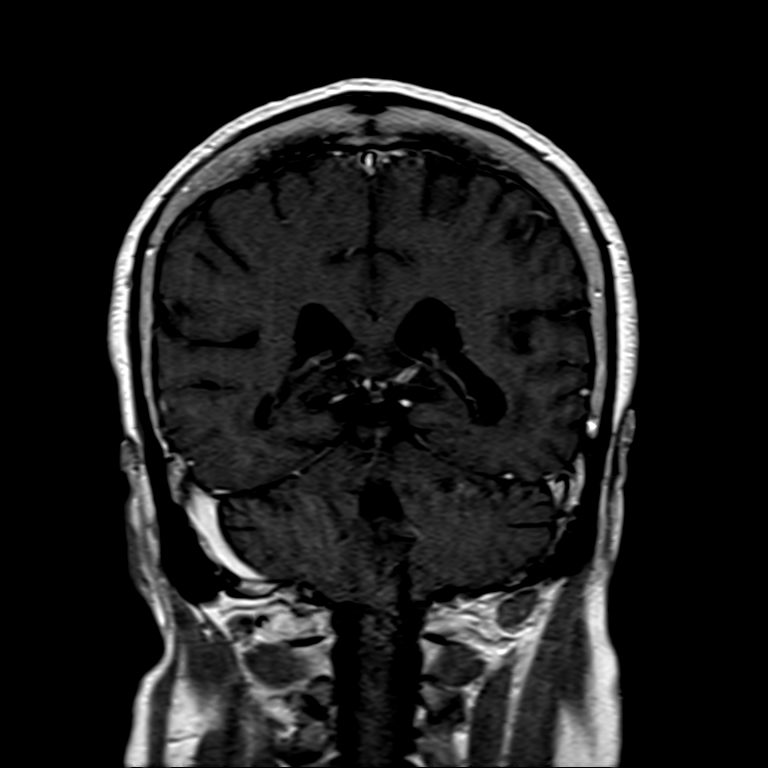

[Series 15: T1 post-contrast · sagittal · 3.0mm · 0.38mm/px · 2 of 35 slices shown (2 of 2)]
[im 1/35]
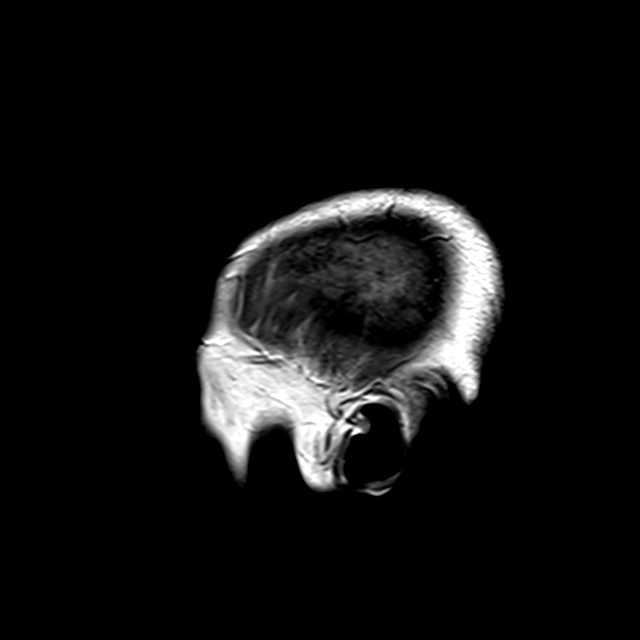
[im 35/35]
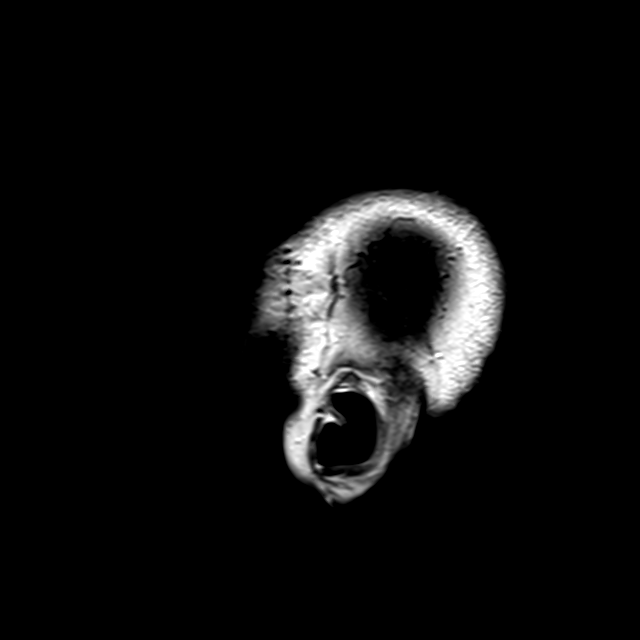

[Series 1005: T2 · axial · 1.0mm · 0.49mm/px · z∈[-139,-40]mm · 5 of 181 slices shown (3 of 3)]
[im 1/181]
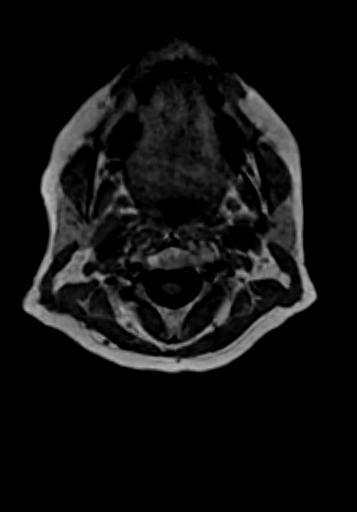
[im 26/181]
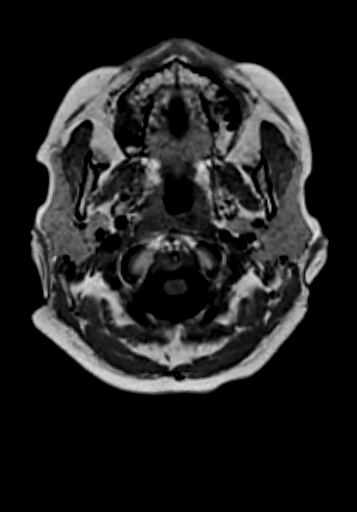
[im 52/181]
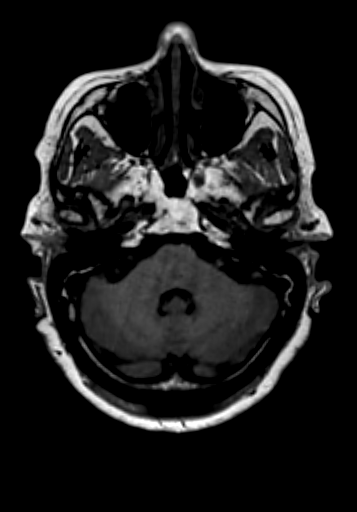
[im 78/181]
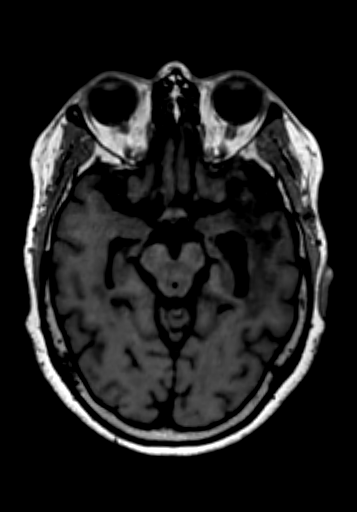
[im 103/181]
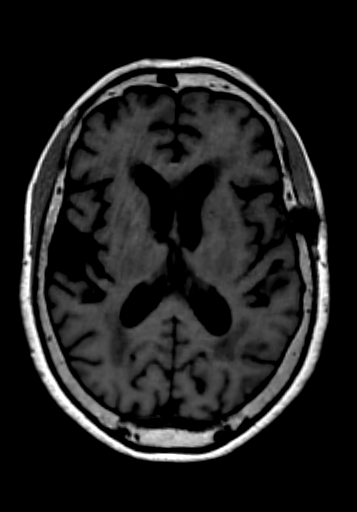

[31 of 48 positions shown; findings below may reference images not displayed]

FINDINGS: Brain: Marked regression of the patient's left hemispheric mass with
resolved mass effect and now volume loss seen in the left temporal
lobe where there has been interval surgery. There is a small focus
of indistinct nodular enhancement the anterior left temporal
lobe/lower sylvian fissure measuring 7 mm. This area shows an island
of T2 hypointensity relative to adjacent gliosis, indeterminate. No
contributory findings on diffusion imaging.

Intrinsic T1 hyperintensity attributed to mineralization in the
basal ganglia and midbrain.

No infarct, hydrocephalus, or collection. Background chronic small
vessel ischemia in the hemispheric white matter.

Vascular: Normal flow voids

Skull and upper cervical spine: Unremarkable craniotomy

Sinuses/Orbits: Negative
IMPRESSION: Markedly positive response to therapy with resolved mass effect.
There is a 7 mm focus of nodular enhancement at the lower left
sylvian fissure/anterior temporal lobe which is indeterminate.

## 2021-07-05 MED ORDER — GADOBUTROL 1 MMOL/ML IV SOLN
5.0000 mL | Freq: Once | INTRAVENOUS | Status: AC | PRN
  Administered 2021-07-05: 5 mL via INTRAVENOUS

## 2021-07-07 ENCOUNTER — Other Ambulatory Visit: Payer: Self-pay

## 2021-07-07 ENCOUNTER — Inpatient Hospital Stay (HOSPITAL_BASED_OUTPATIENT_CLINIC_OR_DEPARTMENT_OTHER): Payer: Medicare HMO | Admitting: Internal Medicine

## 2021-07-07 ENCOUNTER — Inpatient Hospital Stay: Payer: Medicare HMO | Attending: Internal Medicine

## 2021-07-07 VITALS — BP 125/78 | HR 86 | Temp 97.6°F | Resp 18

## 2021-07-07 DIAGNOSIS — Z5112 Encounter for antineoplastic immunotherapy: Secondary | ICD-10-CM | POA: Diagnosis present

## 2021-07-07 DIAGNOSIS — E871 Hypo-osmolality and hyponatremia: Secondary | ICD-10-CM | POA: Diagnosis not present

## 2021-07-07 DIAGNOSIS — C8589 Other specified types of non-Hodgkin lymphoma, extranodal and solid organ sites: Secondary | ICD-10-CM

## 2021-07-07 DIAGNOSIS — R768 Other specified abnormal immunological findings in serum: Secondary | ICD-10-CM

## 2021-07-07 DIAGNOSIS — C8519 Unspecified B-cell lymphoma, extranodal and solid organ sites: Secondary | ICD-10-CM | POA: Diagnosis present

## 2021-07-07 LAB — CBC WITH DIFFERENTIAL/PLATELET
Abs Immature Granulocytes: 0.34 10*3/uL — ABNORMAL HIGH (ref 0.00–0.07)
Basophils Absolute: 0.1 10*3/uL (ref 0.0–0.1)
Basophils Relative: 1 %
Eosinophils Absolute: 0.1 10*3/uL (ref 0.0–0.5)
Eosinophils Relative: 1 %
HCT: 39.1 % (ref 36.0–46.0)
Hemoglobin: 13.4 g/dL (ref 12.0–15.0)
Immature Granulocytes: 4 %
Lymphocytes Relative: 15 %
Lymphs Abs: 1.3 10*3/uL (ref 0.7–4.0)
MCH: 29.5 pg (ref 26.0–34.0)
MCHC: 34.3 g/dL (ref 30.0–36.0)
MCV: 85.9 fL (ref 80.0–100.0)
Monocytes Absolute: 0.6 10*3/uL (ref 0.1–1.0)
Monocytes Relative: 7 %
Neutro Abs: 5.9 10*3/uL (ref 1.7–7.7)
Neutrophils Relative %: 72 %
Platelets: 167 10*3/uL (ref 150–400)
RBC: 4.55 MIL/uL (ref 3.87–5.11)
RDW: 14.4 % (ref 11.5–15.5)
WBC: 8.2 10*3/uL (ref 4.0–10.5)
nRBC: 0 % (ref 0.0–0.2)

## 2021-07-07 LAB — COMPREHENSIVE METABOLIC PANEL
ALT: 108 U/L — ABNORMAL HIGH (ref 0–44)
AST: 74 U/L — ABNORMAL HIGH (ref 15–41)
Albumin: 3.4 g/dL — ABNORMAL LOW (ref 3.5–5.0)
Alkaline Phosphatase: 35 U/L — ABNORMAL LOW (ref 38–126)
Anion gap: 11 (ref 5–15)
BUN: 10 mg/dL (ref 8–23)
CO2: 20 mmol/L — ABNORMAL LOW (ref 22–32)
Calcium: 8.4 mg/dL — ABNORMAL LOW (ref 8.9–10.3)
Chloride: 92 mmol/L — ABNORMAL LOW (ref 98–111)
Creatinine, Ser: 0.51 mg/dL (ref 0.44–1.00)
GFR, Estimated: >60 mL/min (ref >60)
Glucose, Bld: 145 mg/dL — ABNORMAL HIGH (ref 70–99)
Potassium: 3.7 mmol/L (ref 3.5–5.1)
Sodium: 123 mmol/L — ABNORMAL LOW (ref 135–145)
Total Bilirubin: 0.8 mg/dL (ref 0.3–1.2)
Total Protein: 6.3 g/dL — ABNORMAL LOW (ref 6.5–8.1)

## 2021-07-07 MED ORDER — DEXAMETHASONE 1 MG PO TABS: 1.0000 mg | ORAL_TABLET | Freq: Every day | ORAL | 0 refills | Status: AC

## 2021-07-07 NOTE — Progress Notes (Signed)
[facility] [place], [place] [address] [phone]   Interval Evaluation  Date of Service: 07/07/21 Patient Name: Audrey Barron Patient MRN: [id number] Patient DOB: 06-Jan-1951 Provider: Ventura Sellers, MD  Identifying Statement:  Audrey Barron is a 70 y.o. female with  left hemispheric   Primary CNS Lymphoma    Oncologic History: Oncology History  Primary CNS lymphoma ([place])  04/28/2021 Surgery   Open biopsy with Dr. 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      Biomarkers:  CD20 positive .  Bcl-6 positive  Ki-67 95%      Interval History:  Audrey Barron presents today for follow up after recent chemotherapy, hospitalization for hyponatremia.  She and her daughter actually describe no further changes with regards to weakness and language impairment.  She continues to walk on her own throughout the home.  For ADLs, she is independent with feeding and toileting but requires assistance for all other activities.  Denies seizures, headaches.  No new deficits are described.  Continues on salt tabs twice per day, decadron 67m daily.  H+P (05/05/21) Patient presented to medical attention in April, with 2-3 months history of confusion, impaired speech, balance and gait impairment.  Neurologist ordered an MRI, which demonstrated infiltrative tumor within much of left hemisphere.  She underwent craniotomy and open [facility]28[facility]21 [facility] which yielded a B-cell CNS Lymphoma.  Following surgery, she has continued to experience difficulty speaking, weakness on the right side, and  poor balance.  She is unable to walk without assistance at home.  At present she is reliant on her daughter for help with dressing, cooking, tolieting; a majority of activities of daily living.  She has been dosing decadron 460mthree times per day; this has not clearly led to any improvements in functional status.   Medications: Current Outpatient Medications on File Prior to Visit  Medication Sig Dispense Refill   acetaminophen (TYLENOL) 500 MG tablet Take 500 mg by mouth at bedtime.     amLODipine (NORVASC) 2.5 MG tablet Take 1 tablet (2.5 mg total) by mouth daily. 30 tablet 3   cyanocobalamin 1000 MCG tablet Take 1,000 mcg by mouth daily.     diphenhydrAMINE (BENADRYL) 50 MG capsule Take 1 capsule (50 mg total) by mouth at bedtime as needed for sleep. 30 capsule 0   pantoprazole (PROTONIX) 40 MG tablet Take 1 tablet (40 mg total) by mouth at bedtime. 30 tablet 2   sodium chloride 1 g tablet Take 1 tablet (1 g total) by mouth 2 (two) times daily with a meal. 60 tablet 3   No current facility-administered medications on file prior to visit.    Allergies: No Known Allergies Past Medical History:  Past Medical History:  Diagnosis Date   Cancer ([facility]   lymphoma   D[place] ([facility]   Past Surgical History:  Past Surgical History:  Procedure Laterality Date   APPLICATION OF CRANIAL NAVIGATION N/A 04/28/2021   Procedure: APPLICATION OF CRANIAL NAVIGATION;  Surgeon: YaMeade MawMD;  Location: [facility] ORS;  Service: Neurosurgery;  Laterality: N/A;   CRANIOTOMY Left 04/28/2021  Procedure: CRANIOTOMY TUMOR EXCISION;  Surgeon: Meade Maw, MD;  Location: [facility] ORS;  Service: Neurosurgery;  Laterality: Left;   IR IMAGING GUIDED PORT INSERTION  05/12/2021   Social History:  Social History   Socioeconomic History   Marital status: Single    Spouse name: Not on file   Number of children: Not on file   Years of education: Not on file   Highest education level: Not on file   Occupational History   Not on file  Tobacco Use   Smoking status: Never   Smokeless tobacco: Never  Substance and Sexual Activity   Alcohol use: Not Currently   Drug use: Not Currently   Sexual activity: Not on file  Other Topics Concern   Not on file  Social History Narrative   Not on file   Social Determinants of Health   Financial Resource Strain: Not on file  Food Insecurity: Not on file  Transportation Needs: Not on file  Physical Activity: Not on file  Stress: Not on file  Social Connections: Not on file  Intimate Partner Violence: Not on file   Family History: No family history on file.  Review of Systems: Constitutional: Doesn't report fevers, chills or abnormal weight loss Eyes: Doesn't report blurriness of vision Ears, nose, mouth, throat, and face: Doesn't report sore throat Respiratory: Doesn't report cough, dyspnea or wheezes Cardiovascular: Doesn't report palpitation, chest discomfort  Gastrointestinal: +constipation GU: Doesn't report incontinence Skin: Doesn't report skin rashes Neurological: Per HPI Musculoskeletal: Doesn't report joint pain Behavioral/Psych: Doesn't report anxiety  Physical Exam: Vitals:   07/07/21 1022  BP: 125/78  Pulse: 86  Resp: 18  Temp: 97.6 F (36.4 C)  SpO2: 99%   KPS: 60. General: Alert, cooperative, pleasant, in no acute distress Head: Normal EENT: No conjunctival injection or scleral icterus.  Lungs: Resp effort normal Cardiac: Regular rate Abdomen: Non-distended abdomen Skin: No rashes cyanosis or petechiae. Extremities: No clubbing or edema  Neurologic Exam: Mental Status: Awake, alert, attentive to examiner. Oriented to self and environment. Language is mildly impaired with regards to both fluency and comprehension.  Further mental status evaluation limited by dysphasia. Cranial Nerves: Visual acuity is grossly normal. Visual fields are full. Extra-ocular movements intact. No ptosis. Face is  symmetric Motor: Tone and bulk are normal. Power is 4+/5 in right arm and leg. Reflexes are symmetric, no pathologic reflexes present.  Sensory: Intact to light touch Gait: Dystaxic   Labs: I have reviewed the data as listed    Component Value Date/Time   NA 123 (L) 07/07/2021 0938   K 3.7 07/07/2021 0938   CL 92 (L) 07/07/2021 0938   CO2 20 (L) 07/07/2021 0938   GLUCOSE 145 (H) 07/07/2021 0938   BUN 10 07/07/2021 0938   CREATININE 0.51 07/07/2021 0938   CALCIUM 8.4 (L) 07/07/2021 0938   PROT 6.3 (L) 07/07/2021 0938   ALBUMIN 3.4 (L) 07/07/2021 0938   AST 74 (H) 07/07/2021 0938   ALT 108 (H) 07/07/2021 0938   ALKPHOS 35 (L) 07/07/2021 0938   BILITOT 0.8 07/07/2021 0938   GFRNONAA >60 07/07/2021 0938   Lab Results  Component Value Date   WBC 8.2 07/07/2021   NEUTROABS 5.9 07/07/2021   HGB 13.4 07/07/2021   HCT 39.1 07/07/2021   MCV 85.9 07/07/2021   PLT 167 07/07/2021   Imaging:  [place] Clinician Interpretation: I have personally reviewed the CNS images as listed.  My interpretation, in the context of the patient's clinical presentation, is stable  disease  MR Brain W Wo Contrast  Result Date: 07/06/2021 CLINICAL DATA:  Primary CNS lymphoma.  Follow-up. EXAM: MRI HEAD WITHOUT AND WITH CONTRAST TECHNIQUE: Multiplanar, multiecho pulse sequences of the brain and surrounding structures were obtained without and with intravenous contrast. CONTRAST:  71m GADAVIST GADOBUTROL 1 MMOL/ML IV SOLN COMPARISON:  04/27/2021 FINDINGS: Brain: Marked regression of the patient's left hemispheric mass with resolved mass effect and now volume loss seen in the left temporal lobe where there has been interval surgery. There is a small focus of indistinct nodular enhancement the anterior left temporal lobe/lower sylvian fissure measuring 7 mm. This area shows an [place] T2 hypointensity relative to adjacent gliosis, indeterminate. No contributory findings on diffusion imaging. Intrinsic T1  hyperintensity attributed to mineralization in the basal ganglia and midbrain. No infarct, hydrocephalus, or collection. Background chronic small vessel ischemia in the hemispheric white matter. Vascular: Normal flow voids Skull and upper cervical spine: Unremarkable craniotomy Sinuses/Orbits: Negative IMPRESSION: Markedly positive response to therapy with resolved mass effect. There is a 7 mm focus of nodular enhancement at the lower left sylvian fissure/anterior temporal lobe which is indeterminate. Electronically Signed   By: JMonte FantasiaM.D.   On: 07/06/2021 21:12     Assessment/Plan Hepatitis C antibody positive in blood [R76.8]  BUzma Hellmeris clinically stable today, now having completed two partial cycles of HD-MTX and rituximab.  Her brain MRI demonstrates a marked positive response to therapy thus far.    We are not comfortable with advancing into further high dose methotrexate or cytotoxic chemotherapy because of degree of refractory hyponatremia.  During admission, sodium levels were unresponsive to fluid restriction, salt tablets, and only improved with Tolvaptan therapy.  She will be visiting with nephrology next month to assess candidacy for longer term tolvaptan (or similar) therapy.    We are, however, ok with continuing Rituximab component in the interim for tumor control.  We will recheck Hep C viral load at next infusion, which we will schedule for two weeks from today.  We encouraged increasing salt tabs to 2g BID, and counseled on fluid restriction parameters for SIADH.   Decadron should be decreased to 119mdaily x7 days, then stopped.  We ask that BaJunita Pusheturn to clinic in 2 weeks for rituximab infusion, or sooner if needed.  All questions were answered. The patient knows to call the clinic with any problems, questions or concerns. No barriers to learning were detected.  The total time spent in the encounter was 40 minutes and more than 50% was on  counseling and review of test results   ZaVentura SellersMD Medical Director of Neuro-Oncology [facility]7/15/22 11:10 AM

## 2021-07-12 DIAGNOSIS — R200 Anesthesia of skin: Secondary | ICD-10-CM | POA: Insufficient documentation

## 2021-07-12 DIAGNOSIS — R413 Other amnesia: Secondary | ICD-10-CM | POA: Insufficient documentation

## 2021-07-12 DIAGNOSIS — R296 Repeated falls: Secondary | ICD-10-CM | POA: Insufficient documentation

## 2021-07-12 DIAGNOSIS — R262 Difficulty in walking, not elsewhere classified: Secondary | ICD-10-CM | POA: Insufficient documentation

## 2021-07-13 ENCOUNTER — Telehealth: Payer: Self-pay | Admitting: *Deleted

## 2021-07-13 NOTE — Telephone Encounter (Signed)
Received request from [place] from Rudean Hitt, CAP-DA Case Manager requesting records to be faxed to [phone] to help determine if patient would be eligible for services.  Faxed.

## 2021-07-21 ENCOUNTER — Other Ambulatory Visit: Payer: Self-pay

## 2021-07-21 ENCOUNTER — Inpatient Hospital Stay: Payer: Medicare HMO

## 2021-07-21 ENCOUNTER — Encounter: Payer: Self-pay | Admitting: Internal Medicine

## 2021-07-21 ENCOUNTER — Inpatient Hospital Stay (HOSPITAL_BASED_OUTPATIENT_CLINIC_OR_DEPARTMENT_OTHER): Payer: Medicare HMO | Admitting: Internal Medicine

## 2021-07-21 VITALS — BP 135/96 | HR 91 | Temp 97.6°F | Resp 18

## 2021-07-21 VITALS — BP 106/83 | HR 109 | Temp 98.4°F | Resp 18 | Wt 132.0 lb

## 2021-07-21 DIAGNOSIS — R768 Other specified abnormal immunological findings in serum: Secondary | ICD-10-CM

## 2021-07-21 DIAGNOSIS — Z5112 Encounter for antineoplastic immunotherapy: Secondary | ICD-10-CM | POA: Diagnosis not present

## 2021-07-21 DIAGNOSIS — C8589 Other specified types of non-Hodgkin lymphoma, extranodal and solid organ sites: Secondary | ICD-10-CM | POA: Diagnosis not present

## 2021-07-21 LAB — CBC WITH DIFFERENTIAL/PLATELET
Abs Immature Granulocytes: 0.09 10*3/uL — ABNORMAL HIGH (ref 0.00–0.07)
Basophils Absolute: 0 10*3/uL (ref 0.0–0.1)
Basophils Relative: 1 %
Eosinophils Absolute: 0.1 10*3/uL (ref 0.0–0.5)
Eosinophils Relative: 3 %
HCT: 40.7 % (ref 36.0–46.0)
Hemoglobin: 13.8 g/dL (ref 12.0–15.0)
Immature Granulocytes: 2 %
Lymphocytes Relative: 32 %
Lymphs Abs: 1.7 10*3/uL (ref 0.7–4.0)
MCH: 29.5 pg (ref 26.0–34.0)
MCHC: 33.9 g/dL (ref 30.0–36.0)
MCV: 87 fL (ref 80.0–100.0)
Monocytes Absolute: 0.8 10*3/uL (ref 0.1–1.0)
Monocytes Relative: 16 %
Neutro Abs: 2.4 10*3/uL (ref 1.7–7.7)
Neutrophils Relative %: 46 %
Platelets: 198 10*3/uL (ref 150–400)
RBC: 4.68 MIL/uL (ref 3.87–5.11)
RDW: 14.6 % (ref 11.5–15.5)
WBC: 5.2 10*3/uL (ref 4.0–10.5)
nRBC: 0 % (ref 0.0–0.2)

## 2021-07-21 LAB — COMPREHENSIVE METABOLIC PANEL
ALT: 59 U/L — ABNORMAL HIGH (ref 0–44)
AST: 82 U/L — ABNORMAL HIGH (ref 15–41)
Albumin: 3.2 g/dL — ABNORMAL LOW (ref 3.5–5.0)
Alkaline Phosphatase: 48 U/L (ref 38–126)
Anion gap: 9 (ref 5–15)
BUN: 6 mg/dL — ABNORMAL LOW (ref 8–23)
CO2: 22 mmol/L (ref 22–32)
Calcium: 8.6 mg/dL — ABNORMAL LOW (ref 8.9–10.3)
Chloride: 97 mmol/L — ABNORMAL LOW (ref 98–111)
Creatinine, Ser: 0.39 mg/dL — ABNORMAL LOW (ref 0.44–1.00)
GFR, Estimated: >60 mL/min (ref >60)
Glucose, Bld: 94 mg/dL (ref 70–99)
Potassium: 4.2 mmol/L (ref 3.5–5.1)
Sodium: 128 mmol/L — ABNORMAL LOW (ref 135–145)
Total Bilirubin: 0.5 mg/dL (ref 0.3–1.2)
Total Protein: 6.2 g/dL — ABNORMAL LOW (ref 6.5–8.1)

## 2021-07-21 MED ORDER — SODIUM CHLORIDE 0.9 % IV SOLN: 375.0000 mg/m2 | Freq: Once | INTRAVENOUS | Status: DC

## 2021-07-21 MED ORDER — SODIUM CHLORIDE 0.9 % IV SOLN
375.0000 mg/m2 | Freq: Once | INTRAVENOUS | Status: AC
  Administered 2021-07-21: 600 mg via INTRAVENOUS
  Filled 2021-07-21: qty 50

## 2021-07-21 MED ORDER — HEPARIN SOD (PORK) LOCK FLUSH 100 UNIT/ML IV SOLN
INTRAVENOUS | Status: AC
  Filled 2021-07-21: qty 5

## 2021-07-21 MED ORDER — HEPARIN SOD (PORK) LOCK FLUSH 100 UNIT/ML IV SOLN
500.0000 [IU] | Freq: Once | INTRAVENOUS | Status: AC | PRN
  Administered 2021-07-21: 500 [IU]
  Filled 2021-07-21: qty 5

## 2021-07-21 MED ORDER — ACETAMINOPHEN 325 MG PO TABS
650.0000 mg | ORAL_TABLET | Freq: Once | ORAL | Status: AC
  Administered 2021-07-21: 650 mg via ORAL
  Filled 2021-07-21: qty 2

## 2021-07-21 MED ORDER — DIPHENHYDRAMINE HCL 25 MG PO CAPS
50.0000 mg | ORAL_CAPSULE | Freq: Once | ORAL | Status: AC
  Administered 2021-07-21: 50 mg via ORAL
  Filled 2021-07-21: qty 2

## 2021-07-21 MED ORDER — SODIUM CHLORIDE 0.9 % IV SOLN
Freq: Once | INTRAVENOUS | Status: AC
  Filled 2021-07-21: qty 250

## 2021-07-21 NOTE — Progress Notes (Signed)
Patient here for neuro follow-up appointment, no new concerns at this time.

## 2021-07-21 NOTE — Patient Instructions (Signed)
[facility]  Discharge Instructions: Thank you for choosing [facility] to provide your oncology and hematology care.  If you have a lab appointment with the [facility], please go directly to the [facility] and check in at the registration area.  Wear comfortable clothing and clothing appropriate for easy access to any Portacath or PICC line.   We strive to give you quality time with your provider. You may need to reschedule your appointment if you arrive late (15 or more minutes).  Arriving late affects you and other patients whose appointments are after yours.  Also, if you miss three or more appointments without notifying the office, you may be dismissed from the clinic at the provider's discretion.      For prescription refill requests, have your pharmacy contact our office and allow 72 hours for refills to be completed.    Today you received the following chemotherapy and/or immunotherapy agents Rituximab       To help prevent nausea and vomiting after your treatment, we encourage you to take your nausea medication as directed.  BELOW ARE SYMPTOMS THAT SHOULD BE REPORTED IMMEDIATELY: *FEVER GREATER THAN 100.4 F (38 C) OR HIGHER *CHILLS OR SWEATING *NAUSEA AND VOMITING THAT IS NOT CONTROLLED WITH YOUR NAUSEA MEDICATION *UNUSUAL SHORTNESS OF BREATH *UNUSUAL BRUISING OR BLEEDING *URINARY PROBLEMS (pain or burning when urinating, or frequent urination) *BOWEL PROBLEMS (unusual diarrhea, constipation, pain near the anus) TENDERNESS IN MOUTH AND THROAT WITH OR WITHOUT PRESENCE OF ULCERS (sore throat, sores in mouth, or a toothache) UNUSUAL RASH, SWELLING OR PAIN  UNUSUAL VAGINAL DISCHARGE OR ITCHING   Items with * indicate a potential emergency and should be followed up as soon as possible or go to the Emergency Department if any problems should occur.  Please show the CHEMOTHERAPY ALERT CARD or IMMUNOTHERAPY ALERT CARD at check-in  to the Emergency Department and triage nurse.  Should you have questions after your visit or need to cancel or reschedule your appointment, please contact [facility]  [phone] and follow the prompts.  Office hours are 8:00 a.m. to 4:30 p.m. Monday - Friday. Please note that voicemails left after 4:00 p.m. may not be returned until the following business day.  We are closed weekends and major holidays. You have access to a nurse at all times for urgent questions. Please call the main number to the clinic [phone] and follow the prompts.  For any non-urgent questions, you may also contact your provider using MyChart. We now offer e-Visits for anyone 36 and older to request care online for non-urgent symptoms. For details visit [email or web address].   Also download the MyChart app! Go to the app store, search "MyChart", open the app, select [facility], and log in with your MyChart username and password.  Due to Covid, a mask is required upon entering the hospital/clinic. If you do not have a mask, one will be given to you upon arrival. For doctor visits, patients may have 1 support person aged 65 or older with them. For treatment visits, patients cannot have anyone with them due to current Covid guidelines and our immunocompromised population.    Rituximab Injection What is this medication? RITUXIMAB (ri TUX i mab) is a monoclonal antibody. It is used to treat certain types of cancer like non-Hodgkin lymphoma and chronic lymphocytic leukemia. It is also used to treat rheumatoid arthritis, granulomatosis with polyangiitis,microscopic polyangiitis, and pemphigus vulgaris. This medicine may be used for other  purposes; ask your health care provider orpharmacist if you have questions. COMMON BRAND NAME(S): RIABNI, Rituxan, RUXIENCE What should I tell my care team before I take this medication? They need to know if you have any of these conditions: chest  pain heart disease infection especially a viral infection such as chickenpox, cold sores, hepatitis B, or herpes immune system problems irregular heartbeat or rhythm kidney disease low blood counts (white cells, platelets, or red cells) lung disease recent or upcoming vaccine an unusual or allergic reaction to rituximab, other medicines, foods, dyes, or preservatives pregnant or trying to get pregnant breast-feeding How should I use this medication? This medicine is injected into a vein. It is given by a health care provider ina hospital or clinic setting. A special MedGuide will be given to you before each treatment. Be sure to readthis information carefully each time. Talk to your health care provider about the use of this medicine in children. While this drug may be prescribed for children as young as 6 months forselected conditions, precautions do apply. Overdosage: If you think you have taken too much of this medicine contact apoison control center or emergency room at once. NOTE: This medicine is only for you. Do not share this medicine with others. What if I miss a dose? Keep appointments for follow-up doses. It is important not to miss your dose.Call your health care provider if you are unable to keep an appointment. What may interact with this medication? Do not take this medicine with any of the following medicines: live vaccines This medicine may also interact with the following medicines: cisplatin This list may not describe all possible interactions. Give your health care provider a list of all the medicines, herbs, non-prescription drugs, or dietary supplements you use. Also tell them if you smoke, drink alcohol, or use illegaldrugs. Some items may interact with your medicine. What should I watch for while using this medication? Your condition will be monitored carefully while you are receiving thismedicine. You may need blood work done while you are taking this  medicine. This medicine can cause serious infusion reactions. To reduce the risk your health care provider may give you other medicines to take before receiving thisone. Be sure to follow the directions from your health care provider. This medicine may increase your risk of getting an infection. Call your health care provider for advice if you get a fever, chills, sore throat, or other symptoms of a cold or flu. Do not treat yourself. Try to avoid being aroundpeople who are sick. Call your health care provider if you are around anyone with measles,chickenpox, or if you develop sores or blisters that do not heal properly. Avoid taking medicines that contain aspirin, acetaminophen, ibuprofen, naproxen, or ketoprofen unless instructed by your health care provider. Thesemedicines may hide a fever. This medicine may cause serious skin reactions. They can happen weeks to months after starting the medicine. Contact your health care provider right away if you notice fevers or flu-like symptoms with a rash. The rash may be red or purple and then turn into blisters or peeling of the skin. Or, you might notice a red rash with swelling of the face, lips or lymph nodes in your neck or underyour arms. In some patients, this medicine may cause a serious brain infection that may cause death. If you have any problems seeing, thinking, speaking, walking, or standing, tell your healthcare professional right away. If you cannot reachyour healthcare professional, urgently seek other source of medical care. Do  not become pregnant while taking this medicine or for at least 12 months after stopping it. Women should inform their health care provider if they wish to become pregnant or think they might be pregnant. There is potential for serious harm to an unborn child. Talk to your health care provider for more information. Women should use a reliable form of birth control while taking this medicine and for 12 months after stopping  it. Do not breast-feed whiletaking this medicine or for at least 6 months after stopping it. What side effects may I notice from receiving this medication? Side effects that you should report to your health care provider as soon aspossible: allergic reactions (skin rash, itching or hives; swelling of the face, lips, or tongue) diarrhea edema (sudden weight gain; swelling of the ankles, feet, hands or other unusual swelling; trouble breathing) fast, irregular heartbeat heart attack (trouble breathing; pain or tightness in the chest, neck, back or arms; unusually weak or tired) infection (fever, chills, cough, sore throat, pain or trouble passing urine) kidney injury (trouble passing urine or change in the amount of urine) liver injury (dark yellow or brown urine; general ill feeling or flu-like symptoms; loss of appetite, right upper belly pain; unusually weak or tired, yellowing of the eyes or skin) low blood pressure (dizziness; feeling faint or lightheaded, falls; unusually weak or tired) low red blood cell counts (trouble breathing; feeling faint; lightheaded, falls; unusually weak or tired) mouth sores redness, blistering, peeling, or loosening of the skin, including inside the mouth stomach pain unusual bruising or bleeding wheezing (trouble breathing with loud or whistling sounds) vomiting Side effects that usually do not require medical attention (report to yourhealth care provider if they continue or are bothersome): headache joint pain muscle cramps, pain nausea This list may not describe all possible side effects. Call your doctor for medical advice about side effects. You may report side effects to FDA at1-800-FDA-1088. Where should I keep my medication? This medicine is given in a hospital or clinic. It will not be stored at home. NOTE: This sheet is a summary. It may not cover all possible information. If you have questions about this medicine, talk to your doctor, pharmacist,  orhealth care provider.  2022 Elsevier/Gold Standard (2020-12-01 15:47:26)

## 2021-07-21 NOTE — Progress Notes (Signed)
Per neurologist, will obtain labs today. Can proceed without waiting for labs.  Patient received rituxan inpatient with no issues. Will change to rapid moving forward.

## 2021-07-21 NOTE — Progress Notes (Signed)
[facility] at [address] [place], [place] [address] [phone]   Interval Evaluation  Date of Service: 07/21/21 Patient Name: Audrey Barron Patient MRN: [id number] Patient DOB: Jun 07, 1951 Provider: Ventura Sellers, MD  Identifying Statement:  Audrey Barron is a 70 y.o. female with  left hemispheric   Primary CNS Lymphoma    Oncologic History: Oncology History  Primary CNS lymphoma ([facility])  04/28/2021 Surgery   Open biopsy with Dr. 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      Biomarkers:  CD20 positive .  Bcl-6 positive  Ki-67 95%      Interval History:  Audrey Barron presents today for follow up and Rituximab infusion.  They don't describe changes today with weakness and language impairment.  She continues to walk on her own throughout the home.  For ADLs, she is independent with feeding and toileting but requires assistance for all other activities.  Denies seizures, headaches.  No new deficits are described. Tolerating salt tabs well, decadron has been formally discontinued.   H+P (05/05/21) Patient presented to medical attention in April, with 2-3 months history of confusion, impaired speech, balance and gait impairment.  Neurologist ordered an MRI, which demonstrated infiltrative tumor within much of left hemisphere.  She underwent craniotomy and open biopsy on 04/28/21 with Dr. Izora Ribas at [facility], which yielded a B-cell CNS Lymphoma.  Following surgery, she has continued to experience difficulty speaking, weakness on the right side, and poor balance.  She is unable to walk without assistance at  home.  At present she is reliant on her daughter for help with dressing, cooking, tolieting; a majority of activities of daily living.  She has been dosing decadron 41m three times per day; this has not clearly led to any improvements in functional status.   Medications: Current Outpatient Medications on File Prior to Visit  Medication Sig Dispense Refill   acetaminophen (TYLENOL) 500 MG tablet Take 500 mg by mouth at bedtime.     amLODipine (NORVASC) 2.5 MG tablet Take 1 tablet (2.5 mg total) by mouth daily. 30 tablet 3   cyanocobalamin 1000 MCG tablet Take 1,000 mcg by mouth daily.     diphenhydrAMINE (BENADRYL) 50 MG capsule Take 1 capsule (50 mg total) by mouth at bedtime as needed for sleep. 30 capsule 0   sodium chloride 1 g tablet Take 1 tablet (1 g total) by mouth 2 (two) times daily with a meal. 60 tablet 3   pantoprazole (PROTONIX) 40 MG tablet Take 1 tablet (40 mg total) by mouth at bedtime. (Patient not taking: Reported on 07/21/2021) 30 tablet 2   No current facility-administered medications on file prior to visit.    Allergies: No Known Allergies Past Medical History:  Past Medical History:  Diagnosis Date   Cancer ([place]    lymphoma   [place]    Past Surgical History:  Past Surgical History:  Procedure Laterality Date   APPLICATION OF CRANIAL NAVIGATION N/A 04/28/2021   Procedure: APPLICATION OF CRANIAL NAVIGATION;  Surgeon: YMeade Maw MD;  Location: [facility] ORS;  Service: Neurosurgery;  Laterality: N/A;   CRANIOTOMY Left 04/28/2021   Procedure: CRANIOTOMY TUMOR EXCISION;  Surgeon: Meade Maw, MD;  Location: [facility] ORS;  Service: Neurosurgery;  Laterality: Left;   IR IMAGING GUIDED PORT INSERTION  05/12/2021   Social History:  Social History   Socioeconomic History   Marital status: Single    Spouse name: Not on file   Number of children: Not on file   Years of education: Not on file   Highest education level: Not on file  Occupational History    Not on file  Tobacco Use   Smoking status: Never   Smokeless tobacco: Never  Substance and Sexual Activity   Alcohol use: Not Currently   Drug use: Not Currently   Sexual activity: Not on file  Other Topics Concern   Not on file  Social History Narrative   Not on file   Social Determinants of Health   Financial Resource Strain: Not on file  Food Insecurity: Not on file  Transportation Needs: Not on file  Physical Activity: Not on file  Stress: Not on file  Social Connections: Not on file  Intimate Partner Violence: Not on file   Family History: History reviewed. No pertinent family history.  Review of Systems: Constitutional: Doesn't report fevers, chills or abnormal weight loss Eyes: Doesn't report blurriness of vision Ears, nose, mouth, throat, and face: Doesn't report sore throat Respiratory: Doesn't report cough, dyspnea or wheezes Cardiovascular: Doesn't report palpitation, chest discomfort  Gastrointestinal: +constipation GU: Doesn't report incontinence Skin: Doesn't report skin rashes Neurological: Per HPI Musculoskeletal: Doesn't report joint pain Behavioral/Psych: Doesn't report anxiety  Physical Exam: Vitals:   07/21/21 0943  BP: 106/83  Pulse: (!) 109  Resp: 18  Temp: 98.4 F (36.9 C)  SpO2: 98%   KPS: 60. General: Alert, cooperative, pleasant, in no acute distress Head: Normal EENT: No conjunctival injection or scleral icterus.  Lungs: Resp effort normal Cardiac: Regular rate Abdomen: Non-distended abdomen Skin: No rashes cyanosis or petechiae. Extremities: No clubbing or edema  Neurologic Exam: Mental Status: Awake, alert, attentive to examiner. Oriented to self and environment. Language is mildly impaired with regards to both fluency and comprehension.  Further mental status evaluation limited by dysphasia. Cranial Nerves: Visual acuity is grossly normal. Visual fields are full. Extra-ocular movements intact. No ptosis. Face is  symmetric Motor: Tone and bulk are normal. Power is 4+/5 in right arm and leg. Reflexes are symmetric, no pathologic reflexes present.  Sensory: Intact to light touch Gait: Dystaxic   Labs: I have reviewed the data as listed    Component Value Date/Time   NA 128 (L) 07/21/2021 1024   K 4.2 07/21/2021 1024   CL 97 (L) 07/21/2021 1024   CO2 22 07/21/2021 1024   GLUCOSE 94 07/21/2021 1024   BUN 6 (L) 07/21/2021 1024   CREATININE 0.39 (L) 07/21/2021 1024   CALCIUM 8.6 (L) 07/21/2021 1024   PROT 6.2 (L) 07/21/2021 1024   ALBUMIN 3.2 (L) 07/21/2021 1024   AST 82 (H) 07/21/2021 1024   ALT 59 (H) 07/21/2021 1024   ALKPHOS 48 07/21/2021 1024   BILITOT 0.5 07/21/2021 1024   GFRNONAA >60 07/21/2021 1024   Lab Results  Component Value Date   WBC 5.2 07/21/2021   NEUTROABS 2.4 07/21/2021   HGB 13.8 07/21/2021   HCT 40.7 07/21/2021   MCV 87.0 07/21/2021   PLT 198 07/21/2021     Assessment/Plan Primary CNS lymphoma ([place]) [C85.89]  Audrey Barron is clinically stable today, now having completed two partial cycles of HD-MTX and rituximab.    She is scheduled  for, and medically cleared for Rituximab infusion today.    We will con't these treatments every 2 weeks until SIADH can be more fully addressed, resolved through nephrology.  Salt tabs and fluid restriction have improved Na+ today up to 128 from 123 previously.  Once she is safe to receive high loads of hypotonic fluids, we can resume high dose MTX therapy in addition to rituximab.  Decadron will remain off-board for now.  We ask that Audrey Barron return to clinic in 2 weeks for next rituximab infusion, or sooner if needed.  All questions were answered. The patient knows to call the clinic with any problems, questions or concerns. No barriers to learning were detected.  The total time spent in the encounter was 30 minutes and more than 50% was on counseling and review of test results   Ventura Sellers, MD Medical  Director of Neuro-Oncology [facility] at [place] [facility] 07/21/21 11:41 AM

## 2021-07-25 ENCOUNTER — Telehealth: Payer: Self-pay | Admitting: Primary Care

## 2021-07-25 NOTE — Telephone Encounter (Signed)
Spoke with patient's daughter Pooja Krill, regarding the Palliative referral/services and all questions were answered and she was in agreement with scheduling visit.  I have scheduled an In-home Consult for 07/31/21 @ 3:15 PM

## 2021-07-29 LAB — HEPATITIS C GENOTYPE

## 2021-07-29 LAB — HCV RNA QUANT RFLX ULTRA OR GENOTYP
HCV RNA Qnt(log copy/mL): 6.742 log10 IU/mL
HepC Qn: 5520000 IU/mL

## 2021-07-31 ENCOUNTER — Other Ambulatory Visit: Payer: Medicare HMO | Admitting: Primary Care

## 2021-07-31 ENCOUNTER — Other Ambulatory Visit: Payer: Self-pay

## 2021-07-31 DIAGNOSIS — Z515 Encounter for palliative care: Secondary | ICD-10-CM

## 2021-07-31 DIAGNOSIS — C8589 Other specified types of non-Hodgkin lymphoma, extranodal and solid organ sites: Secondary | ICD-10-CM

## 2021-07-31 DIAGNOSIS — R262 Difficulty in walking, not elsewhere classified: Secondary | ICD-10-CM

## 2021-07-31 DIAGNOSIS — C8339 Primary central nervous system lymphoma: Secondary | ICD-10-CM

## 2021-07-31 DIAGNOSIS — R413 Other amnesia: Secondary | ICD-10-CM

## 2021-07-31 NOTE — Progress Notes (Signed)
[facility] Consult Note Telephone: [phone]  Fax: [phone]   Date of encounter: 07/31/21 PATIENT NAME: Audrey Barron [place] [address]   [phone] (home)  DOB: 08-26-1951 MRN: [id number] PRIMARY CARE PROVIDER:    Marguerita Merles, MD,  [place] [address] [phone]  REFERRING PROVIDER:   Wonda Cerise, PA-C [place],  [place] [address] [phone]  RESPONSIBLE PARTY:    Contact Information     Name Relation Home Work Mobile   Audrey Barron [place] Daughter [phone]  [phone]   Audrey Barron, Audrey Barron [phone]  [phone]   Audrey Barron, Audrey Barron Sister [phone]  [phone]       I met face to face with patient and family in home. Palliative Care was asked to follow this patient by consultation request of  Wonda Cerise, PA-C to address advance care planning and complex medical decision making. This is the initial visit.                                     ASSESSMENT AND PLAN / RECOMMENDATIONS:   Advance Care Planning/Goals of Care: Goals include to maximize quality of life and symptom management. Patient/health care surrogate gave his/her permission to discuss.Our advance care planning conversation included a discussion about:    The value and importance of advance care planning  Exploration of personal, cultural or spiritual beliefs that might influence medical decisions  Exploration of goals of care in the event of a sudden injury or illness  Identification of a healthcare agent - daughter Audrey Barron Review  of an  advance directive document- left for review CODE STATUS: Full at this time.  MOST reviewed and left for family to discuss. We will complete on next visit.  Symptom Management/Plan:  I met with patient and her daughter in their home. They outline patient's recent brain tumor diagnosis and subsequent treatment. She  had undergone a chemotherapy treatment but became extremely hyponatremic. Daughter is a Dietitian and reached out for the palliative referral. Patient is seen by oncology,  neurology and will have a nephrology consult regarding the sodium regulation. We discussed the MOST  form today and their desires for  treatment. Currently she is seeking treatments for this disease and is early in that process. They outlined that they were interested in disease- modulating treatments. Patient has had some memory issues, exacerbated by this tumor. Her ambulation is a  ataxic per her daughter's report,  but she does have a Rollator which she uses occasionally. She's just finished with physical therapy in the home.  I will continue to follow to establish her palliative services and will see them again in six weeks.    Follow up Palliative Care Visit: Palliative care will continue to follow for complex medical decision making, advance care planning, and clarification of goals. Return 6 weeks or prn.  I spent 45 minutes providing this consultation. More than 50% of the time in this consultation was spent in counseling and care coordination.  PPS: 40%  HOSPICE ELIGIBILITY/DIAGNOSIS: TBD  Chief Complaint: debility, advance care planning needs  HISTORY OF PRESENT ILLNESS:  Audrey Barron is a 70 y.o. year old female  with h/o memory loss, hyponatremia, brain tumor -B-cell CNS Lymphoma.  Had memory and gait deficits in April and sought intervention. Is now being Rx with chemotherapy, although has suffered hyponatremia, stalling treatments.  Has been referred to nephrology for consultation. Daughter has heard of palliative medicine and wanted to establish.   History obtained from review of EMR, discussion with primary team, and interview with family, facility staff/caregiver and/or Audrey Barron.  I reviewed available labs, medications, imaging, studies and related documents from the EMR.  Records reviewed and  summarized above.   ROS  General: NAD EYES: denies vision changes, has glasses  ENMT: denies dysphagia Cardiovascular: denies chest pain, denies DOE Pulmonary: denies cough, denies increased SOB Abdomen: endorses good appetite, denies constipation, endorses loose stools, endorses occ incontinence of bowel GU: denies dysuria, endorses  occ incontinence of urine MSK:  endorses some weakness,  no falls reported Skin: denies rashes or wounds Neurological: denies pain, denies insomnia, endorses some ataxia Psych: Endorses positive mood Heme/lymph/immuno: denies bruises, abnormal bleeding  Physical Exam: Current and past weights: 132 per recent Epic record, pt unable to report Constitutional: NAD, 125/95, HR 105 RR 20 General: frail appearing, thin EYES: anicteric sclera, lids intact, no discharge  ENMT: intact hearing, oral mucous membranes moist, dentition intact CV: S1S2, RRR, no LE edema Pulmonary: LCTA, no increased work of breathing, no cough, room air, PO2= 91% Abdomen: intake 100%,soft and non tender, no ascites GU: deferred MSK: ++ sarcopenia, moves all extremities, ambulatory with help Skin: warm and dry, no rashes or wounds on visible skin Neuro:  ++ generalized weakness,  ++ cognitive impairment Psych: non-anxious affect, A and O x 2-3 Hem/lymph/immuno: no widespread bruising CURRENT PROBLEM LIST:  Patient Active Problem List   Diagnosis Date Noted   Difficulty walking 07/12/2021   Falls frequently 07/12/2021   Left sided numbness 07/12/2021   Memory loss or impairment 07/12/2021   Essential hypertension 06/23/2021   Delirium due to another medical condition 05/01/2021   Hyponatremia    Primary CNS lymphoma ([facility]) 04/27/2021   PAST MEDICAL HISTORY:  Active Ambulatory Problems    Diagnosis Date Noted   Primary CNS lymphoma ([facility]) 04/27/2021   Hyponatremia    Delirium due to another medical condition 05/01/2021   Essential hypertension 06/23/2021   Difficulty  walking 07/12/2021   Falls frequently 07/12/2021   Left sided numbness 07/12/2021   Memory loss or impairment 07/12/2021   Resolved Ambulatory Problems    Diagnosis Date Noted   No Resolved Ambulatory Problems   Past Medical History:  Diagnosis Date   Cancer ([facility])    Dementia ([facility])    SOCIAL HX:  Social History   Tobacco Use   Smoking status: Never   Smokeless tobacco: Never  Substance Use Topics   Alcohol use: Not Currently   FAMILY HX: No family history on file.    ALLERGIES: No Known Allergies   PERTINENT MEDICATIONS:  Outpatient Encounter Medications as of 07/31/2021  Medication Sig   acetaminophen (TYLENOL) 500 MG tablet Take 500 mg by mouth at bedtime.   amLODipine (NORVASC) 2.5 MG tablet Take 1 tablet (2.5 mg total) by mouth daily.   cyanocobalamin 1000 MCG tablet Take 1,000 mcg by mouth daily.   diphenhydrAMINE (BENADRYL) 50 MG capsule Take 1 capsule (50 mg total) by mouth at bedtime as needed for sleep.   pantoprazole (PROTONIX) 40 MG tablet Take 1 tablet (40 mg total) by mouth at bedtime.   sodium chloride 1 g tablet Take 1 tablet (1 g total) by mouth 2 (two) times daily with a meal.   No facility-administered encounter medications on file as of 07/31/2021.   Thank you for the opportunity to participate in the  care of Ms. Plotner.  The palliative care team will continue to follow. Please call our office at [phone] if we can be of additional assistance.   Jason Coop, NP ,   COVID-19 PATIENT SCREENING TOOL Asked and negative response unless otherwise noted:  Have you had symptoms of covid, tested positive or been in contact with someone with symptoms/positive test in the past 5-10 days?

## 2021-08-04 ENCOUNTER — Inpatient Hospital Stay (HOSPITAL_BASED_OUTPATIENT_CLINIC_OR_DEPARTMENT_OTHER): Payer: Medicare HMO | Admitting: Internal Medicine

## 2021-08-04 ENCOUNTER — Encounter: Payer: Self-pay | Admitting: Internal Medicine

## 2021-08-04 ENCOUNTER — Inpatient Hospital Stay: Payer: Medicare HMO | Attending: Internal Medicine

## 2021-08-04 ENCOUNTER — Inpatient Hospital Stay: Payer: Medicare HMO

## 2021-08-04 VITALS — BP 108/82 | HR 108 | Temp 96.9°F | Resp 16 | Wt 136.8 lb

## 2021-08-04 VITALS — BP 112/81 | HR 101 | Resp 16

## 2021-08-04 DIAGNOSIS — C8589 Other specified types of non-Hodgkin lymphoma, extranodal and solid organ sites: Secondary | ICD-10-CM

## 2021-08-04 DIAGNOSIS — Z5112 Encounter for antineoplastic immunotherapy: Secondary | ICD-10-CM | POA: Insufficient documentation

## 2021-08-04 DIAGNOSIS — C8519 Unspecified B-cell lymphoma, extranodal and solid organ sites: Secondary | ICD-10-CM | POA: Insufficient documentation

## 2021-08-04 LAB — CBC WITH DIFFERENTIAL/PLATELET
Abs Immature Granulocytes: 0.06 10*3/uL (ref 0.00–0.07)
Basophils Absolute: 0.1 10*3/uL (ref 0.0–0.1)
Basophils Relative: 1 %
Eosinophils Absolute: 0.1 10*3/uL (ref 0.0–0.5)
Eosinophils Relative: 1 %
HCT: 43.2 % (ref 36.0–46.0)
Hemoglobin: 14.3 g/dL (ref 12.0–15.0)
Immature Granulocytes: 1 %
Lymphocytes Relative: 38 %
Lymphs Abs: 2.5 10*3/uL (ref 0.7–4.0)
MCH: 29.1 pg (ref 26.0–34.0)
MCHC: 33.1 g/dL (ref 30.0–36.0)
MCV: 87.8 fL (ref 80.0–100.0)
Monocytes Absolute: 1 10*3/uL (ref 0.1–1.0)
Monocytes Relative: 15 %
Neutro Abs: 2.8 10*3/uL (ref 1.7–7.7)
Neutrophils Relative %: 44 %
Platelets: 307 10*3/uL (ref 150–400)
RBC: 4.92 MIL/uL (ref 3.87–5.11)
RDW: 13.5 % (ref 11.5–15.5)
WBC: 6.5 10*3/uL (ref 4.0–10.5)
nRBC: 0 % (ref 0.0–0.2)

## 2021-08-04 LAB — COMPREHENSIVE METABOLIC PANEL
ALT: 33 U/L (ref 0–44)
AST: 47 U/L — ABNORMAL HIGH (ref 15–41)
Albumin: 3.4 g/dL — ABNORMAL LOW (ref 3.5–5.0)
Alkaline Phosphatase: 45 U/L (ref 38–126)
Anion gap: 7 (ref 5–15)
BUN: <5 mg/dL — ABNORMAL LOW (ref 8–23)
CO2: 23 mmol/L (ref 22–32)
Calcium: 8.7 mg/dL — ABNORMAL LOW (ref 8.9–10.3)
Chloride: 99 mmol/L (ref 98–111)
Creatinine, Ser: 0.51 mg/dL (ref 0.44–1.00)
GFR, Estimated: >60 mL/min (ref >60)
Glucose, Bld: 123 mg/dL — ABNORMAL HIGH (ref 70–99)
Potassium: 4.2 mmol/L (ref 3.5–5.1)
Sodium: 129 mmol/L — ABNORMAL LOW (ref 135–145)
Total Bilirubin: 1 mg/dL (ref 0.3–1.2)
Total Protein: 6.4 g/dL — ABNORMAL LOW (ref 6.5–8.1)

## 2021-08-04 MED ORDER — SODIUM CHLORIDE 0.9% FLUSH
10.0000 mL | INTRAVENOUS | Status: DC | PRN
  Administered 2021-08-04: 10 mL via INTRAVENOUS
  Filled 2021-08-04: qty 10

## 2021-08-04 MED ORDER — SODIUM CHLORIDE 0.9 % IV SOLN
Freq: Once | INTRAVENOUS | Status: AC
  Filled 2021-08-04: qty 250

## 2021-08-04 MED ORDER — DIPHENHYDRAMINE HCL 25 MG PO CAPS
50.0000 mg | ORAL_CAPSULE | Freq: Once | ORAL | Status: AC
  Administered 2021-08-04: 50 mg via ORAL
  Filled 2021-08-04: qty 2

## 2021-08-04 MED ORDER — SODIUM CHLORIDE 0.9 % IV SOLN: 375.0000 mg/m2 | Freq: Once | INTRAVENOUS | Status: DC

## 2021-08-04 MED ORDER — HEPARIN SOD (PORK) LOCK FLUSH 100 UNIT/ML IV SOLN
INTRAVENOUS | Status: AC
  Administered 2021-08-04: 500 [IU]
  Filled 2021-08-04: qty 5

## 2021-08-04 MED ORDER — SODIUM CHLORIDE 0.9% FLUSH
10.0000 mL | INTRAVENOUS | Status: DC | PRN
Start: 2021-08-04 — End: 2021-08-04
  Administered 2021-08-04: 10 mL
  Filled 2021-08-04: qty 10

## 2021-08-04 MED ORDER — ACETAMINOPHEN 325 MG PO TABS
650.0000 mg | ORAL_TABLET | Freq: Once | ORAL | Status: AC
  Administered 2021-08-04: 650 mg via ORAL
  Filled 2021-08-04: qty 2

## 2021-08-04 MED ORDER — HEPARIN SOD (PORK) LOCK FLUSH 100 UNIT/ML IV SOLN
500.0000 [IU] | Freq: Once | INTRAVENOUS | Status: DC
  Filled 2021-08-04: qty 5

## 2021-08-04 MED ORDER — HEPARIN SOD (PORK) LOCK FLUSH 100 UNIT/ML IV SOLN
500.0000 [IU] | Freq: Once | INTRAVENOUS | Status: AC | PRN
Start: 2021-08-04 — End: 2021-08-04
  Filled 2021-08-04: qty 5

## 2021-08-04 MED ORDER — SODIUM CHLORIDE 0.9 % IV SOLN
375.0000 mg/m2 | Freq: Once | INTRAVENOUS | Status: AC
  Administered 2021-08-04: 600 mg via INTRAVENOUS
  Filled 2021-08-04: qty 50

## 2021-08-04 NOTE — Progress Notes (Signed)
[facility] [place], [place] [address] [phone]   Interval Evaluation  Date of Service: 08/04/21 Patient Name: Audrey Barron Patient MRN: [id number] Patient DOB: 11-24-1951 Provider: Ventura Sellers, MD  Identifying Statement:  Audrey Barron is a 70 y.o. female with  left hemispheric   Primary CNS Lymphoma    Oncologic History: Oncology History  Primary CNS lymphoma ([facility])  04/28/2021 Surgery   Open biopsy with Dr. 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      Biomarkers:  CD20 positive .  Bcl-6 positive  Ki-67 95%      Interval History:  Audrey Barron presents today for follow up and Rituximab infusion.  She denies new or progressive neurologic deficits today.  Doesn't describe changes today with weakness and language impairment.  She continues to walk on her own throughout the home.  For ADLs, she is independent with feeding and toileting but requires assistance for all other activities.  Denies seizures, headaches.    H+P (05/05/21) Patient presented to medical attention in April, with 2-3 months history of confusion, impaired speech, balance and gait impairment.  Neurologist ordered an MRI, which demonstrated infiltrative tumor within much of left hemisphere.  She underwent craniotomy and open biopsy on 04/28/21 with Dr. Izora [facility]as at [facility], which yielded a B-cell CNS Lymphoma.  Following surgery, she has continued to experience difficulty speaking, weakness on the right side, and poor balance.  She is unable to walk without assistance at home.  At present she is reliant on her  daughter for help with dressing, cooking, tolieting; a majority of activities of daily living.  She has been dosing decadron 82m three times per day; this has not clearly led to any improvements in functional status.   Medications: Current Outpatient Medications on File Prior to Visit  Medication Sig Dispense Refill   acetaminophen (TYLENOL) 500 MG tablet Take 500 mg by mouth at bedtime.     amLODipine (NORVASC) 2.5 MG tablet Take 1 tablet (2.5 mg total) by mouth daily. 30 tablet 3   cyanocobalamin 1000 MCG tablet Take 1,000 mcg by mouth daily.     diphenhydrAMINE (BENADRYL) 50 MG capsule Take 1 capsule (50 mg total) by mouth at bedtime as needed for sleep. 30 capsule 0   pantoprazole (PROTONIX) 40 MG tablet Take 1 tablet (40 mg total) by mouth at bedtime. 30 tablet 2   sodium chloride 1 g tablet Take 1 tablet (1 g total) by mouth 2 (two) times daily with a meal. 60 tablet 3   No current facility-administered medications on file prior to visit.    Allergies: No Known Allergies Past Medical History:  Past Medical History:  Diagnosis Date   Cancer ([facility]    lymphoma   Dementia ([facility]    Past Surgical History:  Past Surgical History:  Procedure Laterality Date   APPLICATION OF CRANIAL NAVIGATION N/A 04/28/2021   Procedure: APPLICATION OF CRANIAL NAVIGATION;  Surgeon: YMeade Maw MD;  Location: [facility] ORS;  Service: Neurosurgery;  Laterality: N/A;   CRANIOTOMY Left 04/28/2021   Procedure: CRANIOTOMY TUMOR EXCISION;  Surgeon: YMeade Maw MD;  Location: [facility] ORS;  Service: Neurosurgery;  Laterality: Left;   IR IMAGING GUIDED PORT INSERTION  05/12/2021   Social History:  Social History   Socioeconomic History   Marital status: Single    Spouse name: Not on file   Number of children: Not on file   Years of education: Not on file   Highest education level: Not on file  Occupational History   Not on file  Tobacco Use   Smoking status: Never   Smokeless tobacco: Never   Substance and Sexual Activity   Alcohol use: Not Currently   Drug use: Not Currently   Sexual activity: Not on file  Other Topics Concern   Not on file  Social History Narrative   Not on file   Social Determinants of Health   Financial Resource Strain: Not on file  Food Insecurity: Not on file  Transportation Needs: Not on file  Physical Activity: Not on file  Stress: Not on file  Social Connections: Not on file  Intimate Partner Violence: Not on file   Family History: History reviewed. No pertinent family history.  Review of Systems: Constitutional: Doesn't report fevers, chills or abnormal weight loss Eyes: Doesn't report blurriness of vision Ears, nose, mouth, throat, and face: Doesn't report sore throat Respiratory: Doesn't report cough, dyspnea or wheezes Cardiovascular: Doesn't report palpitation, chest discomfort  Gastrointestinal: +constipation GU: Doesn't report incontinence Skin: Doesn't report skin rashes Neurological: Per HPI Musculoskeletal: Doesn't report joint pain Behavioral/Psych: Doesn't report anxiety  Physical Exam: Vitals:   08/04/21 0835  BP: 108/82  Pulse: (!) 108  Resp: 16  Temp: (!) 96.9 F (36.1 C)   KPS: 60. General: Alert, cooperative, pleasant, in no acute distress Head: Normal EENT: No conjunctival injection or scleral icterus.  Lungs: Resp effort normal Cardiac: Regular rate Abdomen: Non-distended abdomen Skin: No rashes cyanosis or petechiae. Extremities: No clubbing or edema  Neurologic Exam: Mental Status: Awake, alert, attentive to examiner. Oriented to self and environment. Language is mildly impaired with regards to both fluency and comprehension.  Further mental status evaluation limited by dysphasia. Cranial Nerves: Visual acuity is grossly normal. Visual fields are full. Extra-ocular movements intact. No ptosis. Face is symmetric Motor: Tone and bulk are normal. Power is 4+/5 in right arm and leg. Reflexes are  symmetric, no pathologic reflexes present.  Sensory: Intact to light touch Gait: Dystaxic   Labs: I have reviewed the data as listed    Component Value Date/Time   NA 129 (L) 08/04/2021 0813   K 4.2 08/04/2021 0813   CL 99 08/04/2021 0813   CO2 23 08/04/2021 0813   GLUCOSE 123 (H) 08/04/2021 0813   BUN <5 (L) 08/04/2021 0813   CREATININE 0.51 08/04/2021 0813   CALCIUM 8.7 (L) 08/04/2021 0813   PROT 6.4 (L) 08/04/2021 0813   ALBUMIN 3.4 (L) 08/04/2021 0813   AST 47 (H) 08/04/2021 0813   ALT 33 08/04/2021 0813   ALKPHOS 45 08/04/2021 0813   BILITOT 1.0 08/04/2021 0813   GFRNONAA >60 08/04/2021 0813   Lab Results  Component Value Date   WBC 6.5 08/04/2021   NEUTROABS 2.8 08/04/2021   HGB 14.3 08/04/2021   HCT 43.2 08/04/2021   MCV 87.8 08/04/2021   PLT 307 08/04/2021     Assessment/Plan Primary CNS lymphoma ([facility]) [C85.89]  Cathren Sween is clinically stable today, now having completed two partial cycles of HD-MTX and rituximab.  No new or progressive neurologic deficits today.  She is cleared for Rituximab infusion today.    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  Salt tabs and fluid restriction have improved Na+ today up to 129 from 128 previously.  Once she is safe to receive high loads of hypotonic fluids, we can consider resumption of high dose MTX therapy in addition to rituximab.  We also discussed possibility of adding oral Temozolomide to her treatment plan, in absence of other cytotoxic chemotherapy.    We reviewed side effects of temodar, including fatigue, nausea/vomiting, constipation, and cytopenias.  They will think about the Temodar idea and reach out to [place] or discuss further at next treatment visit.  We ask that Audrey Barron return to clinic in 2 weeks for next rituximab infusion, or sooner if needed.  All questions were answered. The patient knows to call the clinic  with any problems, questions or concerns. No barriers to learning were detected.  The total time spent in the encounter was 30 minutes and more than 50% was on counseling and review of test results   Ventura Sellers, MD Medical Director of Neuro-Oncology [facility] [place] 08/04/21 9:21 AM

## 2021-08-04 NOTE — Progress Notes (Signed)
Patient denies new problems/concerns today.   °

## 2021-08-04 NOTE — Patient Instructions (Signed)
[facility]  Discharge Instructions: Thank you for choosing [facility] to provide your oncology and hematology care.  If you have a lab appointment with the [facility], please go directly to the [facility] and check in at the registration area.  Wear comfortable clothing and clothing appropriate for easy access to any Portacath or PICC line.   We strive to give you quality time with your provider. You may need to reschedule your appointment if you arrive late (15 or more minutes).  Arriving late affects you and other patients whose appointments are after yours.  Also, if you miss three or more appointments without notifying the office, you may be dismissed from the clinic at the provider's discretion.      For prescription refill requests, have your pharmacy contact our office and allow 72 hours for refills to be completed.    Today you received the following chemotherapy and/or immunotherapy agents - rituximab       To help prevent nausea and vomiting after your treatment, we encourage you to take your nausea medication as directed.  BELOW ARE SYMPTOMS THAT SHOULD BE REPORTED IMMEDIATELY: *FEVER GREATER THAN 100.4 F (38 C) OR HIGHER *CHILLS OR SWEATING *NAUSEA AND VOMITING THAT IS NOT CONTROLLED WITH YOUR NAUSEA MEDICATION *UNUSUAL SHORTNESS OF BREATH *UNUSUAL BRUISING OR BLEEDING *URINARY PROBLEMS (pain or burning when urinating, or frequent urination) *BOWEL PROBLEMS (unusual diarrhea, constipation, pain near the anus) TENDERNESS IN MOUTH AND THROAT WITH OR WITHOUT PRESENCE OF ULCERS (sore throat, sores in mouth, or a toothache) UNUSUAL RASH, SWELLING OR PAIN  UNUSUAL VAGINAL DISCHARGE OR ITCHING   Items with * indicate a potential emergency and should be followed up as soon as possible or go to the Emergency Department if any problems should occur.  Please show the CHEMOTHERAPY ALERT CARD or IMMUNOTHERAPY ALERT CARD at  check-in to the Emergency Department and triage nurse.  Should you have questions after your visit or need to cancel or reschedule your appointment, please contact [facility]  [phone] and follow the prompts.  Office hours are 8:00 a.m. to 4:30 p.m. Monday - Friday. Please note that voicemails left after 4:00 p.m. may not be returned until the following business day.  We are closed weekends and major holidays. You have access to a nurse at all times for urgent questions. Please call the main number to the clinic [phone] and follow the prompts.  For any non-urgent questions, you may also contact your provider using MyChart. We now offer e-Visits for anyone 55 and older to request care online for non-urgent symptoms. For details visit [email or web address].   Also download the MyChart app! Go to the app store, search "MyChart", open the app, select [facility], and log in with your MyChart username and password.  Due to Covid, a mask is required upon entering the hospital/clinic. If you do not have a mask, one will be given to you upon arrival. For doctor visits, patients may have 1 support person aged 54 or older with them. For treatment visits, patients cannot have anyone with them due to current Covid guidelines and our immunocompromised population.   Rituximab Injection What is this medication? RITUXIMAB (ri TUX i mab) is a monoclonal antibody. It is used to treat certain types of cancer like non-Hodgkin lymphoma and chronic lymphocytic leukemia. It is also used to treat rheumatoid arthritis, granulomatosis with polyangiitis,microscopic polyangiitis, and pemphigus vulgaris. This medicine may be used for other  purposes; ask your health care provider orpharmacist if you have questions. COMMON BRAND NAME(S): RIABNI, Rituxan, RUXIENCE What should I tell my care team before I take this medication? They need to know if you have any of these  conditions: chest pain heart disease infection especially a viral infection such as chickenpox, cold sores, hepatitis B, or herpes immune system problems irregular heartbeat or rhythm kidney disease low blood counts (white cells, platelets, or red cells) lung disease recent or upcoming vaccine an unusual or allergic reaction to rituximab, other medicines, foods, dyes, or preservatives pregnant or trying to get pregnant breast-feeding How should I use this medication? This medicine is injected into a vein. It is given by a health care provider ina hospital or clinic setting. A special MedGuide will be given to you before each treatment. Be sure to readthis information carefully each time. Talk to your health care provider about the use of this medicine in children. While this drug may be prescribed for children as young as 6 months forselected conditions, precautions do apply. Overdosage: If you think you have taken too much of this medicine contact apoison control center or emergency room at once. NOTE: This medicine is only for you. Do not share this medicine with others. What if I miss a dose? Keep appointments for follow-up doses. It is important not to miss your dose.Call your health care provider if you are unable to keep an appointment. What may interact with this medication? Do not take this medicine with any of the following medicines: live vaccines This medicine may also interact with the following medicines: cisplatin This list may not describe all possible interactions. Give your health care provider a list of all the medicines, herbs, non-prescription drugs, or dietary supplements you use. Also tell them if you smoke, drink alcohol, or use illegaldrugs. Some items may interact with your medicine. What should I watch for while using this medication? Your condition will be monitored carefully while you are receiving thismedicine. You may need blood work done while you are taking  this medicine. This medicine can cause serious infusion reactions. To reduce the risk your health care provider may give you other medicines to take before receiving thisone. Be sure to follow the directions from your health care provider. This medicine may increase your risk of getting an infection. Call your health care provider for advice if you get a fever, chills, sore throat, or other symptoms of a cold or flu. Do not treat yourself. Try to avoid being aroundpeople who are sick. Call your health care provider if you are around anyone with measles,chickenpox, or if you develop sores or blisters that do not heal properly. Avoid taking medicines that contain aspirin, acetaminophen, ibuprofen, naproxen, or ketoprofen unless instructed by your health care provider. Thesemedicines may hide a fever. This medicine may cause serious skin reactions. They can happen weeks to months after starting the medicine. Contact your health care provider right away if you notice fevers or flu-like symptoms with a rash. The rash may be red or purple and then turn into blisters or peeling of the skin. Or, you might notice a red rash with swelling of the face, lips or lymph nodes in your neck or underyour arms. In some patients, this medicine may cause a serious brain infection that may cause death. If you have any problems seeing, thinking, speaking, walking, or standing, tell your healthcare professional right away. If you cannot reachyour healthcare professional, urgently seek other source of medical care. Do  not become pregnant while taking this medicine or for at least 12 months after stopping it. Women should inform their health care provider if they wish to become pregnant or think they might be pregnant. There is potential for serious harm to an unborn child. Talk to your health care provider for more information. Women should use a reliable form of birth control while taking this medicine and for 12 months after  stopping it. Do not breast-feed whiletaking this medicine or for at least 6 months after stopping it. What side effects may I notice from receiving this medication? Side effects that you should report to your health care provider as soon aspossible: allergic reactions (skin rash, itching or hives; swelling of the face, lips, or tongue) diarrhea edema (sudden weight gain; swelling of the ankles, feet, hands or other unusual swelling; trouble breathing) fast, irregular heartbeat heart attack (trouble breathing; pain or tightness in the chest, neck, back or arms; unusually weak or tired) infection (fever, chills, cough, sore throat, pain or trouble passing urine) kidney injury (trouble passing urine or change in the amount of urine) liver injury (dark yellow or brown urine; general ill feeling or flu-like symptoms; loss of appetite, right upper belly pain; unusually weak or tired, yellowing of the eyes or skin) low blood pressure (dizziness; feeling faint or lightheaded, falls; unusually weak or tired) low red blood cell counts (trouble breathing; feeling faint; lightheaded, falls; unusually weak or tired) mouth sores redness, blistering, peeling, or loosening of the skin, including inside the mouth stomach pain unusual bruising or bleeding wheezing (trouble breathing with loud or whistling sounds) vomiting Side effects that usually do not require medical attention (report to yourhealth care provider if they continue or are bothersome): headache joint pain muscle cramps, pain nausea This list may not describe all possible side effects. Call your doctor for medical advice about side effects. You may report side effects to FDA at1-800-FDA-1088. Where should I keep my medication? This medicine is given in a hospital or clinic. It will not be stored at home. NOTE: This sheet is a summary. It may not cover all possible information. If you have questions about this medicine, talk to your doctor,  pharmacist, orhealth care provider.  2022 Elsevier/Gold Standard (2020-12-01 15:47:26)

## 2021-08-07 ENCOUNTER — Other Ambulatory Visit: Payer: Self-pay | Admitting: Internal Medicine

## 2021-08-07 DIAGNOSIS — C8589 Other specified types of non-Hodgkin lymphoma, extranodal and solid organ sites: Secondary | ICD-10-CM

## 2021-08-15 DIAGNOSIS — E871 Hypo-osmolality and hyponatremia: Secondary | ICD-10-CM | POA: Insufficient documentation

## 2021-08-17 ENCOUNTER — Telehealth: Payer: Self-pay | Admitting: *Deleted

## 2021-08-17 ENCOUNTER — Encounter: Payer: Self-pay | Admitting: Nephrology

## 2021-08-17 NOTE — Telephone Encounter (Signed)
Patient's daughter called to report that she was cleared by Nephrology for chemotherapy.

## 2021-08-18 ENCOUNTER — Encounter: Payer: Self-pay | Admitting: Internal Medicine

## 2021-08-18 ENCOUNTER — Other Ambulatory Visit: Payer: Self-pay

## 2021-08-18 ENCOUNTER — Inpatient Hospital Stay (HOSPITAL_BASED_OUTPATIENT_CLINIC_OR_DEPARTMENT_OTHER): Payer: Medicare HMO | Admitting: Internal Medicine

## 2021-08-18 ENCOUNTER — Telehealth: Payer: Self-pay | Admitting: Pharmacist

## 2021-08-18 ENCOUNTER — Inpatient Hospital Stay: Payer: Medicare HMO

## 2021-08-18 ENCOUNTER — Telehealth: Payer: Self-pay | Admitting: Pharmacy Technician

## 2021-08-18 ENCOUNTER — Other Ambulatory Visit (HOSPITAL_COMMUNITY): Payer: Self-pay

## 2021-08-18 VITALS — BP 127/101 | HR 100 | Temp 96.6°F | Resp 16 | Wt 139.8 lb

## 2021-08-18 VITALS — BP 116/85 | HR 98

## 2021-08-18 DIAGNOSIS — C8589 Other specified types of non-Hodgkin lymphoma, extranodal and solid organ sites: Secondary | ICD-10-CM

## 2021-08-18 DIAGNOSIS — Z5112 Encounter for antineoplastic immunotherapy: Secondary | ICD-10-CM | POA: Diagnosis not present

## 2021-08-18 LAB — CBC WITH DIFFERENTIAL/PLATELET
Abs Immature Granulocytes: 0.03 10*3/uL (ref 0.00–0.07)
Basophils Absolute: 0 10*3/uL (ref 0.0–0.1)
Basophils Relative: 1 %
Eosinophils Absolute: 0.1 10*3/uL (ref 0.0–0.5)
Eosinophils Relative: 2 %
HCT: 40.9 % (ref 36.0–46.0)
Hemoglobin: 13.6 g/dL (ref 12.0–15.0)
Immature Granulocytes: 1 %
Lymphocytes Relative: 35 %
Lymphs Abs: 2.1 10*3/uL (ref 0.7–4.0)
MCH: 29.4 pg (ref 26.0–34.0)
MCHC: 33.3 g/dL (ref 30.0–36.0)
MCV: 88.3 fL (ref 80.0–100.0)
Monocytes Absolute: 1 10*3/uL (ref 0.1–1.0)
Monocytes Relative: 17 %
Neutro Abs: 2.9 10*3/uL (ref 1.7–7.7)
Neutrophils Relative %: 44 %
Platelets: 246 10*3/uL (ref 150–400)
RBC: 4.63 MIL/uL (ref 3.87–5.11)
RDW: 13 % (ref 11.5–15.5)
WBC: 6.2 10*3/uL (ref 4.0–10.5)
nRBC: 0 % (ref 0.0–0.2)

## 2021-08-18 LAB — COMPREHENSIVE METABOLIC PANEL
ALT: 50 U/L — ABNORMAL HIGH (ref 0–44)
AST: 66 U/L — ABNORMAL HIGH (ref 15–41)
Albumin: 3.3 g/dL — ABNORMAL LOW (ref 3.5–5.0)
Alkaline Phosphatase: 44 U/L (ref 38–126)
Anion gap: 9 (ref 5–15)
BUN: 7 mg/dL — ABNORMAL LOW (ref 8–23)
CO2: 22 mmol/L (ref 22–32)
Calcium: 8.7 mg/dL — ABNORMAL LOW (ref 8.9–10.3)
Chloride: 104 mmol/L (ref 98–111)
Creatinine, Ser: 0.42 mg/dL — ABNORMAL LOW (ref 0.44–1.00)
GFR, Estimated: >60 mL/min (ref >60)
Glucose, Bld: 92 mg/dL (ref 70–99)
Potassium: 3.9 mmol/L (ref 3.5–5.1)
Sodium: 135 mmol/L (ref 135–145)
Total Bilirubin: 0.7 mg/dL (ref 0.3–1.2)
Total Protein: 6 g/dL — ABNORMAL LOW (ref 6.5–8.1)

## 2021-08-18 MED ORDER — DIPHENHYDRAMINE HCL 25 MG PO CAPS
50.0000 mg | ORAL_CAPSULE | Freq: Once | ORAL | Status: AC
  Administered 2021-08-18: 50 mg via ORAL
  Filled 2021-08-18: qty 2

## 2021-08-18 MED ORDER — ONDANSETRON HCL 8 MG PO TABS
8.0000 mg | ORAL_TABLET | Freq: Two times a day (BID) | ORAL | 1 refills | Status: DC | PRN
  Filled 2021-08-18 – 2021-08-21 (×2): qty 30, 15d supply, fill #0

## 2021-08-18 MED ORDER — SODIUM CHLORIDE 0.9 % IV SOLN
Freq: Once | INTRAVENOUS | Status: AC
  Filled 2021-08-18: qty 250

## 2021-08-18 MED ORDER — ACETAMINOPHEN 325 MG PO TABS
650.0000 mg | ORAL_TABLET | Freq: Once | ORAL | Status: AC
  Administered 2021-08-18: 650 mg via ORAL
  Filled 2021-08-18: qty 2

## 2021-08-18 MED ORDER — HEPARIN SOD (PORK) LOCK FLUSH 100 UNIT/ML IV SOLN
INTRAVENOUS | Status: AC
  Administered 2021-08-18: 500 [IU]
  Filled 2021-08-18: qty 5

## 2021-08-18 MED ORDER — SODIUM CHLORIDE 0.9% FLUSH
10.0000 mL | INTRAVENOUS | Status: DC | PRN
  Administered 2021-08-18: 10 mL
  Filled 2021-08-18: qty 10

## 2021-08-18 MED ORDER — TEMOZOLOMIDE 100 MG PO CAPS
100.0000 mg | ORAL_CAPSULE | Freq: Every day | ORAL | 0 refills | Status: DC
  Filled 2021-08-18 – 2021-08-21 (×2): qty 5, 5d supply, fill #0

## 2021-08-18 MED ORDER — TEMOZOLOMIDE 140 MG PO CAPS
140.0000 mg | ORAL_CAPSULE | Freq: Every day | ORAL | 0 refills | Status: DC
Start: 2021-08-18 — End: 2021-09-13
  Filled 2021-08-18 – 2021-08-21 (×2): qty 5, 5d supply, fill #0

## 2021-08-18 MED ORDER — HEPARIN SOD (PORK) LOCK FLUSH 100 UNIT/ML IV SOLN
500.0000 [IU] | Freq: Once | INTRAVENOUS | Status: AC | PRN
  Filled 2021-08-18: qty 5

## 2021-08-18 MED ORDER — SODIUM CHLORIDE 0.9 % IV SOLN
375.0000 mg/m2 | Freq: Once | INTRAVENOUS | Status: AC
  Administered 2021-08-18: 600 mg via INTRAVENOUS
  Filled 2021-08-18: qty 50

## 2021-08-18 NOTE — Telephone Encounter (Signed)
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 Patient was not able to tolerate high dose methotrexate.   CBC from 08/18/21 assessed, no relevant lab abnormalities. Prescription dose and frequency assessed.   Current medication list in Epic reviewed, no DDIs with temozolomide identified.  Evaluated chart and no patient barriers to medication adherence identified.   Prescription has been e-scribed to the [facility] for benefits analysis and approval.  Oral Oncology Clinic will continue to follow for insurance authorization, copayment issues, initial counseling and start date.   Darl Pikes, PharmD, BCPS, BCOP, CPP Hematology/Oncology Clinical Pharmacist Practitioner [facility] [phone]  08/18/2021 9:51 AM

## 2021-08-18 NOTE — Patient Instructions (Signed)
[facility]  Discharge Instructions: Thank you for choosing [facility] to provide your oncology and hematology care.  If you have a lab appointment with the [facility], please go directly to the [facility] and check in at the registration area.  Wear comfortable clothing and clothing appropriate for easy access to any Portacath or PICC line.   We strive to give you quality time with your provider. You may need to reschedule your appointment if you arrive late (15 or more minutes).  Arriving late affects you and other patients whose appointments are after yours.  Also, if you miss three or more appointments without notifying the office, you may be dismissed from the clinic at the provider's discretion.      For prescription refill requests, have your pharmacy contact our office and allow 72 hours for refills to be completed.    Today you received the following chemotherapy and/or immunotherapy agents - rituximab      To help prevent nausea and vomiting after your treatment, we encourage you to take your nausea medication as directed.  BELOW ARE SYMPTOMS THAT SHOULD BE REPORTED IMMEDIATELY: *FEVER GREATER THAN 100.4 F (38 C) OR HIGHER *CHILLS OR SWEATING *NAUSEA AND VOMITING THAT IS NOT CONTROLLED WITH YOUR NAUSEA MEDICATION *UNUSUAL SHORTNESS OF BREATH *UNUSUAL BRUISING OR BLEEDING *URINARY PROBLEMS (pain or burning when urinating, or frequent urination) *BOWEL PROBLEMS (unusual diarrhea, constipation, pain near the anus) TENDERNESS IN MOUTH AND THROAT WITH OR WITHOUT PRESENCE OF ULCERS (sore throat, sores in mouth, or a toothache) UNUSUAL RASH, SWELLING OR PAIN  UNUSUAL VAGINAL DISCHARGE OR ITCHING   Items with * indicate a potential emergency and should be followed up as soon as possible or go to the Emergency Department if any problems should occur.  Please show the CHEMOTHERAPY ALERT CARD or IMMUNOTHERAPY ALERT CARD at check-in  to the Emergency Department and triage nurse.  Should you have questions after your visit or need to cancel or reschedule your appointment, please contact [facility]  [phone] and follow the prompts.  Office hours are 8:00 a.m. to 4:30 p.m. Monday - Friday. Please note that voicemails left after 4:00 p.m. may not be returned until the following business day.  We are closed weekends and major holidays. You have access to a nurse at all times for urgent questions. Please call the main number to the clinic [phone] and follow the prompts.  For any non-urgent questions, you may also contact your provider using MyChart. We now offer e-Visits for anyone 72 and older to request care online for non-urgent symptoms. For details visit [email or web address].   Also download the MyChart app! Go to the app store, search "MyChart", open the app, select [facility], and log in with your MyChart username and password.  Due to Covid, a mask is required upon entering the hospital/clinic. If you do not have a mask, one will be given to you upon arrival. For doctor visits, patients may have 1 support person aged 78 or older with them. For treatment visits, patients cannot have anyone with them due to current Covid guidelines and our immunocompromised population.   Rituximab Injection What is this medication? RITUXIMAB (ri TUX i mab) is a monoclonal antibody. It is used to treat certain types of cancer like non-Hodgkin lymphoma and chronic lymphocytic leukemia. It is also used to treat rheumatoid arthritis, granulomatosis with polyangiitis,microscopic polyangiitis, and pemphigus vulgaris. This medicine may be used for other purposes;  ask your health care provider orpharmacist if you have questions. COMMON BRAND NAME(S): RIABNI, Rituxan, RUXIENCE What should I tell my care team before I take this medication? They need to know if you have any of these conditions: chest  pain heart disease infection especially a viral infection such as chickenpox, cold sores, hepatitis B, or herpes immune system problems irregular heartbeat or rhythm kidney disease low blood counts (white cells, platelets, or red cells) lung disease recent or upcoming vaccine an unusual or allergic reaction to rituximab, other medicines, foods, dyes, or preservatives pregnant or trying to get pregnant breast-feeding How should I use this medication? This medicine is injected into a vein. It is given by a health care provider ina hospital or clinic setting. A special MedGuide will be given to you before each treatment. Be sure to readthis information carefully each time. Talk to your health care provider about the use of this medicine in children. While this drug may be prescribed for children as young as 6 months forselected conditions, precautions do apply. Overdosage: If you think you have taken too much of this medicine contact apoison control center or emergency room at once. NOTE: This medicine is only for you. Do not share this medicine with others. What if I miss a dose? Keep appointments for follow-up doses. It is important not to miss your dose.Call your health care provider if you are unable to keep an appointment. What may interact with this medication? Do not take this medicine with any of the following medicines: live vaccines This medicine may also interact with the following medicines: cisplatin This list may not describe all possible interactions. Give your health care provider a list of all the medicines, herbs, non-prescription drugs, or dietary supplements you use. Also tell them if you smoke, drink alcohol, or use illegaldrugs. Some items may interact with your medicine. What should I watch for while using this medication? Your condition will be monitored carefully while you are receiving thismedicine. You may need blood work done while you are taking this  medicine. This medicine can cause serious infusion reactions. To reduce the risk your health care provider may give you other medicines to take before receiving thisone. Be sure to follow the directions from your health care provider. This medicine may increase your risk of getting an infection. Call your health care provider for advice if you get a fever, chills, sore throat, or other symptoms of a cold or flu. Do not treat yourself. Try to avoid being aroundpeople who are sick. Call your health care provider if you are around anyone with measles,chickenpox, or if you develop sores or blisters that do not heal properly. Avoid taking medicines that contain aspirin, acetaminophen, ibuprofen, naproxen, or ketoprofen unless instructed by your health care provider. Thesemedicines may hide a fever. This medicine may cause serious skin reactions. They can happen weeks to months after starting the medicine. Contact your health care provider right away if you notice fevers or flu-like symptoms with a rash. The rash may be red or purple and then turn into blisters or peeling of the skin. Or, you might notice a red rash with swelling of the face, lips or lymph nodes in your neck or underyour arms. In some patients, this medicine may cause a serious brain infection that may cause death. If you have any problems seeing, thinking, speaking, walking, or standing, tell your healthcare professional right away. If you cannot reachyour healthcare professional, urgently seek other source of medical care. Do not  become pregnant while taking this medicine or for at least 12 months after stopping it. Women should inform their health care provider if they wish to become pregnant or think they might be pregnant. There is potential for serious harm to an unborn child. Talk to your health care provider for more information. Women should use a reliable form of birth control while taking this medicine and for 12 months after stopping  it. Do not breast-feed whiletaking this medicine or for at least 6 months after stopping it. What side effects may I notice from receiving this medication? Side effects that you should report to your health care provider as soon aspossible: allergic reactions (skin rash, itching or hives; swelling of the face, lips, or tongue) diarrhea edema (sudden weight gain; swelling of the ankles, feet, hands or other unusual swelling; trouble breathing) fast, irregular heartbeat heart attack (trouble breathing; pain or tightness in the chest, neck, back or arms; unusually weak or tired) infection (fever, chills, cough, sore throat, pain or trouble passing urine) kidney injury (trouble passing urine or change in the amount of urine) liver injury (dark yellow or brown urine; general ill feeling or flu-like symptoms; loss of appetite, right upper belly pain; unusually weak or tired, yellowing of the eyes or skin) low blood pressure (dizziness; feeling faint or lightheaded, falls; unusually weak or tired) low red blood cell counts (trouble breathing; feeling faint; lightheaded, falls; unusually weak or tired) mouth sores redness, blistering, peeling, or loosening of the skin, including inside the mouth stomach pain unusual bruising or bleeding wheezing (trouble breathing with loud or whistling sounds) vomiting Side effects that usually do not require medical attention (report to yourhealth care provider if they continue or are bothersome): headache joint pain muscle cramps, pain nausea This list may not describe all possible side effects. Call your doctor for medical advice about side effects. You may report side effects to FDA at1-800-FDA-1088. Where should I keep my medication? This medicine is given in a hospital or clinic. It will not be stored at home. NOTE: This sheet is a summary. It may not cover all possible information. If you have questions about this medicine, talk to your doctor, pharmacist,  orhealth care provider.  2022 Elsevier/Gold Standard (2020-12-01 15:47:26)

## 2021-08-18 NOTE — Telephone Encounter (Addendum)
Oral Oncology Patient Advocate Encounter  Medicare B vs D authorization for Temodar is approved under Part B benefit through [employer].  Patient's copay is $193.66 with [facility], $0 after Medicaid is billed.  [facility] Patient [facility] Phone [phone] Fax [phone] 08/21/2021 10:30 AM

## 2021-08-18 NOTE — Progress Notes (Signed)
[facility] at [place], [place] [address] [phone]   Interval Evaluation  Date of Service: 08/18/21 Patient Name: Audrey Barron Patient MRN: [id number] Patient DOB: 07/21/1951 Provider: Ventura Sellers, MD  Identifying Statement:  Audrey Barron is a 70 y.o. female with  left hemispheric   Primary CNS Lymphoma    Oncologic History: Oncology History  Primary CNS lymphoma ([place])  04/28/2021 Surgery   Open biopsy with Dr. 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  Patient will transition to q2 week Rituximab in interim      Biomarkers:  CD20 positive .  Bcl-6 positive  Ki-67 95%      Interval History:  Audrey Barron presents today for follow up and Rituximab infusion.  She denies new or progressive neurologic deficits today.  Continues on fluid restriction and salt tabs, visited with nephrology earlier this week.  She continues to walk on her own throughout the home.  For ADLs, she is independent with feeding and toileting but requires assistance for all other activities.  Denies seizures, headaches.    H+P (05/05/21) Patient presented to medical attention in April, with 2-3 months history of confusion, impaired speech, balance and gait impairment.  Neurologist ordered an MRI, which demonstrated infiltrative tumor within much of left hemisphere.  She underwent craniotomy and open biopsy on 04/28/21 with Dr. Izora Ribas at [facility], which yielded a B-cell CNS Lymphoma.  Following surgery, she has continued to experience difficulty speaking, weakness on the right side, and poor balance.  She is unable  to walk without assistance at home.  At present she is reliant on her daughter for help with dressing, cooking, tolieting; a majority of activities of daily living.  She has been dosing decadron 43m three times per day; this has not clearly led to any improvements in functional status.   Medications: Current Outpatient Medications on File Prior to Visit  Medication Sig Dispense Refill   pantoprazole (PROTONIX) 40 MG tablet Take by mouth.     acetaminophen (TYLENOL) 500 MG tablet Take 500 mg by mouth at bedtime.     amLODipine (NORVASC) 2.5 MG tablet Take 1 tablet (2.5 mg total) by mouth daily. 30 tablet 3   cyanocobalamin 1000 MCG tablet Take 1,000 mcg by mouth daily.     diphenhydrAMINE (BENADRYL) 50 MG capsule Take 1 capsule (50 mg total) by mouth at bedtime as needed for sleep. 30 capsule 0   pantoprazole (PROTONIX) 40 MG tablet TAKE ONE TABLET BY MOUTH AT BEDTIME. 30 tablet 0   sodium chloride 1 g tablet Take 1 tablet (1 g total) by mouth 2 (two) times daily with a meal. 60 tablet 3   No current facility-administered medications on file prior to visit.    Allergies: No Known Allergies Past Medical History:  Past Medical History:  Diagnosis Date   [place] ([place]    lymphoma   Dementia ([place]    Past Surgical History:  Past Surgical History:  Procedure Laterality Date   APPLICATION OF CRANIAL NAVIGATION N/A 04/28/2021   Procedure: APPLICATION OF CRANIAL NAVIGATION;  Surgeon: YMeade Maw MD;  Location: [facility] ORS;  Service: Neurosurgery;  Laterality: N/A;  CRANIOTOMY Left 04/28/2021   Procedure: CRANIOTOMY TUMOR EXCISION;  Surgeon: Meade Maw, MD;  Location: [facility] ORS;  Service: Neurosurgery;  Laterality: Left;   IR IMAGING GUIDED PORT INSERTION  05/12/2021   Social History:  Social History   Socioeconomic History   Marital status: Single    Spouse name: Not on file   Number of children: Not on file   Years of education: Not on file   Highest education level: Not on  file  Occupational History   Not on file  Tobacco Use   Smoking status: Never   Smokeless tobacco: Never  Substance and Sexual Activity   Alcohol use: Not Currently   Drug use: Not Currently   Sexual activity: Not on file  Other Topics Concern   Not on file  Social History Narrative   Not on file   Social Determinants of Health   Financial Resource Strain: Not on file  Food Insecurity: Not on file  Transportation Needs: Not on file  Physical Activity: Not on file  Stress: Not on file  Social Connections: Not on file  Intimate Partner Violence: Not on file   Family History: No family history on file.  Review of Systems: Constitutional: Doesn't report fevers, chills or abnormal weight loss Eyes: Doesn't report blurriness of vision Ears, nose, mouth, throat, and face: Doesn't report sore throat Respiratory: Doesn't report cough, dyspnea or wheezes Cardiovascular: Doesn't report palpitation, chest discomfort  Gastrointestinal: +constipation GU: Doesn't report incontinence Skin: Doesn't report skin rashes Neurological: Per HPI Musculoskeletal: Doesn't report joint pain Behavioral/Psych: Doesn't report anxiety  Physical Exam: Vitals:   08/18/21 0856  BP: (!) 127/101  Pulse: 100  Resp: 16  Temp: (!) 96.6 F (35.9 C)  SpO2: 96%   KPS: 60. General: Alert, cooperative, pleasant, in no acute distress Head: Normal EENT: No conjunctival injection or scleral icterus.  Lungs: Resp effort normal Cardiac: Regular rate Abdomen: Non-distended abdomen Skin: No rashes cyanosis or petechiae. Extremities: No clubbing or edema  Neurologic Exam: Mental Status: Awake, alert, attentive to examiner. Oriented to self and environment. Language is mildly impaired with regards to both fluency and comprehension.  Further mental status evaluation limited by dysphasia. Cranial Nerves: Visual acuity is grossly normal. Visual fields are full. Extra-ocular movements intact. No ptosis. Face is  symmetric Motor: Tone and bulk are normal. Power is 4+/5 in right arm and leg. Reflexes are symmetric, no pathologic reflexes present.  Sensory: Intact to light touch Gait: Dystaxic   Labs: I have reviewed the data as listed    Component Value Date/Time   NA 129 (L) 08/04/2021 0813   K 4.2 08/04/2021 0813   CL 99 08/04/2021 0813   CO2 23 08/04/2021 0813   GLUCOSE 123 (H) 08/04/2021 0813   BUN <5 (L) 08/04/2021 0813   CREATININE 0.51 08/04/2021 0813   CALCIUM 8.7 (L) 08/04/2021 0813   PROT 6.4 (L) 08/04/2021 0813   ALBUMIN 3.4 (L) 08/04/2021 0813   AST 47 (H) 08/04/2021 0813   ALT 33 08/04/2021 0813   ALKPHOS 45 08/04/2021 0813   BILITOT 1.0 08/04/2021 0813   GFRNONAA >60 08/04/2021 0813   Lab Results  Component Value Date   WBC 6.5 08/04/2021   NEUTROABS 2.8 08/04/2021   HGB 14.3 08/04/2021   HCT 43.2 08/04/2021   MCV 87.8 08/04/2021   PLT 307 08/04/2021     Assessment/Plan Primary CNS lymphoma ([place]) [C85.89]  Darren Nodal is clinically stable today, now having completed two partial cycles of HD-MTX  and rituximab.  No new or progressive neurologic deficits today.  She is cleared for Rituximab infusion today.    We additionally recommended initiating treatment with Temozolomide 156m/m2, on for five days and off for twenty three days in twenty eight day cycles. The patient will have a complete blood count performed on days 21 and 28 of each cycle, and a comprehensive metabolic panel performed on day 28 of each cycle. Labs may need to be performed more often. Zofran will prescribed for home use for nausea/vomiting.   Methotrexate will continue to be withheld because of fluid restriction, SIADH, significant volume of hypotonic fluids required with safe MTX administration.  We reviewed side effects of temodar, including fatigue, nausea/vomiting, constipation, and cytopenias.  Informed consent was obtained verbally at bedside to proceed with oral  chemotherapy.  Chemotherapy should be held for the following:  ANC less than 1,000  Platelets less than 100,000  LFT or creatinine greater than 2x ULN  If clinical concerns/contraindications develop  We ask that BJunita Pushreturn to clinic in 2 weeks for next rituximab infusion, or sooner if needed.  We will recommend repeat brain MRI after cycle #2 of TMZ.  All questions were answered. The patient knows to call the clinic with any problems, questions or concerns. No barriers to learning were detected.  The total time spent in the encounter was 40 minutes and more than 50% was on counseling and review of test results   ZVentura Sellers MD Medical Director of Neuro-Oncology [facility]08/26/22 8:59 AM

## 2021-08-21 ENCOUNTER — Other Ambulatory Visit (HOSPITAL_COMMUNITY): Payer: Self-pay

## 2021-08-21 NOTE — Telephone Encounter (Signed)
Oral Chemotherapy Pharmacist Encounter  [facility] will deliver medication to Ms. Yaney tomorrow. She will take her first dose tomorrow 08/22/21 at bedtime.  Patient Education I spoke with patient's daughter Anderson Malta for overview of new oral chemotherapy medication: Temodar (temozolomide) for the treatment of primary CNS lymphoma in conjunction with rituxamab, planned duration until disease progression or unacceptable drug toxicity. Patient was not able to tolerate high dose methotrexate.   Counseled Jennifer on administration, dosing, side effects, monitoring, drug-food interactions, safe handling, storage, and disposal. Patient will take ONE '100mg'$  capsule and ONE '140mg'$  capsule by mouth daily. May take on an empty stomach to decrease nausea & vomiting. Take for 5 days, every 28 days.  Side effects include but not limited to: N/V, fatigue, constipation, decreased wbc.    Reviewed with Anderson Malta importance of keeping a medication schedule and plan for any missed doses.  After discussion with Anderson Malta, no patient barriers to medication adherence identified.   Anderson Malta voiced understanding and appreciation. All questions answered. Medication handout provided.  Provided [facility] with Oral Chemotherapy Navigation Clinic phone number. Anderson Malta knows to call the office with questions or concerns. Oral Chemotherapy Navigation Clinic will continue to follow.  Darl Pikes, PharmD, BCPS, BCOP, CPP Hematology/Oncology Clinical Pharmacist Practitioner [facility]/AP [facility] [phone]  08/21/2021 1:53 PM

## 2021-08-22 ENCOUNTER — Other Ambulatory Visit (HOSPITAL_COMMUNITY): Payer: Self-pay

## 2021-08-22 NOTE — Telephone Encounter (Signed)
Oral Oncology Patient Advocate Encounter  I spoke with [name]'s daughter, Audrey Barron, on 8/29 to set up delivery of Temodar.  Address verified for shipment.  Temodar will be filled through [facility] and mailed 8/29 for delivery 8/30.    [facility] will call 7-10 days before next refill is due to complete adherence call and set up delivery of medication.     [facility] Phone [phone] Fax [phone] 08/22/2021 8:56 AM

## 2021-08-29 ENCOUNTER — Other Ambulatory Visit (HOSPITAL_COMMUNITY): Payer: Self-pay

## 2021-09-01 ENCOUNTER — Inpatient Hospital Stay: Payer: Medicare HMO | Attending: Internal Medicine

## 2021-09-01 ENCOUNTER — Inpatient Hospital Stay: Payer: Medicare HMO

## 2021-09-01 ENCOUNTER — Encounter: Payer: Self-pay | Admitting: Internal Medicine

## 2021-09-01 ENCOUNTER — Inpatient Hospital Stay (HOSPITAL_BASED_OUTPATIENT_CLINIC_OR_DEPARTMENT_OTHER): Payer: Medicare HMO | Admitting: Internal Medicine

## 2021-09-01 VITALS — BP 142/94 | HR 105 | Temp 97.2°F | Resp 18 | Wt 139.7 lb

## 2021-09-01 VITALS — BP 118/91 | HR 99 | Temp 96.6°F | Wt 139.6 lb

## 2021-09-01 DIAGNOSIS — C8589 Other specified types of non-Hodgkin lymphoma, extranodal and solid organ sites: Secondary | ICD-10-CM | POA: Diagnosis not present

## 2021-09-01 DIAGNOSIS — C8519 Unspecified B-cell lymphoma, extranodal and solid organ sites: Secondary | ICD-10-CM | POA: Insufficient documentation

## 2021-09-01 DIAGNOSIS — Z5112 Encounter for antineoplastic immunotherapy: Secondary | ICD-10-CM | POA: Insufficient documentation

## 2021-09-01 DIAGNOSIS — E222 Syndrome of inappropriate secretion of antidiuretic hormone: Secondary | ICD-10-CM | POA: Diagnosis not present

## 2021-09-01 DIAGNOSIS — R112 Nausea with vomiting, unspecified: Secondary | ICD-10-CM | POA: Insufficient documentation

## 2021-09-01 LAB — CBC WITH DIFFERENTIAL/PLATELET
Abs Immature Granulocytes: 0.02 10*3/uL (ref 0.00–0.07)
Basophils Absolute: 0 10*3/uL (ref 0.0–0.1)
Basophils Relative: 1 %
Eosinophils Absolute: 0.2 10*3/uL (ref 0.0–0.5)
Eosinophils Relative: 3 %
HCT: 41.6 % (ref 36.0–46.0)
Hemoglobin: 14 g/dL (ref 12.0–15.0)
Immature Granulocytes: 0 %
Lymphocytes Relative: 37 %
Lymphs Abs: 2.1 10*3/uL (ref 0.7–4.0)
MCH: 29.6 pg (ref 26.0–34.0)
MCHC: 33.7 g/dL (ref 30.0–36.0)
MCV: 87.9 fL (ref 80.0–100.0)
Monocytes Absolute: 0.8 10*3/uL (ref 0.1–1.0)
Monocytes Relative: 15 %
Neutro Abs: 2.5 10*3/uL (ref 1.7–7.7)
Neutrophils Relative %: 44 %
Platelets: 273 10*3/uL (ref 150–400)
RBC: 4.73 MIL/uL (ref 3.87–5.11)
RDW: 12.7 % (ref 11.5–15.5)
WBC: 5.6 10*3/uL (ref 4.0–10.5)
nRBC: 0 % (ref 0.0–0.2)

## 2021-09-01 LAB — COMPREHENSIVE METABOLIC PANEL
ALT: 77 U/L — ABNORMAL HIGH (ref 0–44)
AST: 96 U/L — ABNORMAL HIGH (ref 15–41)
Albumin: 3.7 g/dL (ref 3.5–5.0)
Alkaline Phosphatase: 54 U/L (ref 38–126)
Anion gap: 7 (ref 5–15)
BUN: 6 mg/dL — ABNORMAL LOW (ref 8–23)
CO2: 25 mmol/L (ref 22–32)
Calcium: 8.9 mg/dL (ref 8.9–10.3)
Chloride: 102 mmol/L (ref 98–111)
Creatinine, Ser: 0.35 mg/dL — ABNORMAL LOW (ref 0.44–1.00)
GFR, Estimated: >60 mL/min (ref >60)
Glucose, Bld: 98 mg/dL (ref 70–99)
Potassium: 3.7 mmol/L (ref 3.5–5.1)
Sodium: 134 mmol/L — ABNORMAL LOW (ref 135–145)
Total Bilirubin: 0.7 mg/dL (ref 0.3–1.2)
Total Protein: 6.9 g/dL (ref 6.5–8.1)

## 2021-09-01 MED ORDER — SODIUM CHLORIDE 0.9 % IV SOLN
375.0000 mg/m2 | Freq: Once | INTRAVENOUS | Status: AC
  Administered 2021-09-01: 600 mg via INTRAVENOUS
  Filled 2021-09-01: qty 50

## 2021-09-01 MED ORDER — DIPHENHYDRAMINE HCL 25 MG PO CAPS
50.0000 mg | ORAL_CAPSULE | Freq: Once | ORAL | Status: AC
  Administered 2021-09-01: 50 mg via ORAL
  Filled 2021-09-01: qty 2

## 2021-09-01 MED ORDER — SODIUM CHLORIDE 0.9% FLUSH
10.0000 mL | Freq: Once | INTRAVENOUS | Status: AC
  Administered 2021-09-01: 10 mL via INTRAVENOUS
  Filled 2021-09-01: qty 10

## 2021-09-01 MED ORDER — SODIUM CHLORIDE 0.9 % IV SOLN
Freq: Once | INTRAVENOUS | Status: AC
  Filled 2021-09-01: qty 250

## 2021-09-01 MED ORDER — HEPARIN SOD (PORK) LOCK FLUSH 100 UNIT/ML IV SOLN
500.0000 [IU] | Freq: Once | INTRAVENOUS | Status: AC | PRN
  Filled 2021-09-01: qty 5

## 2021-09-01 MED ORDER — ACETAMINOPHEN 325 MG PO TABS
650.0000 mg | ORAL_TABLET | Freq: Once | ORAL | Status: AC
  Administered 2021-09-01: 650 mg via ORAL
  Filled 2021-09-01: qty 2

## 2021-09-01 MED ORDER — HEPARIN SOD (PORK) LOCK FLUSH 100 UNIT/ML IV SOLN
INTRAVENOUS | Status: AC
  Administered 2021-09-01: 500 [IU]
  Filled 2021-09-01: qty 5

## 2021-09-01 NOTE — Progress Notes (Signed)
Has noticed some swelling in feet.

## 2021-09-01 NOTE — Progress Notes (Signed)
[facility] at [place], [place] [address] [phone]   Interval Evaluation  Date of Service: 09/01/21 Patient Name: Audrey Barron Patient MRN: [id number] Patient DOB: 12/02/1951 Provider: Ventura Sellers, MD  Identifying Statement:  Audrey Barron is a 70 y.o. female with  left hemispheric   Primary CNS Lymphoma    Oncologic History: Oncology History  Primary CNS lymphoma ([place])  04/28/2021 Surgery   Open biopsy with Dr. 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  Patient will transition to q2 week Rituximab in interim    06/27/2021 -  Chemotherapy    Patient is on Treatment Plan: BRAIN GLIOBLASTOMA CONSOLIDATION TEMOZOLOMIDE DAYS 1-5 Q28 DAYS    Patient is on Antibody Plan: NON-HODGKINS LYMPHOMA RITUXIMAB Q21D       Biomarkers:  CD20 positive .  Bcl-6 positive  Ki-67 95%      Interval History:  Audrey Barron presents today for follow up and Rituximab infusion.  She dosed first cycle of Temodar starting on 08/22/21, denies any complication or side effect from therapy.  She otherwise denies new or progressive neurologic deficits today.  Continues on fluid restriction and salt tabs.  She continues to walk on her own throughout the home.  For ADLs, she is independent with feeding and toileting but requires assistance for all other activities.  Denies seizures, headaches.    H+P (05/05/21) Patient presented to medical attention in April, with 2-3 months history of confusion, impaired speech, balance and gait impairment.  Neurologist ordered an MRI, which demonstrated infiltrative tumor within  much of left hemisphere.  She underwent craniotomy and open biopsy on 04/28/21 with Dr. Izora Ribas at [facility], which yielded a B-cell CNS Lymphoma.  Following surgery, she has continued to experience difficulty speaking, weakness on the right side, and poor balance.  She is unable to walk without assistance at home.  At present she is reliant on her daughter for help with dressing, cooking, tolieting; a majority of activities of daily living.  She has been dosing decadron 67m three times per day; this has not clearly led to any improvements in functional status.   Medications: Current Outpatient Medications on File Prior to Visit  Medication Sig Dispense Refill   acetaminophen (TYLENOL) 500 MG tablet Take 500 mg by mouth at bedtime.     amLODipine (NORVASC) 2.5 MG tablet Take 1 tablet (2.5 mg total) by mouth daily. 30 tablet 3   cyanocobalamin 1000 MCG tablet Take 1,000 mcg by mouth daily.     diphenhydrAMINE (BENADRYL) 50 MG capsule Take 1 capsule (50 mg total) by mouth at bedtime as needed for sleep. 30 capsule 0   ondansetron (ZOFRAN) 8 MG tablet Take 1 tablet (8 mg total) by mouth 2 (two) times daily as needed (nausea and vomiting). May take 30-60 minutes prior to Temodar administration if nausea/vomiting occurs. 30 tablet 1   pantoprazole (PROTONIX) 40 MG tablet Take by mouth.     sodium chloride 1 g tablet Take 1 tablet (1 g total) by mouth 2 (two) times daily with a meal. 60 tablet 3   temozolomide (TEMODAR) 100 MG capsule Take 1 capsule (100 mg total) by  mouth daily. May take on an empty stomach to decrease nausea & vomiting. 5 capsule 0   temozolomide (TEMODAR) 140 MG capsule Take 1 capsule (140 mg total) by mouth daily. May take on an empty stomach to decrease nausea & vomiting. 5 capsule 0   No current facility-administered medications on file prior to visit.    Allergies: No Known Allergies Past Medical History:  Past Medical History:  Diagnosis Date   Cancer ([place])    lymphoma    Dementia ([place])    Past Surgical History:  Past Surgical History:  Procedure Laterality Date   APPLICATION OF CRANIAL NAVIGATION N/A 04/28/2021   Procedure: APPLICATION OF CRANIAL NAVIGATION;  Surgeon: Meade Maw, MD;  Location: [facility] ORS;  Service: Neurosurgery;  Laterality: N/A;   CRANIOTOMY Left 04/28/2021   Procedure: CRANIOTOMY TUMOR EXCISION;  Surgeon: Meade Maw, MD;  Location: [facility] ORS;  Service: Neurosurgery;  Laterality: Left;   IR IMAGING GUIDED PORT INSERTION  05/12/2021   Social History:  Social History   Socioeconomic History   Marital status: Single    Spouse name: Not on file   Number of children: Not on file   Years of education: Not on file   Highest education level: Not on file  Occupational History   Not on file  Tobacco Use   Smoking status: Never   Smokeless tobacco: Never  Substance and Sexual Activity   Alcohol use: Not Currently   Drug use: Not Currently   Sexual activity: Not on file  Other Topics Concern   Not on file  Social History Narrative   Not on file   Social Determinants of Health   Financial Resource Strain: Not on file  Food Insecurity: Not on file  Transportation Needs: Not on file  Physical Activity: Not on file  Stress: Not on file  Social Connections: Not on file  Intimate Partner Violence: Not on file   Family History: History reviewed. No pertinent family history.  Review of Systems: Constitutional: Doesn't report fevers, chills or abnormal weight loss Eyes: Doesn't report blurriness of vision Ears, nose, mouth, throat, and face: Doesn't report sore throat Respiratory: Doesn't report cough, dyspnea or wheezes Cardiovascular: Doesn't report palpitation, chest discomfort  Gastrointestinal: +constipation GU: Doesn't report incontinence Skin: Doesn't report skin rashes Neurological: Per HPI Musculoskeletal: Doesn't report joint pain Behavioral/Psych: Doesn't report anxiety  Physical Exam: Vitals:   09/01/21  0854  BP: (!) 142/94  Pulse: (!) 105  Resp: 18  Temp: (!) 97.2 F (36.2 C)  SpO2: 94%   KPS: 60. General: Alert, cooperative, pleasant, in no acute distress Head: Normal EENT: No conjunctival injection or scleral icterus.  Lungs: Resp effort normal Cardiac: Regular rate Abdomen: Non-distended abdomen Skin: No rashes cyanosis or petechiae. Extremities: No clubbing or edema  Neurologic Exam: Mental Status: Awake, alert, attentive to examiner. Oriented to self and environment. Language is mildly impaired with regards to both fluency and comprehension.  Further mental status evaluation limited by dysphasia. Cranial Nerves: Visual acuity is grossly normal. Visual fields are full. Extra-ocular movements intact. No ptosis. Face is symmetric Motor: Tone and bulk are normal. Power is 4+/5 in right arm and leg. Reflexes are symmetric, no pathologic reflexes present.  Sensory: Intact to light touch Gait: Dystaxic   Labs: I have reviewed the data as listed    Component Value Date/Time   NA 135 08/18/2021 0841   K 3.9 08/18/2021 0841   CL 104 08/18/2021 0841   CO2 22 08/18/2021 0841  GLUCOSE 92 08/18/2021 0841   BUN 7 (L) 08/18/2021 0841   CREATININE 0.42 (L) 08/18/2021 0841   CALCIUM 8.7 (L) 08/18/2021 0841   PROT 6.0 (L) 08/18/2021 0841   ALBUMIN 3.3 (L) 08/18/2021 0841   AST 66 (H) 08/18/2021 0841   ALT 50 (H) 08/18/2021 0841   ALKPHOS 44 08/18/2021 0841   BILITOT 0.7 08/18/2021 0841   GFRNONAA >60 08/18/2021 0841   Lab Results  Component Value Date   WBC 5.6 09/01/2021   NEUTROABS 2.5 09/01/2021   HGB 14.0 09/01/2021   HCT 41.6 09/01/2021   MCV 87.9 09/01/2021   PLT 273 09/01/2021     Assessment/Plan Primary CNS lymphoma ([place]) [C85.89]  Audrey Barron is clinically stable today, now having initiated first cycle of Temozolomide+Rituximab.  No new or progressive neurologic deficits today.  She is cleared for Rituximab infusion today.    We recommended  continuing treatment with (day 11/28) cycle #1 Temozolomide 128m/m2, on for five days and off for twenty three days in twenty eight day cycles. The patient will have a complete blood count performed on days 21 and 28 of each cycle, and a comprehensive metabolic panel performed on day 28 of each cycle. Labs may need to be performed more often. Zofran will prescribed for home use for nausea/vomiting.   Methotrexate will continue to be withheld because of fluid restriction, SIADH, significant volume of hypotonic fluids required with safe MTX administration.  Chemotherapy should be held for the following:  ANC less than 1,000  Platelets less than 100,000  LFT or creatinine greater than 2x ULN  If clinical concerns/contraindications develop  We ask that BJunita Pushreturn to clinic in 2 weeks for initiation of cycle #2, or sooner if needed.  We will recommend repeat brain MRI after cycle #2 of TMZ.  All questions were answered. The patient knows to call the clinic with any problems, questions or concerns. No barriers to learning were detected.  The total time spent in the encounter was 30 minutes and more than 50% was on counseling and review of test results   ZVentura Sellers MD Medical Director of Neuro-Oncology [facility]09/09/22 9:05 AM

## 2021-09-01 NOTE — Patient Instructions (Signed)
[facility]  Discharge Instructions: Thank you for choosing [facility] to provide your oncology and hematology care.  If you have a lab appointment with the [facility], please go directly to the [facility] and check in at the registration area.  Wear comfortable clothing and clothing appropriate for easy access to any Portacath or PICC line.   We strive to give you quality time with your provider. You may need to reschedule your appointment if you arrive late (15 or more minutes).  Arriving late affects you and other patients whose appointments are after yours.  Also, if you miss three or more appointments without notifying the office, you may be dismissed from the clinic at the provider's discretion.      For prescription refill requests, have your pharmacy contact our office and allow 72 hours for refills to be completed.    Today you received the following chemotherapy and/or immunotherapy agents, Rituxan    To help prevent nausea and vomiting after your treatment, we encourage you to take your nausea medication as directed.  BELOW ARE SYMPTOMS THAT SHOULD BE REPORTED IMMEDIATELY: *FEVER GREATER THAN 100.4 F (38 C) OR HIGHER *CHILLS OR SWEATING *NAUSEA AND VOMITING THAT IS NOT CONTROLLED WITH YOUR NAUSEA MEDICATION *UNUSUAL SHORTNESS OF BREATH *UNUSUAL BRUISING OR BLEEDING *URINARY PROBLEMS (pain or burning when urinating, or frequent urination) *BOWEL PROBLEMS (unusual diarrhea, constipation, pain near the anus) TENDERNESS IN MOUTH AND THROAT WITH OR WITHOUT PRESENCE OF ULCERS (sore throat, sores in mouth, or a toothache) UNUSUAL RASH, SWELLING OR PAIN  UNUSUAL VAGINAL DISCHARGE OR ITCHING   Items with * indicate a potential emergency and should be followed up as soon as possible or go to the Emergency Department if any problems should occur.  Please show the CHEMOTHERAPY ALERT CARD or IMMUNOTHERAPY ALERT CARD at check-in to  the Emergency Department and triage nurse.  Should you have questions after your visit or need to cancel or reschedule your appointment, please contact [facility]  [phone] and follow the prompts.  Office hours are 8:00 a.m. to 4:30 p.m. Monday - Friday. Please note that voicemails left after 4:00 p.m. may not be returned until the following business day.  We are closed weekends and major holidays. You have access to a nurse at all times for urgent questions. Please call the main number to the clinic [phone] and follow the prompts.  For any non-urgent questions, you may also contact your provider using MyChart. We now offer e-Visits for anyone 38 and older to request care online for non-urgent symptoms. For details visit [email or web address].   Also download the MyChart app! Go to the app store, search "MyChart", open the app, select [facility], and log in with your MyChart username and password.  Due to Covid, a mask is required upon entering the hospital/clinic. If you do not have a mask, one will be given to you upon arrival. For doctor visits, patients may have 1 support person aged 27 or older with them. For treatment visits, patients cannot have anyone with them due to current Covid guidelines and our immunocompromised population.

## 2021-09-06 ENCOUNTER — Other Ambulatory Visit: Payer: Self-pay | Admitting: Internal Medicine

## 2021-09-06 DIAGNOSIS — C8589 Other specified types of non-Hodgkin lymphoma, extranodal and solid organ sites: Secondary | ICD-10-CM

## 2021-09-11 ENCOUNTER — Other Ambulatory Visit: Payer: Medicare HMO | Admitting: Primary Care

## 2021-09-11 ENCOUNTER — Other Ambulatory Visit: Payer: Self-pay

## 2021-09-12 ENCOUNTER — Other Ambulatory Visit: Payer: Self-pay

## 2021-09-12 ENCOUNTER — Other Ambulatory Visit: Payer: Medicare HMO | Admitting: Primary Care

## 2021-09-12 DIAGNOSIS — R413 Other amnesia: Secondary | ICD-10-CM

## 2021-09-12 DIAGNOSIS — Z515 Encounter for palliative care: Secondary | ICD-10-CM

## 2021-09-12 DIAGNOSIS — C8339 Primary central nervous system lymphoma: Secondary | ICD-10-CM

## 2021-09-12 DIAGNOSIS — C8589 Other specified types of non-Hodgkin lymphoma, extranodal and solid organ sites: Secondary | ICD-10-CM

## 2021-09-12 NOTE — Progress Notes (Signed)
[facility] Palliative Care Consult Note Telephone: [phone]  Fax: [phone]    Date of encounter: 09/12/21 3:42 PM PATIENT NAME: Audrey Barron [id number]   [phone] (home)  DOB: 01-30-1951 MRN: [id number] PRIMARY CARE PROVIDER:    Marguerita Merles, MD,  [place] [id number] [phone]  REFERRING PROVIDER:   Marguerita Barron, [place],   [address] [phone]  RESPONSIBLE PARTY:    Contact Information     Name Relation Home Work Mobile   Audrey Barron Daughter [phone]  [phone]   Audrey Barron, Audrey Barron [phone]  [phone]   Audrey Barron, Audrey Barron Sister [phone]  [phone]        I met face to face with patient and family in  home. Palliative Care was asked to follow this patient by consultation request of  Audrey Merles, MD to address advance care planning and complex medical decision making. This is a follow up visit.                                   ASSESSMENT AND PLAN / RECOMMENDATIONS:   Advance Care Planning/Goals of Care: Goals include to maximize quality of life and symptom management. Our advance care planning conversation included a discussion about:    The value and importance of advance care planning  Experiences with loved ones who have been seriously ill or have died  Exploration of personal, cultural or spiritual beliefs that might influence medical decisions  Exploration of goals of care in the event of a sudden injury or illness  Identification  of a healthcare agent - Daughter Audrey Barron creation of an  advance directive document - MOST with full scope of interventions, use of abx, use of iv, no feeding tubes.  The rational for completing a MOST form was discussed; patient and family expressed interest in completing a MOST form. Discussed and reviewed sections of the form in detail, opportunity for  questions given and all questions answered. Form completed and signed with patient/family, signed form left with patient to keep, copy of MOST form uploaded to [facility] EMR.  CODE STATUS: Full code  Symptom Management/Plan:  Nutrition:Endorses good appetite. 139 lbs. Has gained 20 lbs. Has gained since coming to live with daughter. Was forgetting to eat. Now on cap and receiving ensure supplements as well   Mobility: Walking without device, inside and out. No falls. Has not attempted elopement at this time. Has someone with her at all times, daughter's father cares for her while daughter is at work.   Goals of care; Cont to receive chemo and supportive modalities. For f/u scan in 2 weeks. Dgt reports improvement in sx with chemo regimen.   Follow up Palliative Care Visit: Palliative care will continue to follow for complex medical decision making, advance care planning, and clarification of goals. Return 12 weeks or prn.  I spent 40 minutes providing this consultation. More than 50% of the time in this consultation was spent in counseling and care coordination.  PPS: 50%  HOSPICE ELIGIBILITY/DIAGNOSIS: no  Chief Complaint: mobility and nutrition  HISTORY OF PRESENT ILLNESS:  Audrey Barron is a 70 y.o. year old female  with CNS lymphoma, memory loss, h/o hyponatremia.   History obtained from review of EMR, discussion with primary team, and interview with family, facility staff/caregiver and/or Audrey Barron.  I reviewed available labs,  medications, imaging, studies and related documents from the EMR.  Records reviewed and summarized above.   ROS  General: NAD EYES: denies vision changes, uses otc glasses  ENMT: denies dysphagia Cardiovascular: denies chest pain, denies DOE Pulmonary: denies cough, denies increased SOB Abdomen: endorses good appetite, denies constipation, endorses continence of bowel GU: denies dysuria, endorses continence of urine MSK:  improved  weakness,  no falls reported Skin: denies rashes or wounds Neurological: denies pain, denies insomnia Psych: Endorses positive mood Heme/lymph/immuno: denies bruises, abnormal bleeding  Physical Exam: Current and past weights: 139 lbs, gain of 20 lbs.  Constitutional: NAD General: frail appearing, WNWD EYES: anicteric sclera, lids intact, no discharge  ENMT: intact hearing, oral mucous membranes moist, dentition intact CV: no LE edema Pulmonary: no increased work of breathing, no cough, room air Abdomen: intake 100%,no ascites MSK: mild  sarcopenia, moves all extremities, ambulatory Skin: warm and dry, no rashes or wounds on visible skin Neuro:  mild generalized weakness, moderate  cognitive impairment Psych: non-anxious affect, A and O x 3 Hem/lymph/immuno: no widespread bruising   Thank you for the opportunity to participate in the care of Audrey Barron.  The palliative care team will continue to follow. Please call our office at [phone] if we can be of additional assistance.   Jason Coop, NP   COVID-19 PATIENT SCREENING TOOL Asked and negative response unless otherwise noted:   Have you had symptoms of covid, tested positive or been in contact with someone with symptoms/positive test in the past 5-10 days?

## 2021-09-13 ENCOUNTER — Other Ambulatory Visit (HOSPITAL_COMMUNITY): Payer: Self-pay

## 2021-09-13 ENCOUNTER — Other Ambulatory Visit: Payer: Self-pay | Admitting: Internal Medicine

## 2021-09-13 DIAGNOSIS — C8589 Other specified types of non-Hodgkin lymphoma, extranodal and solid organ sites: Secondary | ICD-10-CM

## 2021-09-14 ENCOUNTER — Encounter: Payer: Self-pay | Admitting: Internal Medicine

## 2021-09-14 ENCOUNTER — Other Ambulatory Visit (HOSPITAL_COMMUNITY): Payer: Self-pay

## 2021-09-14 MED ORDER — TEMOZOLOMIDE 140 MG PO CAPS
140.0000 mg | ORAL_CAPSULE | Freq: Every day | ORAL | 0 refills | Status: DC
  Filled 2021-09-14: qty 5, 28d supply, fill #0

## 2021-09-14 MED ORDER — TEMOZOLOMIDE 100 MG PO CAPS
100.0000 mg | ORAL_CAPSULE | Freq: Every day | ORAL | 0 refills | Status: DC
  Filled 2021-09-14: qty 5, 28d supply, fill #0

## 2021-09-15 ENCOUNTER — Inpatient Hospital Stay: Payer: Medicare HMO

## 2021-09-15 ENCOUNTER — Inpatient Hospital Stay (HOSPITAL_BASED_OUTPATIENT_CLINIC_OR_DEPARTMENT_OTHER): Payer: Medicare HMO | Admitting: Internal Medicine

## 2021-09-15 ENCOUNTER — Encounter: Payer: Self-pay | Admitting: Internal Medicine

## 2021-09-15 VITALS — BP 117/92 | HR 115 | Temp 97.8°F | Resp 20 | Wt 141.1 lb

## 2021-09-15 VITALS — BP 116/83 | HR 92 | Temp 96.8°F | Resp 17

## 2021-09-15 DIAGNOSIS — C8589 Other specified types of non-Hodgkin lymphoma, extranodal and solid organ sites: Secondary | ICD-10-CM

## 2021-09-15 DIAGNOSIS — Z5112 Encounter for antineoplastic immunotherapy: Secondary | ICD-10-CM | POA: Diagnosis not present

## 2021-09-15 LAB — CBC WITH DIFFERENTIAL/PLATELET
Abs Immature Granulocytes: 0.02 10*3/uL (ref 0.00–0.07)
Basophils Absolute: 0 10*3/uL (ref 0.0–0.1)
Basophils Relative: 0 %
Eosinophils Absolute: 0.1 10*3/uL (ref 0.0–0.5)
Eosinophils Relative: 3 %
HCT: 42.4 % (ref 36.0–46.0)
Hemoglobin: 14.3 g/dL (ref 12.0–15.0)
Immature Granulocytes: 0 %
Lymphocytes Relative: 46 %
Lymphs Abs: 2 10*3/uL (ref 0.7–4.0)
MCH: 29.5 pg (ref 26.0–34.0)
MCHC: 33.7 g/dL (ref 30.0–36.0)
MCV: 87.4 fL (ref 80.0–100.0)
Monocytes Absolute: 0.7 10*3/uL (ref 0.1–1.0)
Monocytes Relative: 15 %
Neutro Abs: 1.6 10*3/uL — ABNORMAL LOW (ref 1.7–7.7)
Neutrophils Relative %: 36 %
Platelets: 271 10*3/uL (ref 150–400)
RBC: 4.85 MIL/uL (ref 3.87–5.11)
RDW: 12.2 % (ref 11.5–15.5)
WBC: 4.5 10*3/uL (ref 4.0–10.5)
nRBC: 0 % (ref 0.0–0.2)

## 2021-09-15 LAB — COMPREHENSIVE METABOLIC PANEL
ALT: 112 U/L — ABNORMAL HIGH (ref 0–44)
AST: 136 U/L — ABNORMAL HIGH (ref 15–41)
Albumin: 3.7 g/dL (ref 3.5–5.0)
Alkaline Phosphatase: 60 U/L (ref 38–126)
Anion gap: 10 (ref 5–15)
BUN: 7 mg/dL — ABNORMAL LOW (ref 8–23)
CO2: 24 mmol/L (ref 22–32)
Calcium: 9.2 mg/dL (ref 8.9–10.3)
Chloride: 100 mmol/L (ref 98–111)
Creatinine, Ser: 0.56 mg/dL (ref 0.44–1.00)
GFR, Estimated: >60 mL/min (ref >60)
Glucose, Bld: 123 mg/dL — ABNORMAL HIGH (ref 70–99)
Potassium: 3.9 mmol/L (ref 3.5–5.1)
Sodium: 134 mmol/L — ABNORMAL LOW (ref 135–145)
Total Bilirubin: 1 mg/dL (ref 0.3–1.2)
Total Protein: 6.8 g/dL (ref 6.5–8.1)

## 2021-09-15 MED ORDER — SODIUM CHLORIDE 0.9% FLUSH
10.0000 mL | Freq: Once | INTRAVENOUS | Status: AC
  Administered 2021-09-15: 10 mL via INTRAVENOUS
  Filled 2021-09-15: qty 10

## 2021-09-15 MED ORDER — ACETAMINOPHEN 325 MG PO TABS
650.0000 mg | ORAL_TABLET | Freq: Once | ORAL | Status: AC
  Administered 2021-09-15: 650 mg via ORAL
  Filled 2021-09-15: qty 2

## 2021-09-15 MED ORDER — DIPHENHYDRAMINE HCL 25 MG PO CAPS
50.0000 mg | ORAL_CAPSULE | Freq: Once | ORAL | Status: AC
  Administered 2021-09-15: 50 mg via ORAL
  Filled 2021-09-15: qty 2

## 2021-09-15 MED ORDER — HEPARIN SOD (PORK) LOCK FLUSH 100 UNIT/ML IV SOLN
500.0000 [IU] | Freq: Once | INTRAVENOUS | Status: AC | PRN
  Filled 2021-09-15: qty 5

## 2021-09-15 MED ORDER — SODIUM CHLORIDE 0.9 % IV SOLN
375.0000 mg/m2 | Freq: Once | INTRAVENOUS | Status: AC
  Administered 2021-09-15: 600 mg via INTRAVENOUS
  Filled 2021-09-15: qty 50

## 2021-09-15 MED ORDER — HEPARIN SOD (PORK) LOCK FLUSH 100 UNIT/ML IV SOLN
INTRAVENOUS | Status: AC
  Administered 2021-09-15: 500 [IU]
  Filled 2021-09-15: qty 5

## 2021-09-15 MED ORDER — SODIUM CHLORIDE 0.9 % IV SOLN
Freq: Once | INTRAVENOUS | Status: AC
  Filled 2021-09-15: qty 250

## 2021-09-15 NOTE — Progress Notes (Signed)
Per Dr. Mickeal Skinner okay to proceed with Rituximab with AST 136 and ALT 112.

## 2021-09-15 NOTE — Progress Notes (Signed)
[facility] at [place], [place] [address] [phone]   Interval Evaluation  Date of Service: 09/15/21 Patient Name: Audrey Barron Patient MRN: [id number] Patient DOB: 11/07/1951 Provider: Ventura Sellers, MD  Identifying Statement:  Audrey Barron is a 70 y.o. female with  left hemispheric   Primary CNS Lymphoma    Oncologic History: Oncology History  Primary CNS lymphoma ([place])  04/28/2021 Surgery   Open biopsy with Dr. 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  Patient will transition to q2 week Rituximab in interim    07/21/2021 - 08/18/2021 Chemotherapy   Rituximab monotherapy   08/22/2021 -  Chemotherapy   Initiates first cycle of monthly 5-day Temozolomide, co-administered with Rituxmab q2 weeks.      Biomarkers:  CD20 positive .  Bcl-6 positive  Ki-67 95%      Interval History:  Audrey Barron presents today for follow up and Rituximab infusion.  She has now completed first cycle of Temodar, denies any complication or side effect from therapy.  She otherwise denies new or progressive neurologic deficits today.  Continues on fluid restriction and salt tabs.  She continues to walk on her own throughout the home.  For ADLs, she is independent with feeding and toileting but requires assistance for all other activities.  No seizures or headaches reported this week.  H+P (05/05/21) Patient presented to medical attention in April, with 2-3 months history of confusion, impaired speech, balance and gait impairment.  Neurologist ordered an MRI, which demonstrated infiltrative tumor within much of left hemisphere.  She underwent craniotomy and open biopsy on  04/28/21 with Dr. Izora Ribas at [facility], which yielded a B-cell CNS Lymphoma.  Following surgery, she has continued to experience difficulty speaking, weakness on the right side, and poor balance.  She is unable to walk without assistance at home.  At present she is reliant on her daughter for help with dressing, cooking, tolieting; a majority of activities of daily living.  She has been dosing decadron 54m three times per day; this has not clearly led to any improvements in functional status.   Medications: Current Outpatient Medications on File Prior to Visit  Medication Sig Dispense Refill   acetaminophen (TYLENOL) 500 MG tablet Take 500 mg by mouth at bedtime.     amLODipine (NORVASC) 2.5 MG tablet Take 1 tablet (2.5 mg total) by mouth daily. 30 tablet 3   cyanocobalamin 1000 MCG tablet Take 1,000 mcg by mouth daily.     diphenhydrAMINE (BENADRYL) 50 MG capsule Take 1 capsule (50 mg total) by mouth at bedtime as needed for sleep. 30 capsule 0   ondansetron (ZOFRAN) 8 MG tablet Take 1 tablet (8 mg total) by mouth 2 (two) times daily as needed (nausea and vomiting). May take 30-60 minutes prior to Temodar administration if nausea/vomiting occurs. 30 tablet 1   pantoprazole (PROTONIX) 40 MG tablet TAKE ONE TABLET BY MOUTH AT BEDTIME. 30 tablet 11   sodium chloride 1 g tablet Take 1 tablet (1 g total) by mouth 2 (two) times daily with a meal. 60 tablet 3   temozolomide (TEMODAR) 100 MG capsule Take 1 capsule (100 mg total) by mouth daily. May take on an empty stomach to decrease nausea & vomiting. 5  capsule 0   temozolomide (TEMODAR) 140 MG capsule Take 1 capsule (140 mg total) by mouth daily. May take on an empty stomach to decrease nausea & vomiting. 5 capsule 0   No current facility-administered medications on file prior to visit.    Allergies: No Known Allergies Past Medical History:  Past Medical History:  Diagnosis Date   Cancer ([place])    lymphoma   Dementia ([place])    Past Surgical  History:  Past Surgical History:  Procedure Laterality Date   APPLICATION OF CRANIAL NAVIGATION N/A 04/28/2021   Procedure: APPLICATION OF CRANIAL NAVIGATION;  Surgeon: Meade Maw, MD;  Location: [facility] ORS;  Service: Neurosurgery;  Laterality: N/A;   CRANIOTOMY Left 04/28/2021   Procedure: CRANIOTOMY TUMOR EXCISION;  Surgeon: Meade Maw, MD;  Location: [facility] ORS;  Service: Neurosurgery;  Laterality: Left;   IR IMAGING GUIDED PORT INSERTION  05/12/2021   Social History:  Social History   Socioeconomic History   Marital status: Single    Spouse name: Not on file   Number of children: Not on file   Years of education: Not on file   Highest education level: Not on file  Occupational History   Not on file  Tobacco Use   Smoking status: Never   Smokeless tobacco: Never  Substance and Sexual Activity   Alcohol use: Not Currently   Drug use: Not Currently   Sexual activity: Not on file  Other Topics Concern   Not on file  Social History Narrative   Not on file   Social Determinants of Health   Financial Resource Strain: Not on file  Food Insecurity: Not on file  Transportation Needs: Not on file  Physical Activity: Not on file  Stress: Not on file  Social Connections: Not on file  Intimate Partner Violence: Not on file   Family History: History reviewed. No pertinent family history.  Review of Systems: Constitutional: Doesn't report fevers, chills or abnormal weight loss Eyes: Doesn't report blurriness of vision Ears, nose, mouth, throat, and face: Doesn't report sore throat Respiratory: Doesn't report cough, dyspnea or wheezes Cardiovascular: Doesn't report palpitation, chest discomfort  Gastrointestinal: +constipation GU: Doesn't report incontinence Skin: Doesn't report skin rashes Neurological: Per HPI Musculoskeletal: Doesn't report joint pain Behavioral/Psych: Doesn't report anxiety  Physical Exam: Vitals:   09/15/21 0850  BP: (!) 117/92  Pulse: (!)  115  Resp: 20  Temp: 97.8 F (36.6 C)  SpO2: 100%   KPS: 60. General: Alert, cooperative, pleasant, in no acute distress Head: Normal EENT: No conjunctival injection or scleral icterus.  Lungs: Resp effort normal Cardiac: Regular rate Abdomen: Non-distended abdomen Skin: No rashes cyanosis or petechiae. Extremities: No clubbing or edema  Neurologic Exam: Mental Status: Awake, alert, attentive to examiner. Oriented to self and environment. Language is mildly impaired with regards to both fluency and comprehension.  Further mental status evaluation limited by dysphasia. Cranial Nerves: Visual acuity is grossly normal. Visual fields are full. Extra-ocular movements intact. No ptosis. Face is symmetric Motor: Tone and bulk are normal. Power is 4+/5 in right arm and leg. Reflexes are symmetric, no pathologic reflexes present.  Sensory: Intact to light touch Gait: Dystaxic   Labs: I have reviewed the data as listed    Component Value Date/Time   NA 134 (L) 09/15/2021 0833   K 3.9 09/15/2021 0833   CL 100 09/15/2021 0833   CO2 24 09/15/2021 0833   GLUCOSE 123 (H) 09/15/2021 0833   BUN 7 (L) 09/15/2021 8185  CREATININE 0.56 09/15/2021 0833   CALCIUM 9.2 09/15/2021 0833   PROT 6.8 09/15/2021 0833   ALBUMIN 3.7 09/15/2021 0833   AST 136 (H) 09/15/2021 0833   ALT 112 (H) 09/15/2021 0833   ALKPHOS 60 09/15/2021 0833   BILITOT 1.0 09/15/2021 0833   GFRNONAA >60 09/15/2021 0833   Lab Results  Component Value Date   WBC 4.5 09/15/2021   NEUTROABS 1.6 (L) 09/15/2021   HGB 14.3 09/15/2021   HCT 42.4 09/15/2021   MCV 87.4 09/15/2021   PLT 271 09/15/2021     Assessment/Plan Primary CNS lymphoma ([place]) [C85.89]  Gillie Fleites is clinically stable today, now having completed first cycle of Temozolomide+Rituximab.  No new or progressive neurologic deficits today.  She is cleared for Rituximab infusion today.    We recommended continuing treatment with cycle #2 Temozolomide  154m/m2, on for five days and off for twenty three days in twenty eight day cycles. The patient will have a complete blood count performed on days 21 and 28 of each cycle, and a comprehensive metabolic panel performed on day 28 of each cycle. Labs may need to be performed more often. Zofran will prescribed for home use for nausea/vomiting.   Methotrexate will continue to be withheld because of fluid restriction, SIADH, significant volume of hypotonic fluids required with safe MTX administration.  Chemotherapy should be held for the following:  ANC less than 1,000  Platelets less than 100,000  LFT or creatinine greater than 2x ULN  If clinical concerns/contraindications develop  We ask that BJunita Pushreturn to clinic in 2 weeks for next rituxan infusion, or sooner if needed.  MRI brain will be scheduled for 4 weeks.  If stable, we will discontinue the rituximab and transition to Temozolomide consolidation monotherapy.    All questions were answered. The patient knows to call the clinic with any problems, questions or concerns. No barriers to learning were detected.  The total time spent in the encounter was 30 minutes and more than 50% was on counseling and review of test results   ZVentura Sellers MD Medical Director of Neuro-[facility]cology [facility]09/23/22 9:01 AM

## 2021-09-15 NOTE — Patient Instructions (Signed)
[facility] ONCOL[facility]  Discharge Instructions: Thank you for choosing [facility] to provide your oncology and hematology care.  If you have a lab appointment with the [facility], please go directly to the [facility] and check in at the registration area.  Wear comfortable clothing and clothing appropriate for easy access to any Portacath or PICC line.   We strive to give you quality time with your provider. You may need to reschedule your appointment if you arrive late (15 or more minutes).  Arriving late affects you and other patients whose appointments are after yours.  Also, if you miss three or more appointments without notifying the office, you may be dismissed from the clinic at the provider's discretion.      For prescription refill requests, have your pharmacy contact our office and allow 72 hours for refills to be completed.    Today you received the following chemotherapy and/or immunotherapy agents Rituximab       To help prevent nausea and vomiting after your treatment, we encourage you to take your nausea medication as directed.  BELOW ARE SYMPTOMS THAT SHOULD BE REPORTED IMMEDIATELY: *FEVER GREATER THAN 100.4 F (38 C) OR HIGHER *CHILLS OR SWEATING *NAUSEA AND VOMITING THAT IS NOT CONTROLLED WITH YOUR NAUSEA MEDICATION *UNUSUAL SHORTNESS OF BREATH *UNUSUAL BRUISING OR BLEEDING *URINARY PROBLEMS (pain or burning when urinating, or frequent urination) *BOWEL PROBLEMS (unusual diarrhea, constipation, pain near the anus) TENDERNESS IN MOUTH AND THROAT WITH OR WITHOUT PRESENCE OF ULCERS (sore throat, sores in mouth, or a toothache) UNUSUAL RASH, SWELLING OR PAIN  UNUSUAL VAGINAL DISCHARGE OR ITCHING   Items with * indicate a potential emergency and should be followed up as soon as possible or go to the Emergency Department if any problems should occur.  Please show the CHEMOTHERAPY ALERT CARD or IMMUNOTHERAPY ALERT CARD at check-in  to the Emergency Department and triage nurse.  Should you have questions after your visit or need to cancel or reschedule your appointment, please contact [facility]  [phone] and follow the prompts.  Office hours are 8:00 a.m. to 4:30 p.m. Monday - Friday. Please note that voicemails left after 4:00 p.m. may not be returned until the following business day.  We are closed weekends and major holidays. You have access to a nurse at all times for urgent questions. Please call the main number to the clinic [phone] and follow the prompts.  For any non-urgent questions, you may also contact your provider using MyChart. We now offer e-Visits for anyone 61 and older to request care online for non-urgent symptoms. For details visit [email or web address].   Also download the MyChart app! Go to the app store, search "MyChart", open the app, select [facility], and log in with your MyChart username and password.  Due to Covid, a mask is required upon entering the hospital/clinic. If you do not have a mask, one will be given to you upon arrival. For doctor visits, patients may have 1 support person aged 21 or older with them. For treatment visits, patients cannot have anyone with them due to current Covid guidelines and our immunocompromised population.

## 2021-09-18 ENCOUNTER — Other Ambulatory Visit (HOSPITAL_COMMUNITY): Payer: Self-pay

## 2021-09-19 ENCOUNTER — Other Ambulatory Visit (HOSPITAL_COMMUNITY): Payer: Self-pay

## 2021-09-20 ENCOUNTER — Other Ambulatory Visit: Payer: Self-pay

## 2021-09-20 ENCOUNTER — Other Ambulatory Visit (HOSPITAL_COMMUNITY): Payer: Self-pay

## 2021-09-27 ENCOUNTER — Other Ambulatory Visit: Payer: Self-pay

## 2021-09-27 DIAGNOSIS — C8589 Other specified types of non-Hodgkin lymphoma, extranodal and solid organ sites: Secondary | ICD-10-CM

## 2021-09-29 ENCOUNTER — Encounter: Payer: Self-pay | Admitting: Internal Medicine

## 2021-09-29 ENCOUNTER — Inpatient Hospital Stay: Payer: Medicare HMO

## 2021-09-29 ENCOUNTER — Inpatient Hospital Stay (HOSPITAL_BASED_OUTPATIENT_CLINIC_OR_DEPARTMENT_OTHER): Payer: Medicare HMO | Admitting: Internal Medicine

## 2021-09-29 ENCOUNTER — Inpatient Hospital Stay: Payer: Medicare HMO | Attending: Internal Medicine

## 2021-09-29 ENCOUNTER — Other Ambulatory Visit: Payer: Self-pay

## 2021-09-29 VITALS — BP 104/87 | HR 105 | Temp 96.3°F | Wt 144.2 lb

## 2021-09-29 VITALS — BP 89/65 | HR 88 | Temp 97.0°F | Resp 17

## 2021-09-29 DIAGNOSIS — Z5112 Encounter for antineoplastic immunotherapy: Secondary | ICD-10-CM | POA: Insufficient documentation

## 2021-09-29 DIAGNOSIS — C8589 Other specified types of non-Hodgkin lymphoma, extranodal and solid organ sites: Secondary | ICD-10-CM

## 2021-09-29 DIAGNOSIS — C8510 Unspecified B-cell lymphoma, unspecified site: Secondary | ICD-10-CM | POA: Insufficient documentation

## 2021-09-29 LAB — COMPREHENSIVE METABOLIC PANEL
ALT: 79 U/L — ABNORMAL HIGH (ref 0–44)
AST: 80 U/L — ABNORMAL HIGH (ref 15–41)
Albumin: 3.7 g/dL (ref 3.5–5.0)
Alkaline Phosphatase: 59 U/L (ref 38–126)
Anion gap: 7 (ref 5–15)
BUN: 8 mg/dL (ref 8–23)
CO2: 26 mmol/L (ref 22–32)
Calcium: 9.2 mg/dL (ref 8.9–10.3)
Chloride: 99 mmol/L (ref 98–111)
Creatinine, Ser: 0.59 mg/dL (ref 0.44–1.00)
GFR, Estimated: >60 mL/min (ref >60)
Glucose, Bld: 100 mg/dL — ABNORMAL HIGH (ref 70–99)
Potassium: 4.4 mmol/L (ref 3.5–5.1)
Sodium: 132 mmol/L — ABNORMAL LOW (ref 135–145)
Total Bilirubin: 0.9 mg/dL (ref 0.3–1.2)
Total Protein: 6.9 g/dL (ref 6.5–8.1)

## 2021-09-29 LAB — CBC WITH DIFFERENTIAL/PLATELET
Abs Immature Granulocytes: 0.01 10*3/uL (ref 0.00–0.07)
Basophils Absolute: 0 10*3/uL (ref 0.0–0.1)
Basophils Relative: 0 %
Eosinophils Absolute: 0.1 10*3/uL (ref 0.0–0.5)
Eosinophils Relative: 2 %
HCT: 43.5 % (ref 36.0–46.0)
Hemoglobin: 14.5 g/dL (ref 12.0–15.0)
Immature Granulocytes: 0 %
Lymphocytes Relative: 33 %
Lymphs Abs: 1.6 10*3/uL (ref 0.7–4.0)
MCH: 29 pg (ref 26.0–34.0)
MCHC: 33.3 g/dL (ref 30.0–36.0)
MCV: 87 fL (ref 80.0–100.0)
Monocytes Absolute: 0.9 10*3/uL (ref 0.1–1.0)
Monocytes Relative: 18 %
Neutro Abs: 2.2 10*3/uL (ref 1.7–7.7)
Neutrophils Relative %: 47 %
Platelets: 240 10*3/uL (ref 150–400)
RBC: 5 MIL/uL (ref 3.87–5.11)
RDW: 12.2 % (ref 11.5–15.5)
WBC: 4.8 10*3/uL (ref 4.0–10.5)
nRBC: 0 % (ref 0.0–0.2)

## 2021-09-29 MED ORDER — SODIUM CHLORIDE 0.9% FLUSH
10.0000 mL | INTRAVENOUS | Status: AC | PRN
  Administered 2021-09-29: 10 mL via INTRAVENOUS
  Filled 2021-09-29: qty 10

## 2021-09-29 MED ORDER — HEPARIN SOD (PORK) LOCK FLUSH 100 UNIT/ML IV SOLN
INTRAVENOUS | Status: AC
  Administered 2021-09-29: 500 [IU]
  Filled 2021-09-29: qty 5

## 2021-09-29 MED ORDER — DIPHENHYDRAMINE HCL 25 MG PO CAPS
50.0000 mg | ORAL_CAPSULE | Freq: Once | ORAL | Status: AC
  Administered 2021-09-29: 50 mg via ORAL
  Filled 2021-09-29: qty 2

## 2021-09-29 MED ORDER — HEPARIN SOD (PORK) LOCK FLUSH 100 UNIT/ML IV SOLN
500.0000 [IU] | Freq: Once | INTRAVENOUS | Status: AC
  Filled 2021-09-29: qty 5

## 2021-09-29 MED ORDER — ACETAMINOPHEN 325 MG PO TABS
650.0000 mg | ORAL_TABLET | Freq: Once | ORAL | Status: AC
  Administered 2021-09-29: 650 mg via ORAL
  Filled 2021-09-29: qty 2

## 2021-09-29 MED ORDER — HEPARIN SOD (PORK) LOCK FLUSH 100 UNIT/ML IV SOLN
500.0000 [IU] | Freq: Once | INTRAVENOUS | Status: AC | PRN
  Filled 2021-09-29: qty 5

## 2021-09-29 MED ORDER — SODIUM CHLORIDE 0.9 % IV SOLN
Freq: Once | INTRAVENOUS | Status: AC
  Filled 2021-09-29: qty 250

## 2021-09-29 MED ORDER — SODIUM CHLORIDE 0.9 % IV SOLN
375.0000 mg/m2 | Freq: Once | INTRAVENOUS | Status: AC
  Administered 2021-09-29: 600 mg via INTRAVENOUS
  Filled 2021-09-29: qty 50

## 2021-09-29 NOTE — Progress Notes (Signed)
[facility] [place], [place] [address] [phone]   Interval Evaluation  Date of Service: 09/29/21 Patient Name: Audrey Barron Patient MRN: [id number] Patient DOB: 21-Apr-1951 Provider: Ventura Sellers, MD  Identifying Statement:  Audrey Barron is a 70 y.o. female with  left hemispheric   Primary CNS Lymphoma    Oncologic History: Oncology History  Primary CNS lymphoma ([place])  04/28/2021 Surgery   Open biopsy with Dr. 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  Patient will transition to q2 week Rituximab in interim    07/21/2021 - 08/18/2021 Chemotherapy   Rituximab monotherapy   08/22/2021 -  Chemotherapy   Initiates first cycle of monthly 5-day Temozolomide, co-administered with Rituxmab q2 weeks.      Biomarkers:  CD20 positive .  Bcl-6 positive  Ki-67 95%      Interval History:  Audrey Barron presents today for follow up and Rituximab infusion.  She just finished dosing her second cycle of Temodar, denies any complication or side effect from therapy.  Does describe "a little numbness, pins and needles" in her right hand for the past week or so.  She otherwise denies new or progressive neurologic deficits today.  Continues on fluid restriction and salt tabs.  She continues to walk on her own throughout the home.  For ADLs, she is independent with feeding and toileting but requires assistance for all other activities.  No seizures or headaches reported this week.  H+P (05/05/21) Patient presented to medical attention in April, with 2-3 months history of confusion, impaired speech, balance and gait impairment.  Neurologist ordered an MRI, which  demonstrated infiltrative tumor within much of left hemisphere.  She underwent craniotomy and open biopsy on 04/28/21 with Dr. Izora Ribas at [facility], which yielded a B-cell CNS Lymphoma.  Following surgery, she has continued to experience difficulty speaking, weakness on the right side, and poor balance.  She is unable to walk without assistance at home.  At present she is reliant on her daughter for help with dressing, cooking, tolieting; a majority of activities of daily living.  She has been dosing decadron 30m three times per day; this has not clearly led to any improvements in functional status.   Medications: Current Outpatient Medications on File Prior to Visit  Medication Sig Dispense Refill   acetaminophen (TYLENOL) 500 MG tablet Take 500 mg by mouth at bedtime.     amLODipine (NORVASC) 2.5 MG tablet Take 1 tablet (2.5 mg total) by mouth daily. 30 tablet 3   cyanocobalamin 1000 MCG tablet Take 1,000 mcg by mouth daily.     diphenhydrAMINE (BENADRYL) 50 MG capsule Take 1 capsule (50 mg total) by mouth at bedtime as needed for sleep. 30 capsule 0   ondansetron (ZOFRAN) 8 MG tablet Take 1 tablet (8 mg total) by mouth 2 (two) times daily as needed (nausea and vomiting). May take 30-60 minutes prior to Temodar administration if nausea/vomiting occurs. 30 tablet 1   pantoprazole (PROTONIX) 40 MG tablet TAKE ONE TABLET BY MOUTH AT BEDTIME. 30 tablet 11   sodium chloride 1 g tablet Take 1 tablet (1 g total) by mouth 2 (two) times daily with a meal. 60 tablet 3   temozolomide (TEMODAR) 100 MG capsule Take  1 capsule (100 mg total) by mouth daily. May take on an empty stomach to decrease nausea & vomiting. 5 capsule 0   temozolomide (TEMODAR) 140 MG capsule Take 1 capsule (140 mg total) by mouth daily. May take on an empty stomach to decrease nausea & vomiting. 5 capsule 0   Current Facility-Administered Medications on File Prior to Visit  Medication Dose Route Frequency Provider Last Rate Last Admin    heparin lock flush 100 unit/mL  500 Units Intravenous Once Meleni Delahunt, Acey Lav, MD       sodium chloride flush (NS) 0.9 % injection 10 mL  10 mL Intravenous PRN Geordie Nooney, Acey Lav, MD        Allergies: No Known Allergies Past Medical History:  Past Medical History:  Diagnosis Date   Cancer ([place])    lymphoma   Dementia ([place])    Past Surgical History:  Past Surgical History:  Procedure Laterality Date   APPLICATION OF CRANIAL NAVIGATION N/A 04/28/2021   Procedure: APPLICATION OF CRANIAL NAVIGATION;  Surgeon: Meade Maw, MD;  Location: [facility] ORS;  Service: Neurosurgery;  Laterality: N/A;   CRANIOTOMY Left 04/28/2021   Procedure: CRANIOTOMY TUMOR EXCISION;  Surgeon: Meade Maw, MD;  Location: [facility] ORS;  Service: Neurosurgery;  Laterality: Left;   IR IMAGING GUIDED PORT INSERTION  05/12/2021   Social History:  Social History   Socioeconomic History   Marital status: Single    Spouse name: Not on file   Number of children: Not on file   Years of education: Not on file   Highest education level: Not on file  Occupational History   Not on file  Tobacco Use   Smoking status: Never   Smokeless tobacco: Never  Substance and Sexual Activity   Alcohol use: Not Currently   Drug use: Not Currently   Sexual activity: Not on file  Other Topics Concern   Not on file  Social History Narrative   Not on file   Social Determinants of Health   Financial Resource Strain: Not on file  Food Insecurity: Not on file  Transportation Needs: Not on file  Physical Activity: Not on file  Stress: Not on file  Social Connections: Not on file  Intimate Partner Violence: Not on file   Family History: No family history on file.  Review of Systems: Constitutional: Doesn't report fevers, chills or abnormal weight loss Eyes: Doesn't report blurriness of vision Ears, nose, mouth, throat, and face: Doesn't report sore throat Respiratory: Doesn't report cough, dyspnea or  wheezes Cardiovascular: Doesn't report palpitation, chest discomfort  Gastrointestinal: +constipation GU: Doesn't report incontinence Skin: Doesn't report skin rashes Neurological: Per HPI Musculoskeletal: Doesn't report joint pain Behavioral/Psych: Doesn't report anxiety  Physical Exam: Vitals:   09/29/21 0923  BP: 104/87  Pulse: (!) 105  Temp: (!) 96.3 F (35.7 C)   KPS: 60. General: Alert, cooperative, pleasant, in no acute distress Head: Normal EENT: No conjunctival injection or scleral icterus.  Lungs: Resp effort normal Cardiac: Regular rate Abdomen: Non-distended abdomen Skin: No rashes cyanosis or petechiae. Extremities: No clubbing or edema  Neurologic Exam: Mental Status: Awake, alert, attentive to examiner. Oriented to self and environment. Language is mildly impaired with regards to both fluency and comprehension.  Further mental status evaluation limited by dysphasia. Cranial Nerves: Visual acuity is grossly normal. Visual fields are full. Extra-ocular movements intact. No ptosis. Face is symmetric Motor: Tone and bulk are normal. Power is 4+/5 in right arm and leg. Reflexes are symmetric, no pathologic reflexes  present.  Sensory: Intact to light touch Gait: Dystaxic   Labs: I have reviewed the data as listed    Component Value Date/Time   NA 134 (L) 09/15/2021 0833   K 3.9 09/15/2021 0833   CL 100 09/15/2021 0833   CO2 24 09/15/2021 0833   GLUCOSE 123 (H) 09/15/2021 0833   BUN 7 (L) 09/15/2021 0833   CREATININE 0.56 09/15/2021 0833   CALCIUM 9.2 09/15/2021 0833   PROT 6.8 09/15/2021 0833   ALBUMIN 3.7 09/15/2021 0833   AST 136 (H) 09/15/2021 0833   ALT 112 (H) 09/15/2021 0833   ALKPHOS 60 09/15/2021 0833   BILITOT 1.0 09/15/2021 0833   GFRNONAA >60 09/15/2021 0833   Lab Results  Component Value Date   WBC 4.5 09/15/2021   NEUTROABS 1.6 (L) 09/15/2021   HGB 14.3 09/15/2021   HCT 42.4 09/15/2021   MCV 87.4 09/15/2021   PLT 271 09/15/2021      Assessment/Plan Primary CNS lymphoma ([place]) [C85.89]  Audrey Barron is clinically stable today, now halfway through cycle #2 of Temozolomide, with ongoign Rituximab q2 weeks.  No new or progressive neurologic deficits today.  She is cleared for Rituximab infusion today.    We recommended continuing treatment with cycle #2 Temozolomide 157m/m2, now day 7/28, on for five days and off for twenty three days in twenty eight day cycles. The patient will have a complete blood count performed on days 21 and 28 of each cycle, and a comprehensive metabolic panel performed on day 28 of each cycle. Labs may need to be performed more often. Zofran will prescribed for home use for nausea/vomiting.   Methotrexate will continue to be withheld because of fluid restriction, SIADH, significant volume of hypotonic fluids required with safe MTX administration.  Chemotherapy should be held for the following:  ANC less than 1,000  Platelets less than 100,000  LFT or creatinine greater than 2x ULN  If clinical concerns/contraindications develop  We ask that BJunita Pushreturn to clinic in 2 weeks with MRI brain for evaluation.  If stable, we will discontinue the rituximab and transition to Temozolomide consolidation monotherapy.    All questions were answered. The patient knows to call the clinic with any problems, questions or concerns. No barriers to learning were detected.  The total time spent in the encounter was 30 minutes and more than 50% was on counseling and review of test results   ZVentura Sellers MD Medical Director of Neuro-Oncology [facility]10/07/22 9:08 AM

## 2021-09-29 NOTE — Patient Instructions (Signed)
[facility]  Discharge Instructions: Thank you for choosing [facility] to provide your oncology and hematology care.  If you have a lab appointment with the [facility], please go directly to the [facility] and check in at the registration area.  Wear comfortable clothing and clothing appropriate for easy access to any Portacath or PICC line.   We strive to give you quality time with your provider. You may need to reschedule your appointment if you arrive late (15 or more minutes).  Arriving late affects you and other patients whose appointments are after yours.  Also, if you miss three or more appointments without notifying the office, you may be dismissed from the clinic at the provider's discretion.      For prescription refill requests, have your pharmacy contact our office and allow 72 hours for refills to be completed.    Today you received the following chemotherapy and/or immunotherapy agents Rituximab       To help prevent nausea and vomiting after your treatment, we encourage you to take your nausea medication as directed.  BELOW ARE SYMPTOMS THAT SHOULD BE REPORTED IMMEDIATELY: *FEVER GREATER THAN 100.4 F (38 C) OR HIGHER *CHILLS OR SWEATING *NAUSEA AND VOMITING THAT IS NOT CONTROLLED WITH YOUR NAUSEA MEDICATION *UNUSUAL SHORTNESS OF BREATH *UNUSUAL BRUISING OR BLEEDING *URINARY PROBLEMS (pain or burning when urinating, or frequent urination) *BOWEL PROBLEMS (unusual diarrhea, constipation, pain near the anus) TENDERNESS IN MOUTH AND THROAT WITH OR WITHOUT PRESENCE OF ULCERS (sore throat, sores in mouth, or a toothache) UNUSUAL RASH, SWELLING OR PAIN  UNUSUAL VAGINAL DISCHARGE OR ITCHING   Items with * indicate a potential emergency and should be followed up as soon as possible or go to the Emergency Department if any problems should occur.  Please show the CHEMOTHERAPY ALERT CARD or IMMUNOTHERAPY ALERT CARD at check-in  to the Emergency Department and triage nurse.  Should you have questions after your visit or need to cancel or reschedule your appointment, please contact [facility]  [phone] and follow the prompts.  Office hours are 8:00 a.m. to 4:30 p.m. Monday - Friday. Please note that voicemails left after 4:00 p.m. may not be returned until the following business day.  We are closed weekends and major holidays. You have access to a nurse at all times for urgent questions. Please call the main number to the clinic [phone] and follow the prompts.  For any non-urgent questions, you may also contact your provider using MyChart. We now offer e-Visits for anyone 42 and older to request care online for non-urgent symptoms. For details visit [email or web address].   Also download the MyChart app! Go to the app store, search "MyChart", open the app, select [facility], and log in with your MyChart username and password.  Due to Covid, a mask is required upon entering the hospital/clinic. If you do not have a mask, one will be given to you upon arrival. For doctor visits, patients may have 1 support person aged 21 or older with them. For treatment visits, patients cannot have anyone with them due to current Covid guidelines and our immunocompromised population.

## 2021-10-05 ENCOUNTER — Other Ambulatory Visit: Payer: Self-pay | Admitting: Radiation Therapy

## 2021-10-10 ENCOUNTER — Ambulatory Visit
Admission: RE | Admit: 2021-10-10 | Discharge: 2021-10-10 | Disposition: A | Discharge status: Home / Self Care | Payer: Medicare HMO | Source: Ambulatory Visit | Attending: Internal Medicine | Admitting: Internal Medicine

## 2021-10-10 DIAGNOSIS — C8589 Other specified types of non-Hodgkin lymphoma, extranodal and solid organ sites: Secondary | ICD-10-CM | POA: Insufficient documentation

## 2021-10-10 IMAGING — MR MR HEAD WO/W CM
11 of 13 series · 38 of 48 positions shown · IV contrast (gadavist)
Comparison: Brain MRI [DATE]

CLINICAL DATA: Follow-up lymphoma, history of craniotomy

EXAM:
MRI HEAD WITHOUT AND WITH CONTRAST
TECHNIQUE: Multiplanar, multiecho pulse sequences of the brain and surrounding
structures were obtained without and with intravenous contrast.
CONTRAST:  6mL GADAVIST GADOBUTROL 1 MMOL/ML IV SOLN

[Series 16: cor dwi_tracew · coronal · 5.0mm · 0.68mm/px · 3 of 36 slices shown]
[im 1/36]
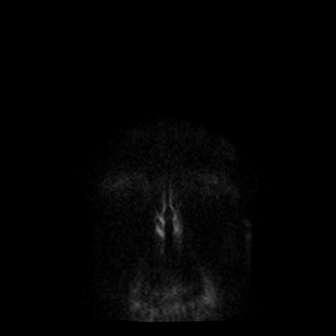
[im 18/36]
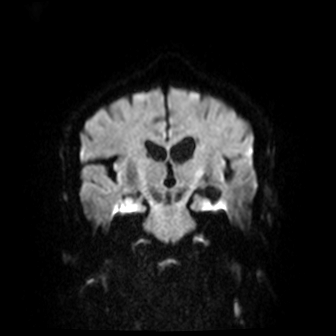
[im 36/36]
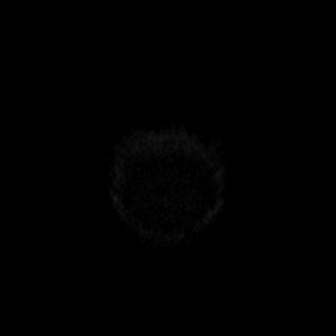

[Series 17: cor dwi_adc · coronal · 5.0mm · 0.68mm/px · 4 of 36 slices shown]
[im 1/36]
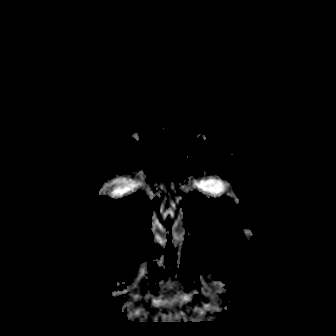
[im 12/36]
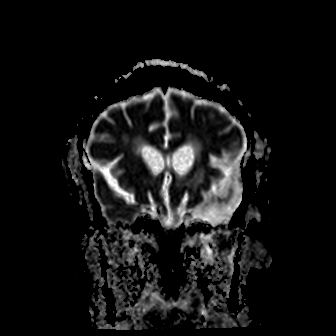
[im 24/36]
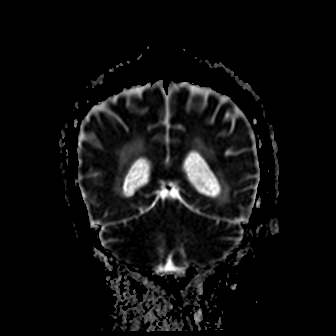
[im 36/36]
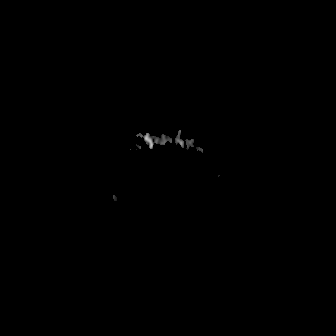

[Series 18: ax dwi_tracew · axial · 3.0mm · 0.71mm/px · z∈[-91,+23]mm · 5 of 50 slices shown]
[im 1/50]
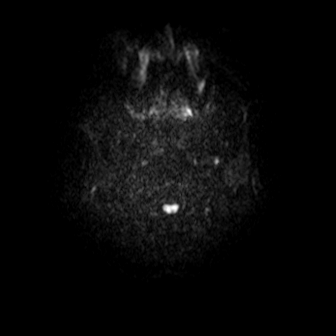
[im 10/50]
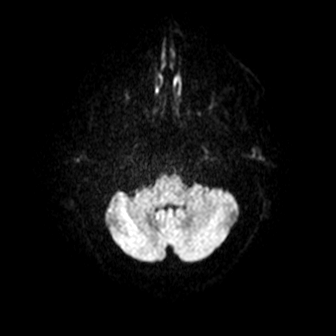
[im 20/50]
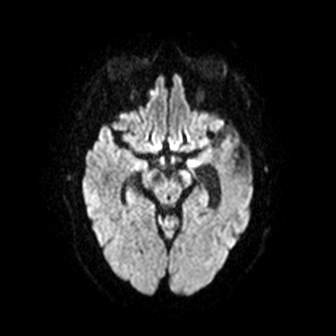
[im 30/50]
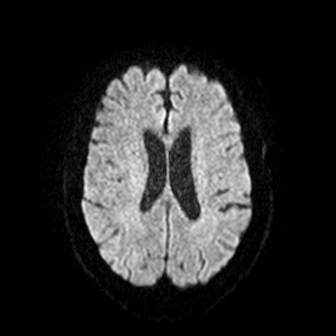
[im 40/50]
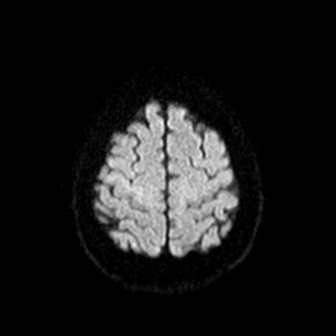

[Series 20: T2 · axial · 5.0mm · 0.45mm/px · z∈[-80,+68]mm · 3 of 26 slices shown (1 of 2)]
[im 1/26]
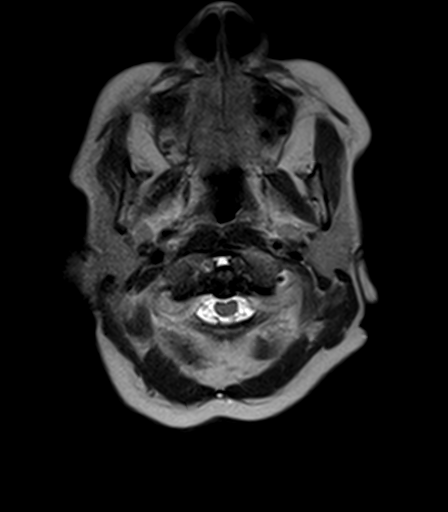
[im 13/26]
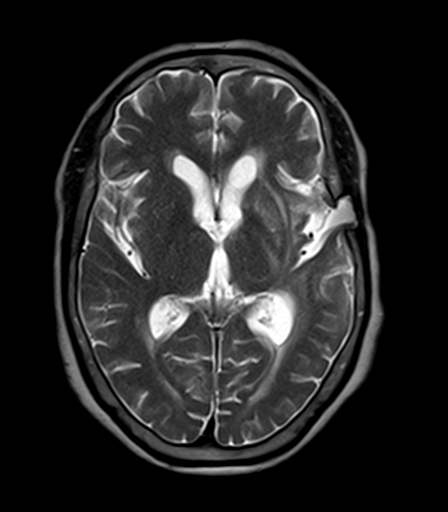
[im 26/26]
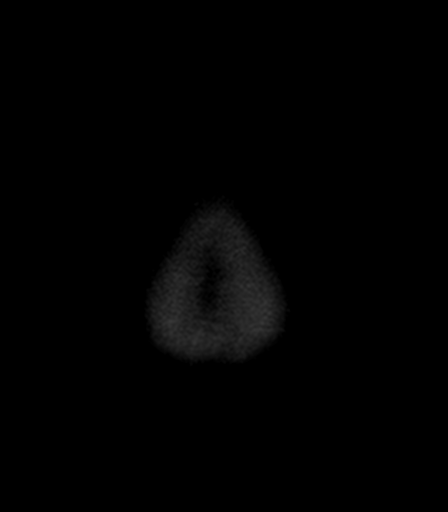

[Series 21: T1 · sagittal · 5.0mm · 0.47mm/px · 3 of 24 slices shown (1 of 2)]
[im 1/24]
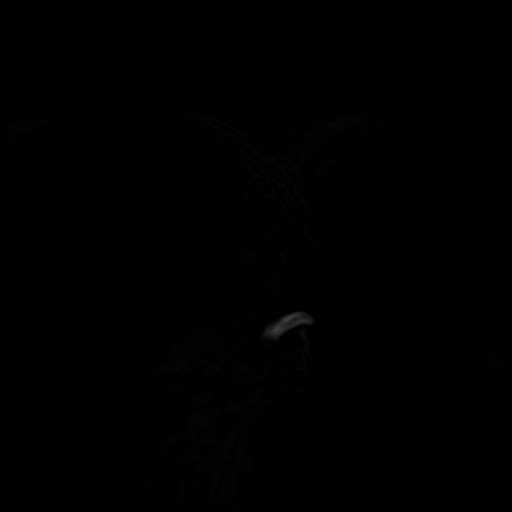
[im 12/24]
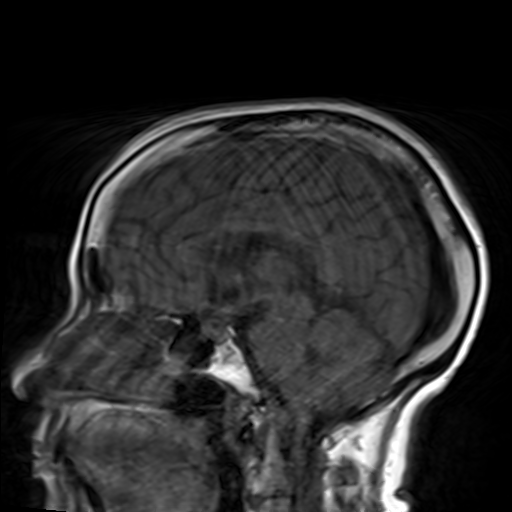
[im 24/24]
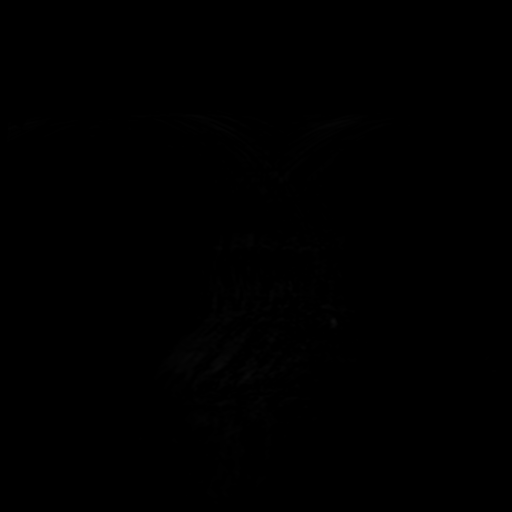

[Series 23: FLAIR · axial · 5.0mm · 1.20mm/px · z∈[-81,+67]mm · 3 of 26 slices shown]
[im 1/26]
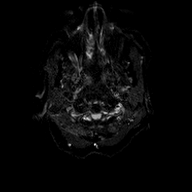
[im 13/26]
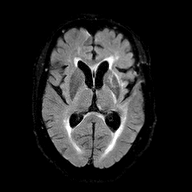
[im 26/26]
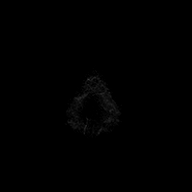

[Series 24: T1 · axial · 5.0mm · 0.90mm/px · z∈[-80,+68]mm · 3 of 26 slices shown (2 of 2)]
[im 1/26]
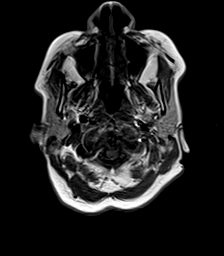
[im 13/26]
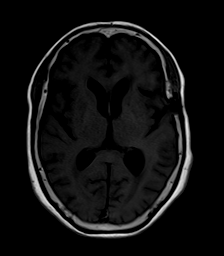
[im 26/26]
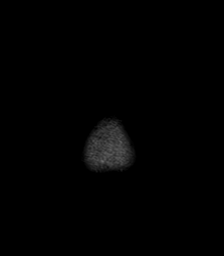

[Series 25: T2 · coronal · 5.0mm · 0.45mm/px · 4 of 31 slices shown (2 of 2)]
[im 1/31]
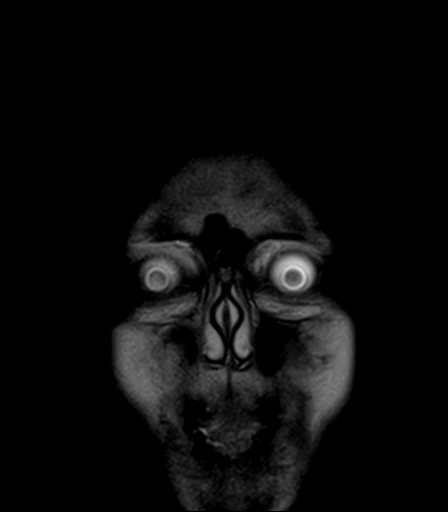
[im 11/31]
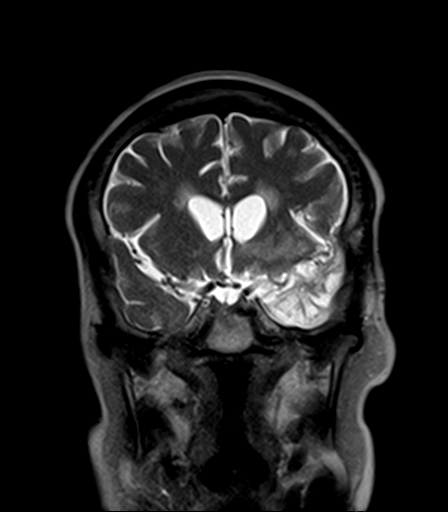
[im 21/31]
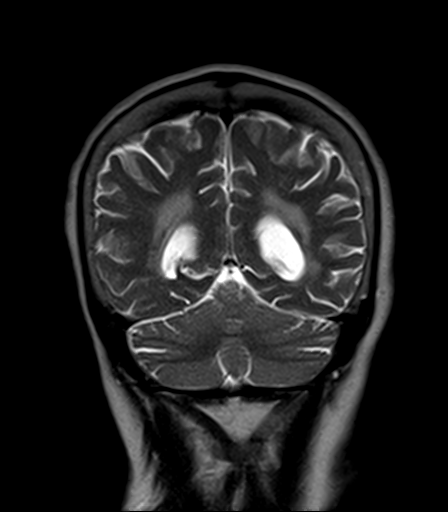
[im 31/31]
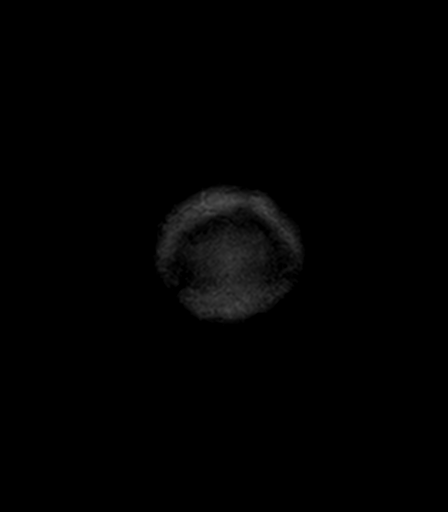

[Series 26: T1 post-contrast · axial · 5.0mm · 0.90mm/px · z∈[-80,+68]mm · 3 of 26 slices shown (1 of 3)]
[im 1/26]
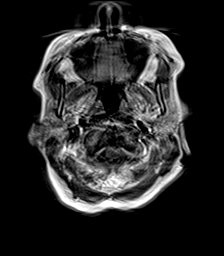
[im 13/26]
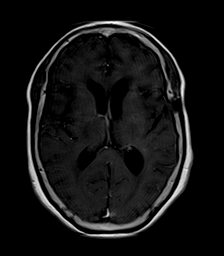
[im 26/26]
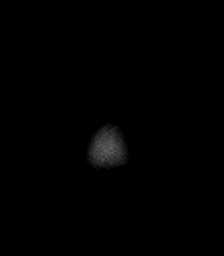

[Series 27: T1 post-contrast · coronal · 5.0mm · 0.90mm/px · 4 of 31 slices shown (2 of 3)]
[im 1/31]
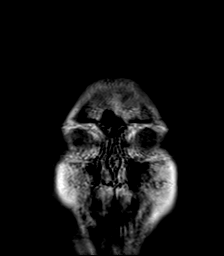
[im 11/31]
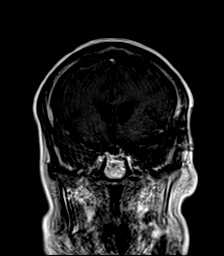
[im 21/31]
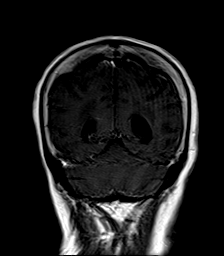
[im 31/31]
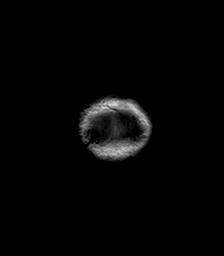

[Series 28: T1 post-contrast · sagittal · 5.0mm · 0.62mm/px · 3 of 22 slices shown (3 of 3)]
[im 1/22]
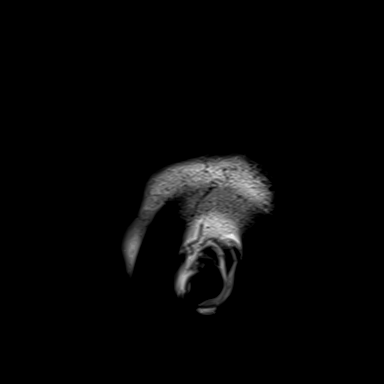
[im 11/22]
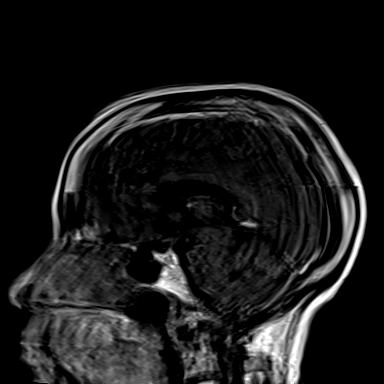
[im 22/22]
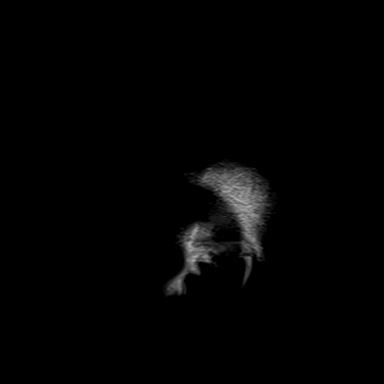

[38 of 48 positions shown; findings below may reference images not displayed]

FINDINGS: Image quality is mildly degraded by motion artifact

Brain: Again seen are postsurgical changes reflecting left pterional
craniotomy for mass resection. Encephalomalacia and gliosis centered
in the left temporal lobe is similar to the prior study. FLAIR
signal abnormality throughout the left temporal lobe is not
significantly changed. The previously described nodular focus of
enhancement in the left temporal lobe is not definitely seen on the
current study, though this may be due to motion artifact.

There is no new or progressive enhancement.

FLAIR signal abnormality throughout the remainder of the
supratentorial brain is not significant changed.

Mild ex vacuo dilatation of the left temporal horn is unchanged. The
remainder of the ventricles are unchanged in size and configuration.

There is no evidence of acute intracranial hemorrhage or infarct.
There is no midline shift.

Vascular: Normal flow voids.

Skull and upper cervical spine: Normal marrow signal.

Sinuses/Orbits: There is minimal mucosal thickening in the right
maxillary sinus. The globes and orbits are unremarkable.

Other: None.
IMPRESSION: 1. The previously described focus of nodular enhancement in the left
temporal lobe is not definitely seen on the current study, though
this may be due to motion artifact.
2. No new or progressive enhancement.

## 2021-10-10 MED ORDER — GADOBUTROL 1 MMOL/ML IV SOLN
6.0000 mL | Freq: Once | INTRAVENOUS | Status: AC | PRN
  Administered 2021-10-10: 6 mL via INTRAVENOUS

## 2021-10-11 ENCOUNTER — Other Ambulatory Visit (HOSPITAL_COMMUNITY): Payer: Self-pay

## 2021-10-11 ENCOUNTER — Other Ambulatory Visit: Payer: Self-pay | Admitting: Internal Medicine

## 2021-10-11 DIAGNOSIS — C8589 Other specified types of non-Hodgkin lymphoma, extranodal and solid organ sites: Secondary | ICD-10-CM

## 2021-10-12 ENCOUNTER — Other Ambulatory Visit (HOSPITAL_COMMUNITY): Payer: Self-pay

## 2021-10-12 ENCOUNTER — Encounter: Payer: Self-pay | Admitting: Internal Medicine

## 2021-10-12 NOTE — Telephone Encounter (Signed)
Patient will be seen by the MD after having labs and then MD will refill if appropriate.

## 2021-10-13 ENCOUNTER — Encounter: Payer: Self-pay | Admitting: Internal Medicine

## 2021-10-13 ENCOUNTER — Inpatient Hospital Stay: Payer: Medicare HMO

## 2021-10-13 ENCOUNTER — Other Ambulatory Visit (HOSPITAL_COMMUNITY): Payer: Self-pay

## 2021-10-13 ENCOUNTER — Inpatient Hospital Stay (HOSPITAL_BASED_OUTPATIENT_CLINIC_OR_DEPARTMENT_OTHER): Payer: Medicare HMO | Admitting: Internal Medicine

## 2021-10-13 ENCOUNTER — Other Ambulatory Visit: Payer: Self-pay

## 2021-10-13 VITALS — BP 125/92 | HR 100 | Temp 96.2°F | Wt 143.5 lb

## 2021-10-13 DIAGNOSIS — C8589 Other specified types of non-Hodgkin lymphoma, extranodal and solid organ sites: Secondary | ICD-10-CM | POA: Diagnosis not present

## 2021-10-13 DIAGNOSIS — Z5112 Encounter for antineoplastic immunotherapy: Secondary | ICD-10-CM | POA: Diagnosis not present

## 2021-10-13 LAB — COMPREHENSIVE METABOLIC PANEL
ALT: 56 U/L — ABNORMAL HIGH (ref 0–44)
AST: 60 U/L — ABNORMAL HIGH (ref 15–41)
Albumin: 4 g/dL (ref 3.5–5.0)
Alkaline Phosphatase: 59 U/L (ref 38–126)
Anion gap: 5 (ref 5–15)
BUN: 8 mg/dL (ref 8–23)
CO2: 28 mmol/L (ref 22–32)
Calcium: 9 mg/dL (ref 8.9–10.3)
Chloride: 101 mmol/L (ref 98–111)
Creatinine, Ser: 0.52 mg/dL (ref 0.44–1.00)
GFR, Estimated: >60 mL/min (ref >60)
Glucose, Bld: 94 mg/dL (ref 70–99)
Potassium: 4.2 mmol/L (ref 3.5–5.1)
Sodium: 134 mmol/L — ABNORMAL LOW (ref 135–145)
Total Bilirubin: 0.6 mg/dL (ref 0.3–1.2)
Total Protein: 7 g/dL (ref 6.5–8.1)

## 2021-10-13 LAB — CBC WITH DIFFERENTIAL/PLATELET
Abs Immature Granulocytes: 0.01 10*3/uL (ref 0.00–0.07)
Basophils Absolute: 0 10*3/uL (ref 0.0–0.1)
Basophils Relative: 0 %
Eosinophils Absolute: 0.1 10*3/uL (ref 0.0–0.5)
Eosinophils Relative: 1 %
HCT: 43.8 % (ref 36.0–46.0)
Hemoglobin: 14.7 g/dL (ref 12.0–15.0)
Immature Granulocytes: 0 %
Lymphocytes Relative: 31 %
Lymphs Abs: 1.7 10*3/uL (ref 0.7–4.0)
MCH: 29.3 pg (ref 26.0–34.0)
MCHC: 33.6 g/dL (ref 30.0–36.0)
MCV: 87.3 fL (ref 80.0–100.0)
Monocytes Absolute: 1 10*3/uL (ref 0.1–1.0)
Monocytes Relative: 19 %
Neutro Abs: 2.6 10*3/uL (ref 1.7–7.7)
Neutrophils Relative %: 49 %
Platelets: 285 10*3/uL (ref 150–400)
RBC: 5.02 MIL/uL (ref 3.87–5.11)
RDW: 12.4 % (ref 11.5–15.5)
WBC: 5.3 10*3/uL (ref 4.0–10.5)
nRBC: 0 % (ref 0.0–0.2)

## 2021-10-13 MED ORDER — TEMOZOLOMIDE 100 MG PO CAPS
100.0000 mg | ORAL_CAPSULE | Freq: Every day | ORAL | 0 refills | Status: DC
  Filled 2021-10-13 (×2): qty 5, 5d supply, fill #0

## 2021-10-13 MED ORDER — TEMOZOLOMIDE 140 MG PO CAPS
140.0000 mg | ORAL_CAPSULE | Freq: Every day | ORAL | 0 refills | Status: DC
  Filled 2021-10-13 (×2): qty 5, 5d supply, fill #0

## 2021-10-13 NOTE — Progress Notes (Signed)
[facility] at [place], [place] [address] [phone]   Interval Evaluation  Date of Service: 10/13/21 Patient Name: Audrey Barron Patient MRN: [id number] Patient DOB: July 30, 1951 Provider: Ventura Sellers, MD  Identifying Statement:  Marquette Piontek is a 70 y.o. female with  left hemispheric   Primary CNS Lymphoma    Oncologic History: Oncology History  Primary CNS lymphoma ([place])  04/28/2021 Surgery   Open biopsy with Dr. 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  Patient will transition to q2 week Rituximab in interim    07/21/2021 - 08/18/2021 Chemotherapy   Rituximab monotherapy   08/22/2021 -  Chemotherapy   Initiates first cycle of monthly 5-day Temozolomide, co-administered with Rituxmab q2 weeks.      Biomarkers:  CD20 positive .  Bcl-6 positive  Ki-67 95%      Interval History:  Audrey Barron presents today for follow up after recent MRI brain.  She denies new or progressive neurologic changes today.  Continues on fluid restriction and salt tabs.  She continues to walk on her own throughout the home.  For ADLs, she is independent with feeding and toileting but requires assistance for all other activities.  Denies seizures or headaches.  H+P (05/05/21) Patient presented to medical attention in April, with 2-3 months history of confusion, impaired speech, balance and gait impairment.  Neurologist ordered an MRI, which demonstrated infiltrative tumor within much of left hemisphere.  She underwent craniotomy and open biopsy on 04/28/21 with Dr. Izora Ribas at [facility], which yielded a B-cell CNS Lymphoma.  Following surgery, she has continued to experience  difficulty speaking, weakness on the right side, and poor balance.  She is unable to walk without assistance at home.  At present she is reliant on her daughter for help with dressing, cooking, tolieting; a majority of activities of daily living.  She has been dosing decadron 36m three times per day; this has not clearly led to any improvements in functional status.   Medications: Current Outpatient Medications on File Prior to Visit  Medication Sig Dispense Refill   acetaminophen (TYLENOL) 500 MG tablet Take 500 mg by mouth at bedtime.     amLODipine (NORVASC) 2.5 MG tablet Take 1 tablet (2.5 mg total) by mouth daily. 30 tablet 3   cyanocobalamin 1000 MCG tablet Take 1,000 mcg by mouth daily.     diphenhydrAMINE (BENADRYL) 50 MG capsule Take 1 capsule (50 mg total) by mouth at bedtime as needed for sleep. 30 capsule 0   ondansetron (ZOFRAN) 8 MG tablet Take 1 tablet (8 mg total) by mouth 2 (two) times daily as needed (nausea and vomiting). May take 30-60 minutes prior to Temodar administration if nausea/vomiting occurs. 30 tablet 1   pantoprazole (PROTONIX) 40 MG tablet TAKE ONE TABLET BY MOUTH AT BEDTIME. 30 tablet 11   sodium chloride 1 g tablet Take 1 tablet (1 g total) by mouth 2 (two) times daily with a meal. 60 tablet 3   temozolomide (TEMODAR) 100 MG capsule Take 1 capsule (100 mg total) by mouth daily. May take on an empty stomach to decrease nausea & vomiting. 5 capsule 0   temozolomide (TEMODAR) 140 MG capsule Take 1 capsule (140 mg total) by mouth daily. May take  on an empty stomach to decrease nausea & vomiting. 5 capsule 0   Current Facility-Administered Medications on File Prior to Visit  Medication Dose Route Frequency Provider Last Rate Last Admin   heparin lock flush 100 unit/mL  500 Units Intravenous Once Ventura Sellers, MD       sodium chloride flush (NS) 0.9 % injection 10 mL  10 mL Intravenous PRN Ventura Sellers, MD   10 mL at 09/29/21 0909    Allergies: No Known  Allergies Past Medical History:  Past Medical History:  Diagnosis Date   Cancer ([place])    lymphoma   Dementia ([place])    Past Surgical History:  Past Surgical History:  Procedure Laterality Date   APPLICATION OF CRANIAL NAVIGATION N/A 04/28/2021   Procedure: APPLICATION OF CRANIAL NAVIGATION;  Surgeon: Meade Maw, MD;  Location: [facility] ORS;  Service: Neurosurgery;  Laterality: N/A;   CRANIOTOMY Left 04/28/2021   Procedure: CRANIOTOMY TUMOR EXCISION;  Surgeon: Meade Maw, MD;  Location: [facility] ORS;  Service: Neurosurgery;  Laterality: Left;   IR IMAGING GUIDED PORT INSERTION  05/12/2021   Social History:  Social History   Socioeconomic History   Marital status: Single    Spouse name: Not on file   Number of children: Not on file   Years of education: Not on file   Highest education level: Not on file  Occupational History   Not on file  Tobacco Use   Smoking status: Never   Smokeless tobacco: Never  Substance and Sexual Activity   Alcohol use: Not Currently   Drug use: Not Currently   Sexual activity: Not on file  Other Topics Concern   Not on file  Social History Narrative   Not on file   Social Determinants of Health   Financial Resource Strain: Not on file  Food Insecurity: Not on file  Transportation Needs: Not on file  Physical Activity: Not on file  Stress: Not on file  Social Connections: Not on file  Intimate Partner Violence: Not on file   Family History: History reviewed. No pertinent family history.  Review of Systems: Constitutional: Doesn't report fevers, chills or abnormal weight loss Eyes: Doesn't report blurriness of vision Ears, nose, mouth, throat, and face: Doesn't report sore throat Respiratory: Doesn't report cough, dyspnea or wheezes Cardiovascular: Doesn't report palpitation, chest discomfort  Gastrointestinal: +constipation GU: Doesn't report incontinence Skin: Doesn't report skin rashes Neurological: Per HPI Musculoskeletal:  Doesn't report joint pain Behavioral/Psych: Doesn't report anxiety  Physical Exam: Vitals:   10/13/21 0910  BP: (!) 125/92  Pulse: 100  Temp: (!) 96.2 F (35.7 C)   KPS: 60. General: Alert, cooperative, pleasant, in no acute distress Head: Normal EENT: No conjunctival injection or scleral icterus.  Lungs: Resp effort normal Cardiac: Regular rate Abdomen: Non-distended abdomen Skin: No rashes cyanosis or petechiae. Extremities: No clubbing or edema  Neurologic Exam: Mental Status: Awake, alert, attentive to examiner. Oriented to self and environment. Language is mildly impaired with regards to both fluency and comprehension.  Further mental status evaluation limited by dysphasia. Cranial Nerves: Visual acuity is grossly normal. Visual fields are full. Extra-ocular movements intact. No ptosis. Face is symmetric Motor: Tone and bulk are normal. Power is 4+/5 in right arm and leg. Reflexes are symmetric, no pathologic reflexes present.  Sensory: Intact to light touch Gait: Dystaxic   Labs: I have reviewed the data as listed    Component Value Date/Time   NA 132 (L) 09/29/2021 0858   K 4.4  09/29/2021 0858   CL 99 09/29/2021 0858   CO2 26 09/29/2021 0858   GLUCOSE 100 (H) 09/29/2021 0858   BUN 8 09/29/2021 0858   CREATININE 0.59 09/29/2021 0858   CALCIUM 9.2 09/29/2021 0858   PROT 6.9 09/29/2021 0858   ALBUMIN 3.7 09/29/2021 0858   AST 80 (H) 09/29/2021 0858   ALT 79 (H) 09/29/2021 0858   ALKPHOS 59 09/29/2021 0858   BILITOT 0.9 09/29/2021 0858   GFRNONAA >60 09/29/2021 0858   Lab Results  Component Value Date   WBC 5.3 10/13/2021   NEUTROABS 2.6 10/13/2021   HGB 14.7 10/13/2021   HCT 43.8 10/13/2021   MCV 87.3 10/13/2021   PLT 285 10/13/2021    Imaging:  [place] Clinician Interpretation: I have personally reviewed the CNS images as listed.  My interpretation, in the context of the patient's clinical presentation, is stable disease  MR BRAIN W WO  CONTRAST  Result Date: 10/11/2021 CLINICAL DATA:  Follow-up lymphoma, history of craniotomy EXAM: MRI HEAD WITHOUT AND WITH CONTRAST TECHNIQUE: Multiplanar, multiecho pulse sequences of the brain and surrounding structures were obtained without and with intravenous contrast. CONTRAST:  2m GADAVIST GADOBUTROL 1 MMOL/ML IV SOLN COMPARISON:  Brain MRI 07/05/2021 FINDINGS: Image quality is mildly degraded by motion artifact Brain: Again seen are postsurgical changes reflecting left pterional craniotomy for mass resection. Encephalomalacia and gliosis centered in the left temporal lobe is similar to the prior study. FLAIR signal abnormality throughout the left temporal lobe is not significantly changed. The previously described nodular focus of enhancement in the left temporal lobe is not definitely seen on the current study, though this may be due to motion artifact. There is no new or progressive enhancement. FLAIR signal abnormality throughout the remainder of the supratentorial brain is not significant changed. Mild ex vacuo dilatation of the left temporal horn is unchanged. The remainder of the ventricles are unchanged in size and configuration. There is no evidence of acute intracranial hemorrhage or infarct. There is no midline shift. Vascular: Normal flow voids. Skull and upper cervical spine: Normal marrow signal. Sinuses/Orbits: There is minimal mucosal thickening in the right maxillary sinus. The globes and orbits are unremarkable. Other: None. IMPRESSION: 1. The previously described focus of nodular enhancement in the left temporal lobe is not definitely seen on the current study, though this may be due to motion artifact. 2. No new or progressive enhancement. Electronically Signed   By: PValetta MoleM.D.   On: 10/11/2021 11:05    Assessment/Plan Primary CNS lymphoma ([facility] [C85.89]  BBinnie Droessleris clinically stable today, now having completed cycle #2 of Temozolomide.  MRI demonstrates stable  findings.  Because of ongoing stability, we will transition to Temozolomide monotherapy for consolidation; goal for 6 total cycles.  We recommended continuing treatment with cycle #3 Temozolomide 1549mm2, now day 7/28, on for five days and off for twenty three days in twenty eight day cycles. The patient will have a complete blood count performed on days 21 and 28 of each cycle, and a comprehensive metabolic panel performed on day 28 of each cycle. Labs may need to be performed more often. Zofran will prescribed for home use for nausea/vomiting.   Methotrexate and Rituximab will remain off as long as she is stable clinically/radiographically.  Chemotherapy should be held for the following:  ANC less than 1,000  Platelets less than 100,000  LFT or creatinine greater than 2x ULN  If clinical concerns/contraindications develop  We ask that BaJunita Pusheturn to  clinic in 5 weeks with labs for evaluation prior to cycle #4.  Next MRI brain in 2 months.  All questions were answered. The patient knows to call the clinic with any problems, questions or concerns. No barriers to learning were detected.  The total time spent in the encounter was 30 minutes and more than 50% was on counseling and review of test results   Ventura Sellers, MD Medical Director of Neuro-Oncology [facility] at [place] 10/13/21 9:13 AM

## 2021-10-16 ENCOUNTER — Other Ambulatory Visit: Payer: Self-pay | Admitting: Internal Medicine

## 2021-10-16 ENCOUNTER — Inpatient Hospital Stay: Payer: Medicare HMO | Attending: Internal Medicine

## 2021-10-24 ENCOUNTER — Telehealth: Payer: Self-pay | Admitting: *Deleted

## 2021-10-24 ENCOUNTER — Other Ambulatory Visit (HOSPITAL_COMMUNITY): Payer: Self-pay

## 2021-10-24 NOTE — Telephone Encounter (Signed)
Received request for medical records from [facility] Department from Rudean Hitt, CAP-DA Case Manager. Faxed records to [phone].

## 2021-11-06 ENCOUNTER — Other Ambulatory Visit (HOSPITAL_COMMUNITY): Payer: Self-pay

## 2021-11-21 ENCOUNTER — Other Ambulatory Visit (HOSPITAL_COMMUNITY): Payer: Self-pay

## 2021-11-24 ENCOUNTER — Inpatient Hospital Stay: Payer: Medicare HMO | Attending: Internal Medicine

## 2021-11-24 ENCOUNTER — Other Ambulatory Visit (HOSPITAL_COMMUNITY): Payer: Self-pay

## 2021-11-24 ENCOUNTER — Other Ambulatory Visit: Payer: Self-pay

## 2021-11-24 ENCOUNTER — Inpatient Hospital Stay (HOSPITAL_BASED_OUTPATIENT_CLINIC_OR_DEPARTMENT_OTHER): Payer: Medicare HMO | Admitting: Internal Medicine

## 2021-11-24 VITALS — BP 132/100 | HR 100 | Temp 97.1°F | Resp 18 | Wt 142.0 lb

## 2021-11-24 DIAGNOSIS — R413 Other amnesia: Secondary | ICD-10-CM

## 2021-11-24 DIAGNOSIS — C8510 Unspecified B-cell lymphoma, unspecified site: Secondary | ICD-10-CM | POA: Insufficient documentation

## 2021-11-24 DIAGNOSIS — C8589 Other specified types of non-Hodgkin lymphoma, extranodal and solid organ sites: Secondary | ICD-10-CM | POA: Diagnosis not present

## 2021-11-24 LAB — CBC WITH DIFFERENTIAL/PLATELET
Abs Immature Granulocytes: 0.03 10*3/uL (ref 0.00–0.07)
Basophils Absolute: 0 10*3/uL (ref 0.0–0.1)
Basophils Relative: 0 %
Eosinophils Absolute: 0 10*3/uL (ref 0.0–0.5)
Eosinophils Relative: 1 %
HCT: 45.6 % (ref 36.0–46.0)
Hemoglobin: 15.1 g/dL — ABNORMAL HIGH (ref 12.0–15.0)
Immature Granulocytes: 1 %
Lymphocytes Relative: 43 %
Lymphs Abs: 2.3 10*3/uL (ref 0.7–4.0)
MCH: 28 pg (ref 26.0–34.0)
MCHC: 33.1 g/dL (ref 30.0–36.0)
MCV: 84.6 fL (ref 80.0–100.0)
Monocytes Absolute: 0.6 10*3/uL (ref 0.1–1.0)
Monocytes Relative: 12 %
Neutro Abs: 2.3 10*3/uL (ref 1.7–7.7)
Neutrophils Relative %: 43 %
Platelets: 202 10*3/uL (ref 150–400)
RBC: 5.39 MIL/uL — ABNORMAL HIGH (ref 3.87–5.11)
RDW: 12.8 % (ref 11.5–15.5)
WBC: 5.3 10*3/uL (ref 4.0–10.5)
nRBC: 0 % (ref 0.0–0.2)

## 2021-11-24 LAB — COMPREHENSIVE METABOLIC PANEL
ALT: 29 U/L (ref 0–44)
AST: 34 U/L (ref 15–41)
Albumin: 4.2 g/dL (ref 3.5–5.0)
Alkaline Phosphatase: 55 U/L (ref 38–126)
Anion gap: 10 (ref 5–15)
BUN: 7 mg/dL — ABNORMAL LOW (ref 8–23)
CO2: 24 mmol/L (ref 22–32)
Calcium: 9.1 mg/dL (ref 8.9–10.3)
Chloride: 101 mmol/L (ref 98–111)
Creatinine, Ser: 0.55 mg/dL (ref 0.44–1.00)
GFR, Estimated: >60 mL/min (ref >60)
Glucose, Bld: 99 mg/dL (ref 70–99)
Potassium: 3.7 mmol/L (ref 3.5–5.1)
Sodium: 135 mmol/L (ref 135–145)
Total Bilirubin: 0.5 mg/dL (ref 0.3–1.2)
Total Protein: 7.2 g/dL (ref 6.5–8.1)

## 2021-11-24 MED ORDER — TEMOZOLOMIDE 140 MG PO CAPS
140.0000 mg | ORAL_CAPSULE | Freq: Every day | ORAL | 0 refills | Status: AC
  Filled 2021-11-24: qty 5, 28d supply, fill #0
  Filled 2021-11-24: qty 5, 5d supply, fill #0

## 2021-11-24 MED ORDER — TEMOZOLOMIDE 100 MG PO CAPS
100.0000 mg | ORAL_CAPSULE | Freq: Every day | ORAL | 0 refills | Status: AC
  Filled 2021-11-24: qty 5, 5d supply, fill #0
  Filled 2021-11-24: qty 5, 28d supply, fill #0

## 2021-11-24 NOTE — Progress Notes (Signed)
[facility] at [place], [place] [address] [phone]   Interval Evaluation  Date of Service: 11/24/21 Patient Name: Audrey Barron Patient MRN: [id number] Patient DOB: [birth year] Provider: Ventura Sellers, MD  Identifying Statement:  Audrey Barron is a 70 y.o. female with  left hemispheric   Primary CNS Lymphoma    Oncologic History: Oncology History  Primary CNS lymphoma ([place])  04/28/2021 Surgery   Open biopsy with Dr. 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  Patient will transition to q2 week Rituximab in interim    07/21/2021 - 08/18/2021 Chemotherapy   Rituximab monotherapy   08/22/2021 -  Chemotherapy   Initiates first cycle of monthly 5-day Temozolomide, co-administered with Rituxmab q2 weeks.      Biomarkers:  CD20 positive .  Bcl-6 positive  Ki-67 95%      Interval History:  Audrey Barron presents today for follow up, now having completed cycle #3 of 5-day Temodar.  She denies new or progressive neurologic changes today.  Continues on fluid restriction and salt tab, recently visited with nephrology.  She continues to walk on her own throughout the home.  For ADLs, she is independent with feeding and toileting but requires assistance for all other activities.  Denies seizures or headaches.  H+P (05/05/21) Patient presented to medical attention in April, with 2-3 months history of confusion, impaired speech, balance and gait impairment.  Neurologist ordered an MRI, which demonstrated infiltrative tumor within much of left hemisphere.  She underwent craniotomy and open biopsy on 04/28/21 with Dr. Izora Ribas at [facility], which yielded a B-cell CNS  Lymphoma.  Following surgery, she has continued to experience difficulty speaking, weakness on the right side, and poor balance.  She is unable to walk without assistance at home.  At present she is reliant on her daughter for help with dressing, cooking, tolieting; a majority of activities of daily living.  She has been dosing decadron 43m three times per day; this has not clearly led to any improvements in functional status.   Medications: Current Outpatient Medications on File Prior to Visit  Medication Sig Dispense Refill   acetaminophen (TYLENOL) 500 MG tablet Take 500 mg by mouth at bedtime.     amLODipine (NORVASC) 2.5 MG tablet TAKE 1 TABLET BY MOUTH ONCE DAILY. 30 tablet 0   cyanocobalamin 1000 MCG tablet Take 1,000 mcg by mouth daily.     diphenhydrAMINE (BENADRYL) 50 MG capsule Take 1 capsule (50 mg total) by mouth at bedtime as needed for sleep. 30 capsule 0   ondansetron (ZOFRAN) 8 MG tablet Take 1 tablet (8 mg total) by mouth 2 (two) times daily as needed (nausea and vomiting). May take 30-60 minutes prior to Temodar administration if nausea/vomiting occurs. 30 tablet 1   pantoprazole (PROTONIX) 40 MG tablet TAKE ONE TABLET BY MOUTH AT BEDTIME. 30 tablet 11   sodium chloride 1 g tablet Take 1 tablet (1 g total) by mouth 2 (two) times daily with a meal. 60 tablet 3   temozolomide (TEMODAR) 100 MG capsule Take 1 capsule (100 mg total) by mouth daily. May take on an empty stomach to decrease nausea & vomiting. 5 capsule 0   temozolomide (TEMODAR) 140 MG capsule Take 1 capsule (140 mg  total) by mouth daily. May take on an empty stomach to decrease nausea & vomiting. 5 capsule 0   Current Facility-Administered Medications on File Prior to Visit  Medication Dose Route Frequency Provider Last Rate Last Admin   heparin lock flush 100 unit/mL  500 Units Intravenous Once Ventura Sellers, MD       sodium chloride flush (NS) 0.9 % injection 10 mL  10 mL Intravenous PRN Ventura Sellers, MD    10 mL at 09/29/21 0909    Allergies: No Known Allergies Past Medical History:  Past Medical History:  Diagnosis Date   Cancer ([place])    lymphoma   Dementia ([place])    Past Surgical History:  Past Surgical History:  Procedure Laterality Date   APPLICATION OF CRANIAL NAVIGATION N/A 04/28/2021   Procedure: APPLICATION OF CRANIAL NAVIGATION;  Surgeon: Meade Maw, MD;  Location: [facility] ORS;  Service: Neurosurgery;  Laterality: N/A;   CRANIOTOMY Left 04/28/2021   Procedure: CRANIOTOMY TUMOR EXCISION;  Surgeon: Meade Maw, MD;  Location: [facility] ORS;  Service: Neurosurgery;  Laterality: Left;   IR IMAGING GUIDED PORT INSERTION  05/12/2021   Social History:  Social History   Socioeconomic History   Marital status: Single    Spouse name: Not on file   Number of children: Not on file   Years of education: Not on file   Highest education level: Not on file  Occupational History   Not on file  Tobacco Use   Smoking status: Never   Smokeless tobacco: Never  Substance and Sexual Activity   Alcohol use: Not Currently   Drug use: Not Currently   Sexual activity: Not on file  Other Topics Concern   Not on file  Social History Narrative   Not on file   Social Determinants of Health   Financial Resource Strain: Not on file  Food Insecurity: Not on file  Transportation Needs: Not on file  Physical Activity: Not on file  Stress: Not on file  Social Connections: Not on file  Intimate Partner Violence: Not on file   Family History: No family history on file.  Review of Systems: Constitutional: Doesn't report fevers, chills or abnormal weight loss Eyes: Doesn't report blurriness of vision Ears, nose, mouth, throat, and face: Doesn't report sore throat Respiratory: Doesn't report cough, dyspnea or wheezes Cardiovascular: Doesn't report palpitation, chest discomfort  Gastrointestinal: +constipation GU: Doesn't report incontinence Skin: Doesn't report skin rashes Neurological:  Per HPI Musculoskeletal: Doesn't report joint pain Behavioral/Psych: Doesn't report anxiety  Physical Exam: Vitals:   11/24/21 1108  BP: (!) 132/100  Pulse: 100  Resp: 18  Temp: (!) 97.1 F (36.2 C)  SpO2: 98%    KPS: 60. General: Alert, cooperative, pleasant, in no acute distress Head: Normal EENT: No conjunctival injection or scleral icterus.  Lungs: Resp effort normal Cardiac: Regular rate Abdomen: Non-distended abdomen Skin: No rashes cyanosis or petechiae. Extremities: No clubbing or edema  Neurologic Exam: Mental Status: Awake, alert, attentive to examiner. Oriented to self and environment. Language is mildly impaired with regards to both fluency and comprehension.  Further mental status evaluation limited by dysphasia. Cranial Nerves: Visual acuity is grossly normal. Visual fields are full. Extra-ocular movements intact. No ptosis. Face is symmetric Motor: Tone and bulk are normal. Power is 4+/5 in right arm and leg. Reflexes are symmetric, no pathologic reflexes present.  Sensory: Intact to light touch Gait: Dystaxic   Labs: I have reviewed the data as listed    Component Value  Date/Time   NA 134 (L) 10/13/2021 0856   K 4.2 10/13/2021 0856   CL 101 10/13/2021 0856   CO2 28 10/13/2021 0856   GLUCOSE 94 10/13/2021 0856   BUN 8 10/13/2021 0856   CREATININE 0.52 10/13/2021 0856   CALCIUM 9.0 10/13/2021 0856   PROT 7.0 10/13/2021 0856   ALBUMIN 4.0 10/13/2021 0856   AST 60 (H) 10/13/2021 0856   ALT 56 (H) 10/13/2021 0856   ALKPHOS 59 10/13/2021 0856   BILITOT 0.6 10/13/2021 0856   GFRNONAA >60 10/13/2021 0856   Lab Results  Component Value Date   WBC 5.3 11/24/2021   NEUTROABS 2.3 11/24/2021   HGB 15.1 (H) 11/24/2021   HCT 45.6 11/24/2021   MCV 84.6 11/24/2021   PLT 202 11/24/2021    Assessment/Plan Primary CNS lymphoma ([place]) [C85.89]  Jamiyla Ishee is clinically stable today, now having completed cycle #3 of Temozolomide.  No new or  progressive changes today.  We recommended continuing treatment with cycle #4 Temozolomide 164m/m2, now day 7/28, on for five days and off for twenty three days in twenty eight day cycles. The patient will have a complete blood count performed on days 21 and 28 of each cycle, and a comprehensive metabolic panel performed on day 28 of each cycle. Labs may need to be performed more often. Zofran will prescribed for home use for nausea/vomiting.   Methotrexate and Rituximab will remain off as long as she is stable clinically/radiographically.  Chemotherapy should be held for the following:  ANC less than 1,000  Platelets less than 100,000  LFT or creatinine greater than 2x ULN  If clinical concerns/contraindications develop  We ask that BTonnya Garbettreturn to clinic in 1 month with MRI brain for evaluation prior to cycle #5.   All questions were answered. The patient knows to call the clinic with any problems, questions or concerns. No barriers to learning were detected.  The total time spent in the encounter was 30 minutes and more than 50% was on counseling and review of test results   ZVentura Sellers MD Medical Director of Neuro-Oncology [facility] [place]12/02/22 11:05 AM

## 2021-11-28 ENCOUNTER — Other Ambulatory Visit: Payer: Self-pay | Admitting: Radiation Therapy

## 2021-11-29 ENCOUNTER — Telehealth: Payer: Self-pay | Admitting: *Deleted

## 2021-11-29 NOTE — Telephone Encounter (Signed)
FMLA forms for patient's daughter, Audrey Barron completed. Dr. Mickeal Skinner signed the forms. I called daughter to verify that she needed intermittent leave . Daughter stated that she needed time off through next year to cover any medical apts that pt may have. Daughter would like the copy of the FMLA forms to be mailed to her address. Verified mailing address as the patient's personal address. (Patient lives with daughter).  Copy of fmla made for patient's file. Copy mailed to pt's daughter.

## 2021-12-04 ENCOUNTER — Other Ambulatory Visit: Payer: Medicare HMO | Admitting: Primary Care

## 2021-12-18 ENCOUNTER — Other Ambulatory Visit: Payer: Self-pay | Admitting: Internal Medicine

## 2021-12-19 ENCOUNTER — Encounter: Payer: Self-pay | Admitting: Internal Medicine

## 2021-12-20 ENCOUNTER — Other Ambulatory Visit: Payer: Self-pay

## 2021-12-20 ENCOUNTER — Other Ambulatory Visit: Payer: Medicare HMO | Admitting: Primary Care

## 2021-12-20 DIAGNOSIS — R413 Other amnesia: Secondary | ICD-10-CM

## 2021-12-20 DIAGNOSIS — Z515 Encounter for palliative care: Secondary | ICD-10-CM

## 2021-12-20 DIAGNOSIS — C8589 Other specified types of non-Hodgkin lymphoma, extranodal and solid organ sites: Secondary | ICD-10-CM

## 2021-12-20 DIAGNOSIS — C8339 Primary central nervous system lymphoma: Secondary | ICD-10-CM

## 2021-12-20 NOTE — Progress Notes (Signed)
[facility] Palliative Care Consult Note Telephone: [phone]  Fax: [phone]    Date of encounter: 12/20/21 4:00 pm PATIENT NAME: Audrey Barron [address] [place] [address]   [phone] (home)  DOB: 11-01-1951 MRN: [id number] PRIMARY CARE PROVIDER:    Marguerita Merles, MD,  [place] [address] [phone]  REFERRING PROVIDER:   Marguerita Barron, [place],  [place] [address] [phone]  RESPONSIBLE PARTY:    Contact Information     Name Relation Home Work Mobile   Audrey Barron [place] Daughter [phone]  [phone]   Audrey Barron, Audrey Barron [phone]  [phone]   Audrey Barron, Audrey Barron Sister [phone]  [phone]        I met face to face with patient and family in  home. Palliative Care was asked to follow this patient by consultation request of  Audrey Merles, MD to address advance care planning and complex medical decision making. This is a follow up visit.                                   ASSESSMENT AND PLAN / RECOMMENDATIONS:   Advance Care Planning/Goals of Care: Goals include to maximize quality of life and symptom management. Our advance care planning conversation included a discussion about:     Exploration of personal, cultural or spiritual beliefs that might influence medical decisions  Exploration of goals of care in the event of a sudden injury or illness  CODE STATUS: FULL CODE  Symptom Management/Plan:   I met with patient in her home; her daughter was present. Patient states she's enjoying the holidays and has had a good visit with family. She endorses eating well and is in fact having a snack of Pepsi, grapes and potato chips. She is planning  for a follow up of her cancer treatment via a scan tomorrow and then meeting with oncology for results after the new year. She is endorsing feeling well, sleeping well ,no pain and good appetite.    Mobility: remains good, denies falls. Does not use devices to ambulate.  HTN: When I assessed her blood pressure it was elevated at 140 / 106  and 140/102.  Her heart rate was 8100. We discussed her intake of sodium, which may be high as her daughter is not there during the day and patient is cared for by her her husband.  She's currently on amlodipine 2.5 mg daily. I asked her daughter to monitor her blood pressure's over the next several weeks and to talk with PCP about increasing or changing her medication if dietary compliance does not lower her blood pressure. We discussed high sodium foods and better choices for decreasing her  hypertension. Recent serum sodium just below normal.   ACP: Her advance directives remain  full scope as she is being treated now for lymphoma and wishes to be treated for her disease process. She endorses good quality of life and enjoyment with her family and friends. Her caregiving situation appears to be stable with family watching her during the day and her staying with her daughter at night.   Follow up Palliative Care Visit: Palliative care will continue to follow for complex medical decision making, advance care planning, and clarification of goals. Return 12 weeks or prn.  I spent 40 minutes providing this consultation. More than 50% of the time in this consultation was spent in counseling  and care coordination.  PPS: 40%  HOSPICE ELIGIBILITY/DIAGNOSIS: TBD  Chief Complaint: dementia, hypertension  HISTORY OF PRESENT ILLNESS:  Audrey Barron is a 70 y.o. year old female  with HTN, dementia, lymphoma.   History obtained from review of EMR, discussion with primary team, and interview with family, facility staff/caregiver and/or Audrey Barron.  I reviewed available labs, medications, imaging, studies and related documents from the EMR.  Records reviewed and summarized above.   ROS  General: NAD EYES: denies vision changes, uses glasses ENMT: denies  dysphagia Cardiovascular: denies chest pain, denies DOE Pulmonary: denies cough, denies increased SOB Abdomen: endorses good appetite, denies constipation, endorses continence of bowel GU: denies dysuria, endorses continence of urine MSK:  denies increased weakness,  no falls reported Skin: denies rashes or wounds Neurological: denies pain, denies insomnia Psych: Endorses positive mood Heme/lymph/immuno: denies bruises, abnormal bleeding  Physical Exam: Current and past weights:stable  Constitutional: 140/102 HR 100 RR 18 General: frail appearing, tWNWD EYES: anicteric sclera, lids intact, no discharge  ENMT: intact hearing, oral mucous membranes moist, dentition intact CV: S1S2, RRR, no LE edema Pulmonary: LCTA, no increased work of breathing, no cough, room air Abdomen: intake 100%, normo-active BS + 4 quadrants, soft and non tender, no ascites GU: deferred MSK: + sarcopenia, moves all extremities, ambulatory Skin: warm and dry, no rashes or wounds on visible skin Neuro:  + generalized weakness,  + cognitive impairment Psych: non-anxious affect, A and O x 2 Hem/lymph/immuno: no widespread bruising   Thank you for the opportunity to participate in the care of Audrey Barron.  The palliative care team will continue to follow. Please call our office at [phone] if we can be of additional assistance.   Audrey Coop, NP DNP, AGPCNP-BC  COVID-19 PATIENT SCREENING TOOL Asked and negative response unless otherwise noted:   Have you had symptoms of covid, tested positive or been in contact with someone with symptoms/positive test in the past 5-10 days?

## 2021-12-21 ENCOUNTER — Ambulatory Visit
Admission: RE | Admit: 2021-12-21 | Discharge: 2021-12-21 | Disposition: A | Discharge status: Home / Self Care | Payer: Medicare HMO | Source: Ambulatory Visit | Attending: Internal Medicine | Admitting: Internal Medicine

## 2021-12-21 ENCOUNTER — Other Ambulatory Visit: Payer: Self-pay

## 2021-12-21 DIAGNOSIS — C8589 Other specified types of non-Hodgkin lymphoma, extranodal and solid organ sites: Secondary | ICD-10-CM | POA: Diagnosis present

## 2021-12-21 IMAGING — MR MR HEAD WO/W CM
15 series · 48 of 48 positions shown · IV contrast (gadavist)
Comparison: [DATE]

CLINICAL DATA: B-cell lymphoma.

Brain/CNS neoplasm, assess treatment response.
EXAM:
MRI HEAD WITHOUT AND WITH CONTRAST
TECHNIQUE: Multiplanar, multiecho pulse sequences of the brain and surrounding
structures were obtained without and with intravenous contrast.
CONTRAST:  6mL GADAVIST GADOBUTROL 1 MMOL/ML IV SOLN

[Series 5: ax dwi_tracew · axial · 3.0mm · 0.65mm/px · z∈[-113,+34]mm · 4 of 46 slices shown]
[im 1/46]
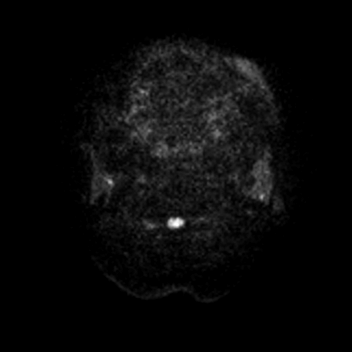
[im 16/46]
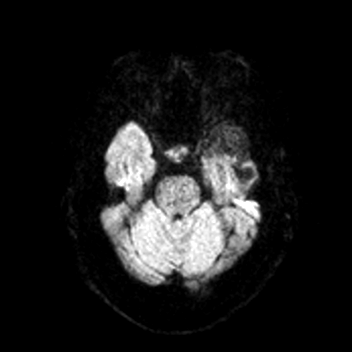
[im 31/46]
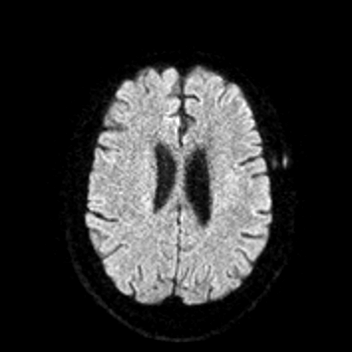
[im 46/46]
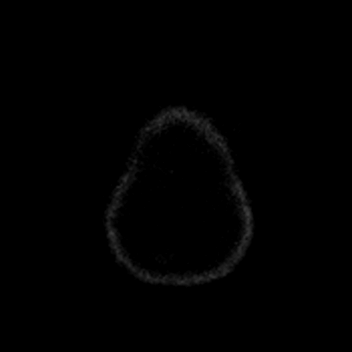

[Series 6: ax dwi_adc · axial · 3.0mm · 0.65mm/px · z∈[-113,+34]mm · 3 of 46 slices shown]
[im 1/46]
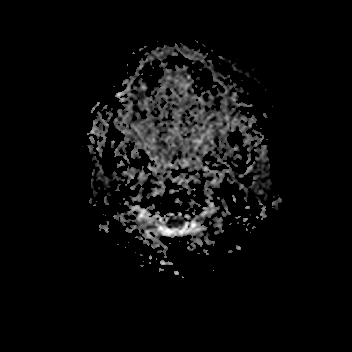
[im 23/46]
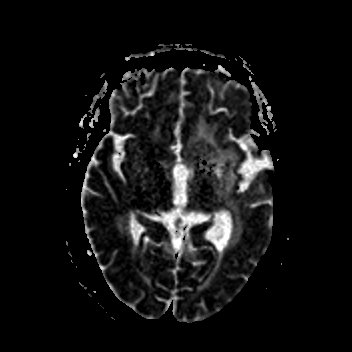
[im 46/46]
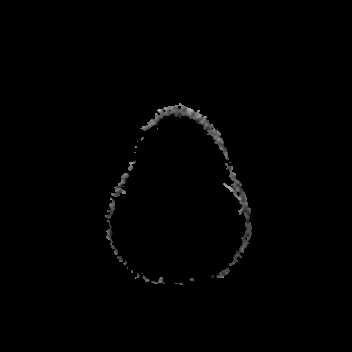

[Series 7: cor dwi_tracew · coronal · 5.0mm · 0.65mm/px · 2 of 36 slices shown]
[im 1/36]
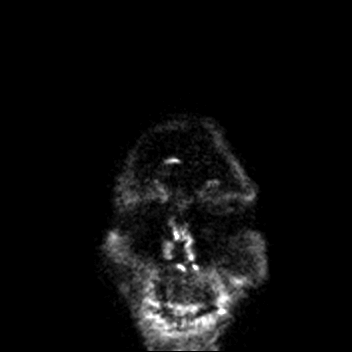
[im 36/36]
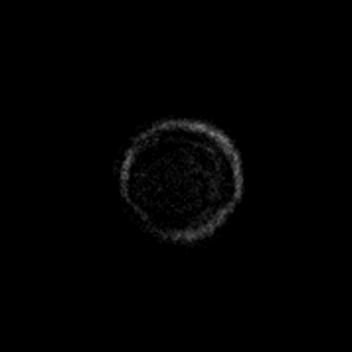

[Series 8: cor dwi_adc · coronal · 5.0mm · 0.65mm/px · 2 of 36 slices shown]
[im 1/36]
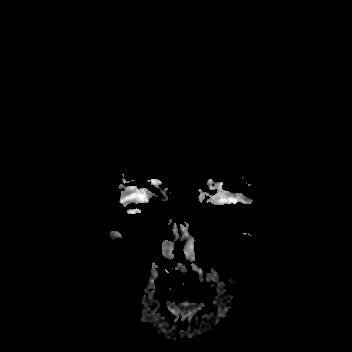
[im 36/36]
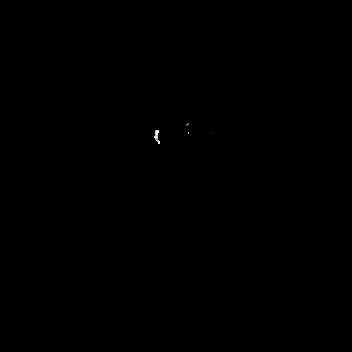

[Series 9: T1 · sagittal · 5.0mm · 0.62mm/px · 1 of 22 slices shown (1 of 2)]
[im 1/22]
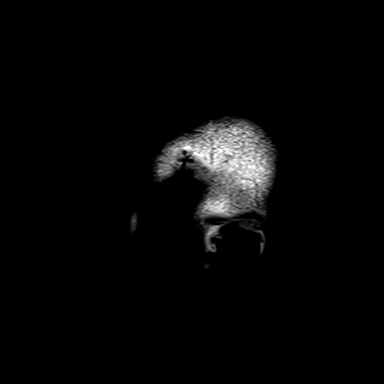

[Series 10: T2 · axial · 5.0mm · 0.53mm/px · 1 of 25 slices shown]
[im 1/25]
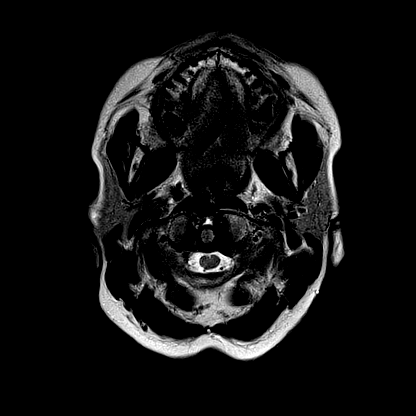

[Series 11: mag_images · axial · 3.0mm · 0.90mm/px · z∈[-128,+47]mm · 3 of 60 slices shown]
[im 1/60]
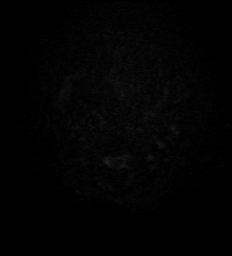
[im 30/60]
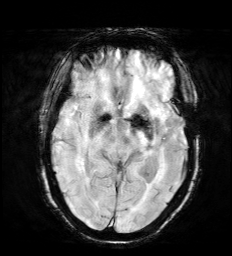
[im 60/60]
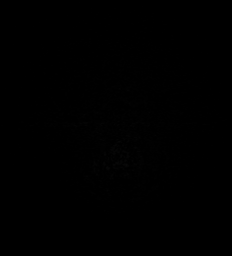

[Series 12: pha_images · axial · 3.0mm · 0.90mm/px · z∈[-128,+47]mm · 3 of 59 slices shown]
[im 1/59]
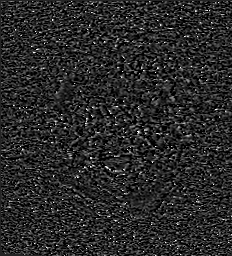
[im 30/59]
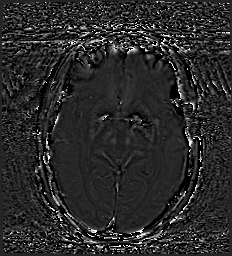
[im 59/59]
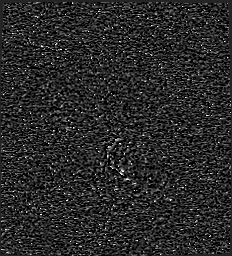

[Series 13: swi_images · axial · 3.0mm · 0.90mm/px · z∈[-128,+47]mm · 3 of 60 slices shown]
[im 1/60]
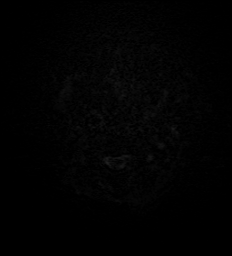
[im 30/60]
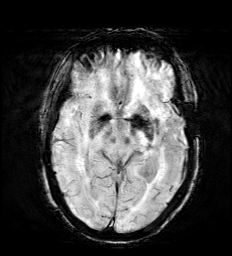
[im 60/60]
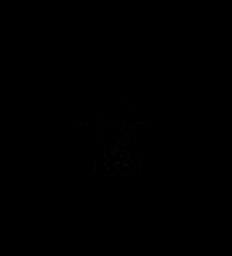

[Series 15: FLAIR · axial · 3.0mm · 0.53mm/px · z∈[-119,+41]mm · 3 of 55 slices shown]
[im 1/55]
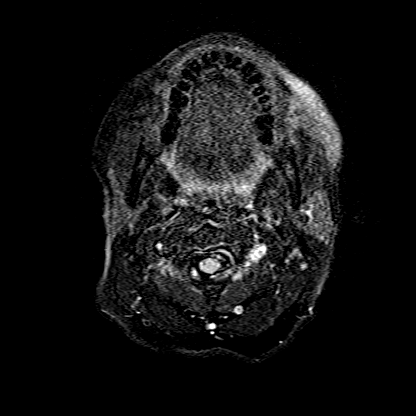
[im 28/55]
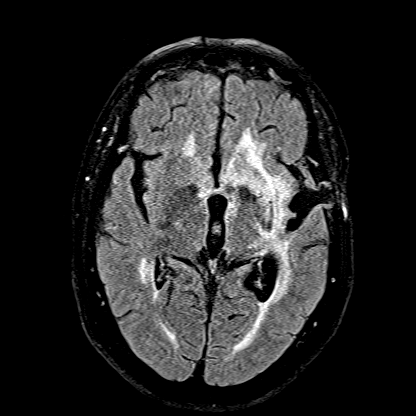
[im 55/55]
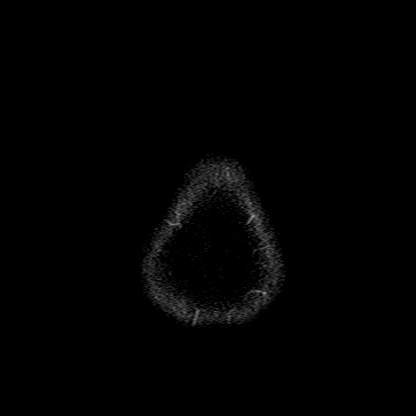

[Series 16: T1 · axial · 1.0mm · 0.98mm/px · z∈[-119,+37]mm · 9 of 160 slices shown (2 of 2)]
[im 1/160]
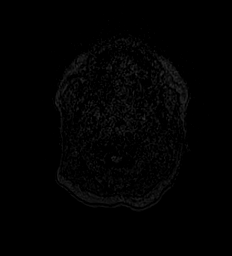
[im 20/160]
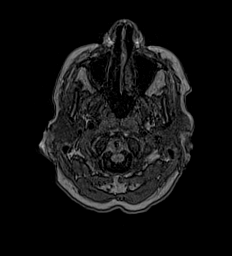
[im 40/160]
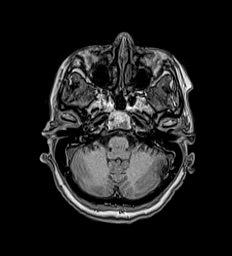
[im 60/160]
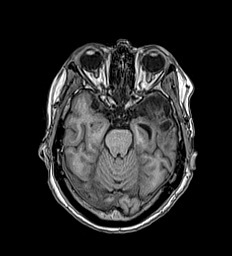
[im 80/160]
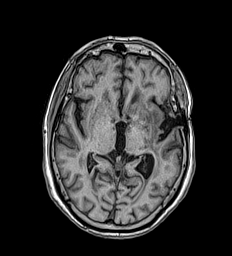
[im 100/160]
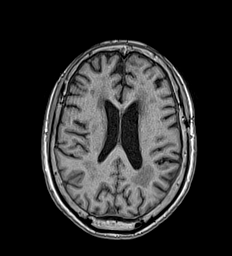
[im 120/160]
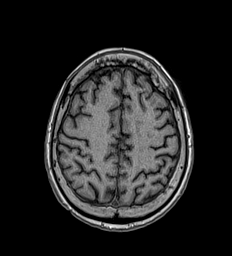
[im 140/160]
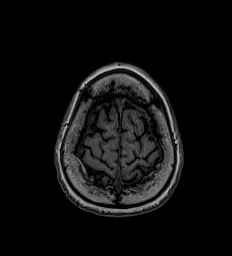
[im 160/160]
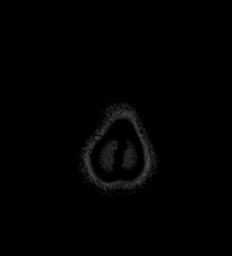

[Series 17: T2 post-contrast · coronal · 5.0mm · 0.57mm/px · 2 of 28 slices shown]
[im 1/28]
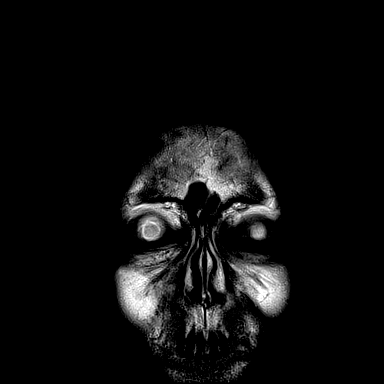
[im 28/28]
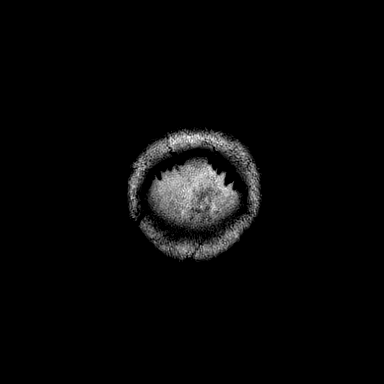

[Series 18: T1 post-contrast · axial · 1.0mm · 0.98mm/px · z∈[-119,+37]mm · 9 of 160 slices shown (1 of 3)]
[im 1/160]
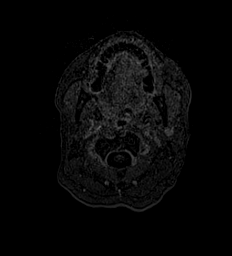
[im 20/160]
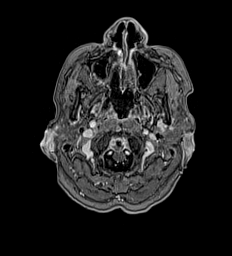
[im 40/160]
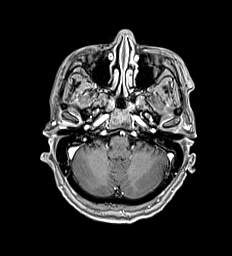
[im 60/160]
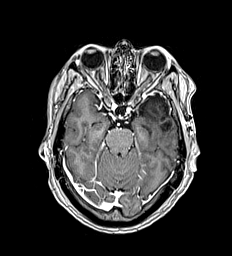
[im 80/160]
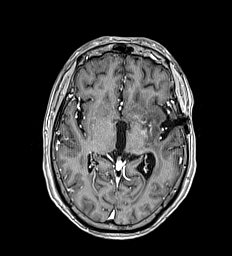
[im 100/160]
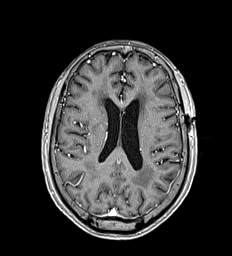
[im 120/160]
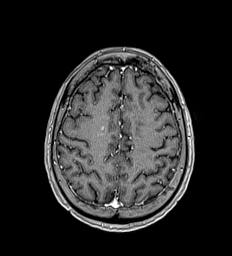
[im 140/160]
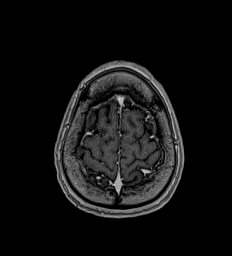
[im 160/160]
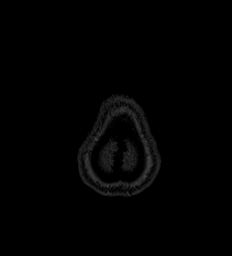

[Series 19: T1 post-contrast · coronal · 5.0mm · 0.57mm/px · 2 of 28 slices shown (2 of 3)]
[im 1/28]
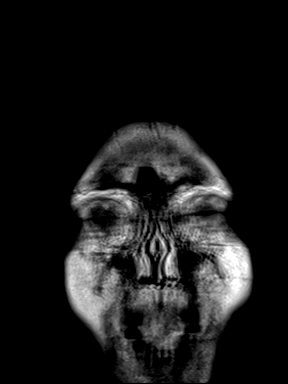
[im 28/28]
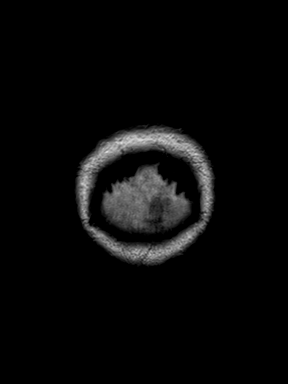

[Series 20: T1 post-contrast · sagittal · 5.0mm · 0.62mm/px · 1 of 22 slices shown (3 of 3)]
[im 1/22]
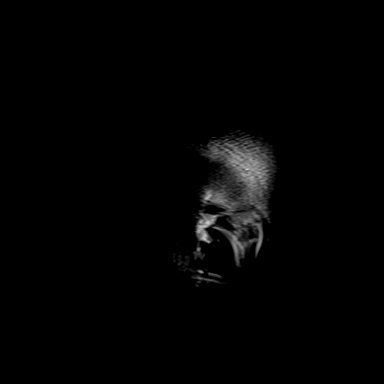

[48 of 48 positions shown; findings below may reference images not displayed]

FINDINGS: Brain: No acute infarct, mass effect or extra-axial collection. No
acute or chronic hemorrhage. Hyperintense T2-weighted signal is
moderately widespread throughout the white matter. Generalized
volume loss without a clear lobar predilection. There is anterior
left temporal lobe encephalomalacia of, which is unchanged. There is
a small amount of nodular contrast enhancement at the left temporal
resection site (series 18, image 63, measuring 3 mm. This is better
visualized on today's study, possibly due to the degree of motion on
the comparison examination. There are new areas of abnormal contrast
enhancement bilaterally within the corona radiata, right greater
than left (series 18 images 110-117). The midline structures are
normal.

Vascular: Major flow voids are preserved.

Skull and upper cervical spine: Remote small left craniectomy.

Sinuses/Orbits:No paranasal sinus fluid levels or advanced mucosal
thickening. No mastoid or middle ear effusion. Normal orbits.
IMPRESSION: 1. New areas of abnormal contrast enhancement bilaterally within the
corona radiata, right greater than left, concerning for new areas of
CNS lymphoma.
2. Small amount of nodular contrast enhancement at the left temporal
resection site, more conspicuous on the current examination,
possibly indicating recurrent disease. It is worth noting that there
was a great deal of motion on the prior study, limiting comparison.
3. Unchanged postsurgical encephalomalacia of the anterior left
temporal lobe.

## 2021-12-21 MED ORDER — GADOBUTROL 1 MMOL/ML IV SOLN
6.0000 mL | Freq: Once | INTRAVENOUS | Status: AC | PRN
  Administered 2021-12-21: 11:00:00 6 mL via INTRAVENOUS

## 2021-12-22 ENCOUNTER — Other Ambulatory Visit: Payer: Self-pay | Admitting: Internal Medicine

## 2021-12-22 ENCOUNTER — Other Ambulatory Visit (HOSPITAL_COMMUNITY): Payer: Self-pay

## 2021-12-22 DIAGNOSIS — C8589 Other specified types of non-Hodgkin lymphoma, extranodal and solid organ sites: Secondary | ICD-10-CM

## 2021-12-25 ENCOUNTER — Other Ambulatory Visit (HOSPITAL_COMMUNITY): Payer: Self-pay

## 2021-12-29 ENCOUNTER — Encounter (INDEPENDENT_AMBULATORY_CARE_PROVIDER_SITE_OTHER): Payer: Self-pay

## 2021-12-29 ENCOUNTER — Other Ambulatory Visit: Payer: Self-pay

## 2021-12-29 ENCOUNTER — Other Ambulatory Visit: Payer: Self-pay | Admitting: Emergency Medicine

## 2021-12-29 ENCOUNTER — Inpatient Hospital Stay (HOSPITAL_BASED_OUTPATIENT_CLINIC_OR_DEPARTMENT_OTHER): Payer: Medicare HMO | Admitting: Internal Medicine

## 2021-12-29 ENCOUNTER — Inpatient Hospital Stay: Payer: Medicare HMO | Attending: Internal Medicine

## 2021-12-29 ENCOUNTER — Other Ambulatory Visit (HOSPITAL_COMMUNITY): Payer: Self-pay

## 2021-12-29 DIAGNOSIS — C8519 Unspecified B-cell lymphoma, extranodal and solid organ sites: Secondary | ICD-10-CM | POA: Insufficient documentation

## 2021-12-29 DIAGNOSIS — C8589 Other specified types of non-Hodgkin lymphoma, extranodal and solid organ sites: Secondary | ICD-10-CM

## 2021-12-29 DIAGNOSIS — E871 Hypo-osmolality and hyponatremia: Secondary | ICD-10-CM | POA: Insufficient documentation

## 2021-12-29 LAB — COMPREHENSIVE METABOLIC PANEL
ALT: 22 U/L (ref 0–44)
AST: 27 U/L (ref 15–41)
Albumin: 4 g/dL (ref 3.5–5.0)
Alkaline Phosphatase: 51 U/L (ref 38–126)
Anion gap: 8 (ref 5–15)
BUN: <5 mg/dL — ABNORMAL LOW (ref 8–23)
CO2: 25 mmol/L (ref 22–32)
Calcium: 9 mg/dL (ref 8.9–10.3)
Chloride: 102 mmol/L (ref 98–111)
Creatinine, Ser: 0.56 mg/dL (ref 0.44–1.00)
GFR, Estimated: >60 mL/min (ref >60)
Glucose, Bld: 99 mg/dL (ref 70–99)
Potassium: 3.7 mmol/L (ref 3.5–5.1)
Sodium: 135 mmol/L (ref 135–145)
Total Bilirubin: 0.7 mg/dL (ref 0.3–1.2)
Total Protein: 6.9 g/dL (ref 6.5–8.1)

## 2021-12-29 LAB — CBC WITH DIFFERENTIAL/PLATELET
Abs Immature Granulocytes: 0.21 10*3/uL — ABNORMAL HIGH (ref 0.00–0.07)
Basophils Absolute: 0 10*3/uL (ref 0.0–0.1)
Basophils Relative: 0 %
Eosinophils Absolute: 0 10*3/uL (ref 0.0–0.5)
Eosinophils Relative: 1 %
HCT: 42.2 % (ref 36.0–46.0)
Hemoglobin: 14 g/dL (ref 12.0–15.0)
Immature Granulocytes: 4 %
Lymphocytes Relative: 33 %
Lymphs Abs: 1.7 10*3/uL (ref 0.7–4.0)
MCH: 28.1 pg (ref 26.0–34.0)
MCHC: 33.2 g/dL (ref 30.0–36.0)
MCV: 84.7 fL (ref 80.0–100.0)
Monocytes Absolute: 0.6 10*3/uL (ref 0.1–1.0)
Monocytes Relative: 11 %
Neutro Abs: 2.6 10*3/uL (ref 1.7–7.7)
Neutrophils Relative %: 51 %
Platelets: 199 10*3/uL (ref 150–400)
RBC: 4.98 MIL/uL (ref 3.87–5.11)
RDW: 13.2 % (ref 11.5–15.5)
WBC: 5.2 10*3/uL (ref 4.0–10.5)
nRBC: 0 % (ref 0.0–0.2)

## 2021-12-29 MED ORDER — AMLODIPINE BESYLATE 2.5 MG PO TABS: 5.0000 mg | ORAL_TABLET | Freq: Every day | ORAL | 0 refills | Status: DC

## 2021-12-29 MED ORDER — SODIUM CHLORIDE 0.9% FLUSH
10.0000 mL | Freq: Once | INTRAVENOUS | Status: AC
  Administered 2021-12-29: 10 mL via INTRAVENOUS
  Filled 2021-12-29: qty 10

## 2021-12-29 MED ORDER — ONDANSETRON HCL 8 MG PO TABS: 8.0000 mg | ORAL_TABLET | Freq: Two times a day (BID) | ORAL | 1 refills | Status: AC | PRN

## 2021-12-29 MED ORDER — HEPARIN SOD (PORK) LOCK FLUSH 100 UNIT/ML IV SOLN
500.0000 [IU] | Freq: Once | INTRAVENOUS | Status: AC
  Administered 2021-12-29: 500 [IU] via INTRAVENOUS
  Filled 2021-12-29: qty 5

## 2021-12-29 NOTE — Progress Notes (Signed)
[facility] [place], [place] [address] [phone]   Interval Evaluation  Date of Service: 12/29/21 Patient Name: Audrey Barron Patient MRN: [id number] Patient DOB: 07/01/1951 Provider: Ventura Sellers, MD  Identifying Statement:  Audrey Barron is a 71 y.o. female with  left hemispheric   Primary CNS Lymphoma    Oncologic History: Oncology History  Primary CNS lymphoma ([place])  04/28/2021 Surgery   Open biopsy with Dr. 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  Patient will transition to q2 week Rituximab in interim    07/21/2021 - 08/18/2021 Chemotherapy   Rituximab monotherapy   08/22/2021 -  Chemotherapy   Initiates first cycle of monthly 5-day Temozolomide, co-administered with Rituxmab q2 weeks.      Biomarkers:  CD20 positive .  Bcl-6 positive  Ki-67 95%      Interval History:  Audrey Barron presents today for follow up, now having completed cycle #4 of 5-day Temodar.  Had good holiday with family, no new or progressive deficits.  Continues on fluid restriction and salt tab.  She continues to walk on her own throughout the home.  For ADLs, she is independent with feeding and toileting but requires assistance for all other activities.  Denies seizures or headaches.  H+P (05/05/21) Patient presented to medical attention in April, with 2-3 months history of confusion, impaired speech, balance and gait impairment.  Neurologist ordered an MRI, which demonstrated infiltrative tumor within much of left hemisphere.  She underwent craniotomy and open biopsy on 04/28/21 with Dr. Izora Ribas at [facility], which yielded a B-cell CNS Lymphoma.  Following surgery,  she has continued to experience difficulty speaking, weakness on the right side, and poor balance.  She is unable to walk without assistance at home.  At present she is reliant on her daughter for help with dressing, cooking, tolieting; a majority of activities of daily living.  She has been dosing decadron 50m three times per day; this has not clearly led to any improvements in functional status.   Medications: Current Outpatient Medications on File Prior to Visit  Medication Sig Dispense Refill   acetaminophen (TYLENOL) 500 MG tablet Take 500 mg by mouth at bedtime.     amLODipine (NORVASC) 2.5 MG tablet TAKE 1 TABLET BY MOUTH ONCE DAILY. 30 tablet 0   cyanocobalamin 1000 MCG tablet Take 1,000 mcg by mouth daily.     diphenhydrAMINE (BENADRYL) 50 MG capsule Take 1 capsule (50 mg total) by mouth at bedtime as needed for sleep. 30 capsule 0   pantoprazole (PROTONIX) 40 MG tablet TAKE ONE TABLET BY MOUTH AT BEDTIME. 30 tablet 11   sodium chloride 1 g tablet Take 1 tablet (1 g total) by mouth 2 (two) times daily with a meal. 60 tablet 3   temozolomide (TEMODAR) 100 MG capsule Take 1 capsule (100 mg total) by mouth daily. May take on an empty stomach to decrease nausea & vomiting. 5 capsule 0   temozolomide (TEMODAR) 140 MG capsule Take 1 capsule (140 mg total) by mouth daily. May take on an empty stomach to decrease nausea & vomiting. 5 capsule 0   Current Facility-Administered Medications on File Prior to Visit  Medication Dose Route Frequency Provider Last Rate Last Admin  heparin lock flush 100 unit/mL  500 Units Intravenous Once Ventura Sellers, MD       sodium chloride flush (NS) 0.9 % injection 10 mL  10 mL Intravenous PRN Ventura Sellers, MD   10 mL at 09/29/21 0909    Allergies: No Known Allergies Past Medical History:  Past Medical History:  Diagnosis Date   Cancer ([place])    lymphoma   Dementia ([place])    Past Surgical History:  Past Surgical History:  Procedure Laterality Date    APPLICATION OF CRANIAL NAVIGATION N/A 04/28/2021   Procedure: APPLICATION OF CRANIAL NAVIGATION;  Surgeon: Meade Maw, MD;  Location: [facility] ORS;  Service: Neurosurgery;  Laterality: N/A;   CRANIOTOMY Left 04/28/2021   Procedure: CRANIOTOMY TUMOR EXCISION;  Surgeon: Meade Maw, MD;  Location: [facility] ORS;  Service: Neurosurgery;  Laterality: Left;   IR IMAGING GUIDED PORT INSERTION  05/12/2021   Social History:  Social History   Socioeconomic History   Marital status: Single    Spouse name: Not on file   Number of children: Not on file   Years of education: Not on file   Highest education level: Not on file  Occupational History   Not on file  Tobacco Use   Smoking status: Never   Smokeless tobacco: Never  Substance and Sexual Activity   Alcohol use: Not Currently   Drug use: Not Currently   Sexual activity: Not on file  Other Topics Concern   Not on file  Social History Narrative   Not on file   Social Determinants of Health   Financial Resource Strain: Not on file  Food Insecurity: Not on file  Transportation Needs: Not on file  Physical Activity: Not on file  Stress: Not on file  Social Connections: Not on file  Intimate Partner Violence: Not on file   Family History: No family history on file.  Review of Systems: Constitutional: Doesn't report fevers, chills or abnormal weight loss Eyes: Doesn't report blurriness of vision Ears, nose, mouth, throat, and face: Doesn't report sore throat Respiratory: Doesn't report cough, dyspnea or wheezes Cardiovascular: Doesn't report palpitation, chest discomfort  Gastrointestinal: +constipation GU: Doesn't report incontinence Skin: Doesn't report skin rashes Neurological: Per HPI Musculoskeletal: Doesn't report joint pain Behavioral/Psych: Doesn't report anxiety  Physical Exam: Wt Readings from Last 3 Encounters:  11/24/21 142 lb (64.4 kg)  10/13/21 143 lb 8 oz (65.1 kg)  09/29/21 144 lb 3.2 oz (65.4 kg)    Temp Readings from Last 3 Encounters:  11/24/21 (!) 97.1 F (36.2 C) (Tympanic)  10/13/21 (!) 96.2 F (35.7 C)  09/29/21 (!) 97 F (36.1 C) (Tympanic)   BP Readings from Last 3 Encounters:  11/24/21 (!) 132/100  10/13/21 (!) 125/92  09/29/21 (!) 89/65   Pulse Readings from Last 3 Encounters:  11/24/21 100  10/13/21 100  09/29/21 88   KPS: 60. General: Alert, cooperative, pleasant, in no acute distress Head: Normal EENT: No conjunctival injection or scleral icterus.  Lungs: Resp effort normal Cardiac: Regular rate Abdomen: Non-distended abdomen Skin: No rashes cyanosis or petechiae. Extremities: No clubbing or edema  Neurologic Exam: Mental Status: Awake, alert, attentive to examiner. Oriented to self and environment. Language is mildly impaired with regards to both fluency and comprehension.  Further mental status evaluation limited by dysphasia. Cranial Nerves: Visual acuity is grossly normal. Visual fields are full. Extra-ocular movements intact. No ptosis. Face is symmetric Motor: Tone and bulk are normal. Power is 4+/5 in right arm and leg.  Reflexes are symmetric, no pathologic reflexes present.  Sensory: Intact to light touch Gait: Dystaxic   Labs: I have reviewed the data as listed    Component Value Date/Time   NA 135 12/29/2021 1000   K 3.7 12/29/2021 1000   CL 102 12/29/2021 1000   CO2 25 12/29/2021 1000   GLUCOSE 99 12/29/2021 1000   BUN <5 (L) 12/29/2021 1000   CREATININE 0.56 12/29/2021 1000   CALCIUM 9.0 12/29/2021 1000   PROT 6.9 12/29/2021 1000   ALBUMIN 4.0 12/29/2021 1000   AST 27 12/29/2021 1000   ALT 22 12/29/2021 1000   ALKPHOS 51 12/29/2021 1000   BILITOT 0.7 12/29/2021 1000   GFRNONAA >60 12/29/2021 1000   Lab Results  Component Value Date   WBC 5.2 12/29/2021   NEUTROABS 2.6 12/29/2021   HGB 14.0 12/29/2021   HCT 42.2 12/29/2021   MCV 84.7 12/29/2021   PLT 199 12/29/2021   Imaging:  [facility] Clinician Interpretation: I have  personally reviewed the CNS images as listed.  My interpretation, in the context of the patient's clinical presentation, is progressive disease  MR BRAIN W WO CONTRAST  Result Date: 12/21/2021 CLINICAL DATA:  B-cell lymphoma. Brain/CNS neoplasm, assess treatment response. EXAM: MRI HEAD WITHOUT AND WITH CONTRAST TECHNIQUE: Multiplanar, multiecho pulse sequences of the brain and surrounding structures were obtained without and with intravenous contrast. CONTRAST:  23m GADAVIST GADOBUTROL 1 MMOL/ML IV SOLN COMPARISON:  10/10/2021 FINDINGS: Brain: No acute infarct, mass effect or extra-axial collection. No acute or chronic hemorrhage. Hyperintense T2-weighted signal is moderately widespread throughout the white matter. Generalized volume loss without a clear lobar predilection. There is anterior left temporal lobe encephalomalacia of, which is unchanged. There is a small amount of nodular contrast enhancement at the left temporal resection site (series 18, image 63, measuring 3 mm. This is better visualized on today's study, possibly due to the degree of motion on the comparison examination. There are new areas of abnormal contrast enhancement bilaterally within the corona radiata, right greater than left (series 18 images 110-117). The midline structures are normal. Vascular: Major flow voids are preserved. Skull and upper cervical spine: Remote small left craniectomy. Sinuses/Orbits:No paranasal sinus fluid levels or advanced mucosal thickening. No mastoid or middle ear effusion. Normal orbits. IMPRESSION: 1. New areas of abnormal contrast enhancement bilaterally within the corona radiata, right greater than left, concerning for new areas of CNS lymphoma. 2. Small amount of nodular contrast enhancement at the left temporal resection site, more conspicuous on the current examination, possibly indicating recurrent disease. It is worth noting that there was a great deal of motion on the prior study, limiting  comparison. 3. 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 Electronically Signed   By: KUlyses JarredM.D.   On: 12/21/2021 19:37     Assessment/Plan Primary CNS [place] ([place] - Plan: ondansetron (ZOFRAN) 8 MG tablet  BJunita Pushis clinically stable today, now having completed cycle #4 of Temozolomide.  MRI brain unfortunately demonstrates pattern consistent with progressive disease, with multiple regions of novel contrast enhancement.  We provided options moving forward for next line therapy, including oral Ibrutinib and whole brain radiation.  We reviewed side effects of Ibrutinib, including cardiac dysfunction, anemia, hypertension, opportunistic infection.  She understands radiation will lead to further cognitive impairment.  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  She also is not interested in intensive interventions.  We also discussed goals of  care more broadly, including consideration of palliative/hospice.  Given aggressive nature of CNS lymphoma and lack of curative interventions, she did express interest in focusing more on quality of life.   We will arrange for more comprehensive goals of care discussion with palliative care, Dr. Regenia Skeeter.  They are agreeable with this.  Junita Push and her daughter will reach out to [place] in the next week or so when they have made a decision on next step, or preferred treatment plan.  We ask that Jackeline Gutknecht return to clinic pending goals of care discussion.  All questions were answered. The patient knows to call the clinic with any problems, questions or concerns. No barriers to learning were detected.  The total time spent in the encounter was 40 minutes and more than 50% was on counseling and review of test results   Ventura Sellers, MD Medical Director of Neuro-Oncology [facility] [place] 12/29/21 10:45 AM

## 2021-12-29 NOTE — Progress Notes (Signed)
BP at home 170s/110s-130s. Home health nurse adivsed family to double up on norvasc but they wanted to double check with dr first. Bp 138/107 today. Reported swelling in both feet. Denies CP/SOB. Pt reports nausea and requesting zofran refill.

## 2022-01-01 ENCOUNTER — Other Ambulatory Visit (HOSPITAL_COMMUNITY): Payer: Self-pay

## 2022-01-01 ENCOUNTER — Inpatient Hospital Stay: Payer: Medicare HMO | Attending: Internal Medicine

## 2022-01-02 ENCOUNTER — Other Ambulatory Visit: Payer: Self-pay | Admitting: Internal Medicine

## 2022-01-04 ENCOUNTER — Inpatient Hospital Stay (HOSPITAL_BASED_OUTPATIENT_CLINIC_OR_DEPARTMENT_OTHER): Payer: Medicare HMO | Admitting: Hospice and Palliative Medicine

## 2022-01-04 ENCOUNTER — Other Ambulatory Visit: Payer: Self-pay

## 2022-01-04 VITALS — BP 129/97 | HR 100 | Temp 97.8°F | Resp 16

## 2022-01-04 DIAGNOSIS — Z515 Encounter for palliative care: Secondary | ICD-10-CM

## 2022-01-04 DIAGNOSIS — C8589 Other specified types of non-Hodgkin lymphoma, extranodal and solid organ sites: Secondary | ICD-10-CM | POA: Diagnosis not present

## 2022-01-04 DIAGNOSIS — C8519 Unspecified B-cell lymphoma, extranodal and solid organ sites: Secondary | ICD-10-CM | POA: Diagnosis not present

## 2022-01-04 NOTE — Progress Notes (Signed)
[place] at [facility] [phone] [phone]   Name: Audrey Barron Date: 01/04/2022 MRN: [id number]  DOB: February 02, 1951  Patient Care Team: Marguerita Merles, MD as PCP - General (Family Medicine) Murlean Iba, MD (Nephrology) Tamala Julian Jonette Eva, NP as Nurse Practitioner (Hospice and Palliative Medicine) Erven Colla (Physician Assistant) Ventura Sellers, MD as Consulting Physician (Psychiatry)    REASON FOR CONSULTATION: Audrey Barron is a 71 y.o. 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  Most recently, patient has completed 4 cycles of Temodar.  MRI of the brain on 12/21/2021 revealed new areas of contrast-enhancement bilaterally concerning for progression of CNS lymphoma.  Patient saw Dr. Mickeal Skinner with discussion of Therapy (ibrutinib and whole brain radiation) versus transitioning to best supportive care/hospice.  Palliative care was consulted help address goals.  SOCIAL HISTORY:     reports that she has never smoked. She has never used smokeless tobacco. She reports that she does not currently use alcohol. She reports that she does not currently use drugs.  Patient was never married.  She has one daughter with whom she lives.  ADVANCE DIRECTIVES:  Not on file  CODE STATUS: DNR/DNI (MOST form completed on 01/04/2022)  PAST MEDICAL HISTORY: Past Medical History:  Diagnosis Date   Cancer ([place])    lymphoma   Dementia ([place])     PAST SURGICAL HISTORY:  Past Surgical History:  Procedure Laterality Date   APPLICATION OF CRANIAL NAVIGATION N/A 04/28/2021   Procedure: APPLICATION OF CRANIAL NAVIGATION;  Surgeon: Meade Maw,  MD;  Location: [facility] ORS;  Service: Neurosurgery;  Laterality: N/A;   CRANIOTOMY Left 04/28/2021   Procedure: CRANIOTOMY TUMOR EXCISION;  Surgeon: Meade Maw, MD;  Location: [facility] ORS;  Service: Neurosurgery;  Laterality: Left;   IR IMAGING GUIDED PORT INSERTION  05/12/2021    HEMATOLOGY/ONCOLOGY HISTORY:  Oncology History  Primary CNS lymphoma ([place])  04/28/2021 Surgery   Open biopsy with Dr. Izora Ribas; path demonstrates CNS B-cell lymphoma, CD20+   05/30/2021 - 06/02/2021 Chemotherapy   High dose Methotrexate and Rituximab at [facility]; admission complicated by hyponatremia, SIADH   06/21/2021 -  Chemotherapy   Rituximab administered, Methotrexate withheld because of hyponatremia 2/2 SIADH.  Patient will transition to q2 week Rituximab in interim    07/21/2021 - 08/18/2021 Chemotherapy   Rituximab monotherapy   08/22/2021 -  Chemotherapy   Initiates first cycle of monthly 5-day Temozolomide, co-administered with Rituxmab q2 weeks.      ALLERGIES:  has No Known Allergies.  MEDICATIONS:  Current Outpatient Medications  Medication Sig Dispense Refill   acetaminophen (TYLENOL) 500 MG tablet Take 500 mg by mouth at bedtime.     amLODipine (NORVASC) 2.5 MG tablet TAKE 1 TABLET BY MOUTH ONCE DAILY. 30 tablet 0   cyanocobalamin 1000 MCG tablet Take 1,000 mcg by mouth daily.     diphenhydrAMINE (BENADRYL) 50 MG capsule Take 1 capsule (50 mg total) by mouth at bedtime as needed for sleep. 30 capsule 0   ondansetron (ZOFRAN) 8 MG tablet Take 1 tablet (8 mg total) by mouth 2 (two) times daily as needed (nausea and vomiting). May take 30-60 minutes prior to Temodar administration if nausea/vomiting occurs. 30 tablet 1   pantoprazole (PROTONIX) 40 MG tablet TAKE ONE TABLET  BY MOUTH AT BEDTIME. 30 tablet 11   sodium chloride 1 g tablet TAKE (2) TABLETS THREE TIMES DAILY WITH FOOD. 180 tablet 0   temozolomide (TEMODAR) 100 MG capsule Take 1 capsule (100 mg total) by mouth daily. May take on an  empty stomach to decrease nausea & vomiting. 5 capsule 0   temozolomide (TEMODAR) 140 MG capsule Take 1 capsule (140 mg total) by mouth daily. May take on an empty stomach to decrease nausea & vomiting. 5 capsule 0   No current facility-administered medications for this visit.   Facility-Administered Medications Ordered in Other Visits  Medication Dose Route Frequency Provider Last Rate Last Admin   heparin lock flush 100 unit/mL  500 Units Intravenous Once Vaslow, Acey Lav, MD       sodium chloride flush (NS) 0.9 % injection 10 mL  10 mL Intravenous PRN Ventura Sellers, MD   10 mL at 09/29/21 0909    VITAL SIGNS: There were no vitals taken for this visit. There were no vitals filed for this visit.  Estimated body mass index is 23.63 kg/m as calculated from the following:   Height as of 06/22/21: _0  (1.651 m).   Weight as of 11/24/21: 142 lb (64.4 kg).  LABS: CBC:    Component Value Date/Time   WBC 5.2 12/29/2021 1000   HGB 14.0 12/29/2021 1000   HCT 42.2 12/29/2021 1000   PLT 199 12/29/2021 1000   MCV 84.7 12/29/2021 1000   NEUTROABS 2.6 12/29/2021 1000   LYMPHSABS 1.7 12/29/2021 1000   MONOABS 0.6 12/29/2021 1000   EOSABS 0.0 12/29/2021 1000   BASOSABS 0.0 12/29/2021 1000   Comprehensive Metabolic Panel:    Component Value Date/Time   NA 135 12/29/2021 1000   K 3.7 12/29/2021 1000   CL 102 12/29/2021 1000   CO2 25 12/29/2021 1000   BUN <5 (L) 12/29/2021 1000   CREATININE 0.56 12/29/2021 1000   GLUCOSE 99 12/29/2021 1000   CALCIUM 9.0 12/29/2021 1000   AST 27 12/29/2021 1000   ALT 22 12/29/2021 1000   ALKPHOS 51 12/29/2021 1000   BILITOT 0.7 12/29/2021 1000   PROT 6.9 12/29/2021 1000   ALBUMIN 4.0 12/29/2021 1000    RADIOGRAPHIC STUDIES: MR BRAIN W WO CONTRAST  Result Date: 12/21/2021 CLINICAL DATA:  B-cell lymphoma. Brain/CNS neoplasm, assess treatment response. EXAM: MRI HEAD WITHOUT AND WITH CONTRAST TECHNIQUE: Multiplanar, multiecho pulse sequences of  the brain and surrounding structures were obtained without and with intravenous contrast. CONTRAST:  21m GADAVIST GADOBUTROL 1 MMOL/ML IV SOLN COMPARISON:  10/10/2021 FINDINGS: Brain: No acute infarct, mass effect or extra-axial collection. No acute or chronic hemorrhage. Hyperintense T2-weighted signal is moderately widespread throughout the white matter. Generalized volume loss without a clear lobar predilection. There is anterior left temporal lobe encephalomalacia of, which is unchanged. There is a small amount of nodular contrast enhancement at the left temporal resection site (series 18, image 63, measuring 3 mm. This is better visualized on today's study, possibly due to the degree of motion on the comparison examination. There are new areas of abnormal contrast enhancement bilaterally within the corona radiata, right greater than left (series 18 images 110-117). The midline structures are normal. Vascular: Major flow voids are preserved. Skull and upper cervical spine: Remote small left craniectomy. Sinuses/Orbits:No paranasal sinus fluid levels or advanced mucosal thickening. No mastoid or middle ear effusion. Normal orbits. IMPRESSION: 1. New areas of abnormal contrast enhancement bilaterally within the corona radiata, right greater than left,  concerning for new areas of CNS lymphoma. 2. Small amount of nodular contrast enhancement at the left temporal resection site, more conspicuous on the current examination, possibly indicating recurrent disease. It is worth noting that there was a great deal of motion on the prior study, limiting comparison. 3. 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 Electronically Signed   By: Ulyses Jarred M.D.   On: 12/21/2021 19:37    PERFORMANCE STATUS (ECOG) : 2 - Symptomatic, <50% confined to bed  Review of Systems Unless otherwise noted, a complete review of systems is negative.  Physical Exam General: NAD Pulmonary:  Unlabored Extremities: no edema, no joint deformities Skin: no rashes Neurological: Weakness but otherwise nonfocal  IMPRESSION: I met with patient and daughter today in the clinic.  I introduced palliative care services and attempted to establish therapeutic rapport.  Patient and daughter are somewhat familiar with palliative care as they have previously been seen in the home by Ralene Bathe, NP.   Both patient and daughter verbalized understanding of disease progression.  They also seem to have a reasonable understanding of future treatment options and possible adverse effects associated with those.  We had a lengthy discussion regarding goals.  Ultimately, patient recognizes that her cancer is incurable and she verbalized a desire to focus on quality of life and comfort at home and wishes to forego future treatment.  Patient is familiar with the concept of hospice as they cared for her mother at end-of-life.  Both patient and daughter verbalized agreement with hospice involvement with a focus on best supportive care at home.  Patient feels like she has strong support from family and is doing reasonably well at home.  She denied significant symptomatic concerns today.  I sent patient home with ACP documents.  Patient has previously completed a Kelley (daughter) but does not have a healthcare power of attorney.  Patient stated that she would not want to be resuscitated or have her life prolonged artificially on machines.  Her daughter agreed with DNR/DNI.  Patient is not interested in hospitalization and again wishes to focus on comfort at home until her end-of-life.  I completed a MOST form today. The patient and family outlined their wishes for the following treatment decisions:  Cardiopulmonary Resuscitation: Do Not Attempt Resuscitation (DNR/No CPR)  Medical Interventions: Comfort Measures: Keep clean, warm, and dry. Use medication by any route, positioning, wound care,  and other measures to relieve pain and suffering. Use oxygen, suction and manual treatment of airway obstruction as needed for comfort. Do not transfer to the hospital unless comfort needs cannot be met in current location.  Antibiotics: No antibiotics (use other measures to relieve symptoms)  IV Fluids: No IV fluids (provide other measures to ensure comfort)  Feeding Tube: No feeding tube     PLAN: -Best supportive care/comfort measures -Referral for home hospice -DNR/DNI -MOST Form completed -RTC as needed  Case and plan discussed with Dr. Mickeal Skinner  Patient expressed understanding and was in agreement with this plan. She also understands that She can call the clinic at any time with any questions, concerns, or complaints.    Time Total: 45 minutes  Visit consisted of counseling and education dealing with the complex and emotionally intense issues of symptom management and palliative care in the setting of serious and potentially life-threatening illness.Greater than 50%  of this time was spent counseling and coordinating care related to the above assessment and plan.  Signed by: Altha Harm, PhD,  NP-C

## 2022-01-09 ENCOUNTER — Telehealth: Payer: Self-pay | Admitting: Hospice and Palliative Medicine

## 2022-01-09 NOTE — Telephone Encounter (Signed)
I received a call from Dr. Konrad Dolores with hospice.  Patient was seen by hospice nurse over the weekend but not admitted to services.  Apparently, there was some question about patient's Medicare and Medicaid benefits.  I called and spoke with daughter, Anderson Malta, who confirmed that she is still interested in having hospice services involved.  I spoke again with Dr. Konrad Dolores who will coordinate care.

## 2022-01-18 ENCOUNTER — Other Ambulatory Visit: Payer: Medicare HMO | Admitting: Primary Care

## 2022-01-18 ENCOUNTER — Other Ambulatory Visit: Payer: Self-pay

## 2022-01-18 DIAGNOSIS — R262 Difficulty in walking, not elsewhere classified: Secondary | ICD-10-CM

## 2022-01-18 DIAGNOSIS — Z515 Encounter for palliative care: Secondary | ICD-10-CM

## 2022-01-18 DIAGNOSIS — C8589 Other specified types of non-Hodgkin lymphoma, extranodal and solid organ sites: Secondary | ICD-10-CM

## 2022-01-18 DIAGNOSIS — R413 Other amnesia: Secondary | ICD-10-CM

## 2022-01-18 DIAGNOSIS — R200 Anesthesia of skin: Secondary | ICD-10-CM

## 2022-01-18 DIAGNOSIS — C8339 Primary central nervous system lymphoma: Secondary | ICD-10-CM

## 2022-01-18 DIAGNOSIS — R296 Repeated falls: Secondary | ICD-10-CM

## 2022-01-18 NOTE — Progress Notes (Signed)
[facility] Consult Note Telephone: [phone]  Fax: [phone]    Date of encounter: 01/18/22 PATIENT NAME: Audrey Barron [address] [place] [address]   [id number] (home)  DOB: July 17, 1951 MRN: [id number] PRIMARY CARE PROVIDER:    Marguerita Merles, MD,  [place] [address] [phone]  REFERRING PROVIDER:   Marguerita Merles, [place],  [place] [address] [phone]  RESPONSIBLE PARTY:    Contact Information     Name Relation Home Work Mobile   [place] Daughter [phone]  [phone]   Devri, Kreher [phone]  [phone]   Monte, Bronder Sister [phone]  [phone]        I met face to face with family in  home. Palliative Care was asked to follow this patient by consultation request of  Marguerita Merles, MD to address advance care planning.                                   ASSESSMENT AND PLAN / RECOMMENDATIONS:   Advance Care Planning/Goals of Care: Goals include to maximize quality of life and symptom management. Patient/health care surrogate gave his/her permission to discuss.Our advance care planning conversation included a discussion about:    The value and importance of advance care planning  Exploration of personal, cultural or spiritual beliefs that might influence medical decisions  Exploration of goals of care in the event of a sudden injury or illness  Review of an  advance directive document . Decision  to de-escalate disease focused treatments due to poor prognosis. CODE STATUS: DNR  I  reviewed a MOST form today. The patient and family outlined their wishes for the following treatment decisions:  Cardiopulmonary Resuscitation: Do Not Attempt Resuscitation (DNR/No CPR)  Medical Interventions: Comfort Measures: Keep clean, warm, and dry. Use medication by any route, positioning, wound care, and other measures  to relieve pain and suffering. Use oxygen, suction and manual treatment of airway obstruction as needed for comfort. Do not transfer to the hospital unless comfort needs cannot be met in current location.  Antibiotics: No antibiotics (use other measures to relieve symptoms)  IV Fluids: No IV fluids (provide other measures to ensure comfort)  Feeding Tube: No feeding tube    I met with patient's family for advance care planning meeting. Patient's diagnosis was recently given. Her chemotherapeutic had been ineffective and all treatment has been stopped. Hospice was recommended but she had some questions regarding the hospice program. Today I clarified the hospice program payment being under the part A Medicare benefit. This will not interfere with her personal care services under Medicaid.   She felt it might be too early. We discussed that she could begin with minimal services and increase these as the patient declined. Currently the patient is enjoying some degree of function and would like to have the option for car rides, etc. Patient also does not fully grasp the gravity per her daughter and she wishes to not focus on this but allowing her mother to have quality time without worrying about this  terminal illness.   We discussed hospice as being a support for physical symptoms as well as psychosocial and spiritual. We discussed a 24 hour on-call which would allow her to get in touch with someone for assessment if needed. Daughter reinforces that she does not see a reason to return to the  hospital or to specialty visits. She in fact just canceled a Neuro follow up for memory loss. She states she would like her mother to keep in touch with the primary care provider; I reiterated PCP is part of the hospice treatment  team. She ended our discussion by being requesting hospice services be initiated for her mothers care.  Follow up Palliative Care Visit: Refer to hospice   I spent 50 minutes providing this  consultation. More than 50% of the time in this consultation was spent in counseling and care coordination.  Thank you for the opportunity to participate in the care of Ms. Risko.  The palliative care team will continue to follow. Please call our office at [phone] if we can be of additional assistance.   Jason Coop, NP , DNP, MPH, [facility]  COVID-19 PATIENT SCREENING TOOL Asked and negative response unless otherwise noted:   Have you had symptoms of covid, tested positive or been in contact with someone with symptoms/positive test in the past 5-10 days?

## 2022-01-22 ENCOUNTER — Other Ambulatory Visit: Payer: Self-pay | Admitting: Internal Medicine

## 2022-01-23 ENCOUNTER — Encounter: Payer: Self-pay | Admitting: Internal Medicine

## 2022-01-25 ENCOUNTER — Inpatient Hospital Stay: Payer: Medicare HMO | Attending: Hospice and Palliative Medicine | Admitting: Hospice and Palliative Medicine

## 2022-01-25 DIAGNOSIS — Z515 Encounter for palliative care: Secondary | ICD-10-CM

## 2022-01-25 NOTE — Progress Notes (Signed)
Spoke with patient's daughter.  Patient is reportedly doing well.  Hospice is now following at home.  Daughter plans to speak with hospice about completing [facility] POA documents.  I encouraged daughter to call or have hospice notify us with any needs.

## 2022-02-15 ENCOUNTER — Other Ambulatory Visit (HOSPITAL_COMMUNITY): Payer: Self-pay

## 2022-03-05 ENCOUNTER — Other Ambulatory Visit (HOSPITAL_COMMUNITY): Payer: Self-pay

## 2022-03-19 ENCOUNTER — Telehealth: Payer: Self-pay | Admitting: *Deleted

## 2022-03-19 NOTE — Telephone Encounter (Signed)
Received call from [facility]. ? ?Reports patient has started having headaches, nausea/vomiting and she is requesting verbal orders for compazine and also something for constipation like milk of magnesium. ? ?Routing to provider to please review and place orders as appropriate. ?

## 2022-03-19 NOTE — Telephone Encounter (Signed)
Correction to previous note.  Upon calling Jeannie @ [facility] and she stated that this has been going on since the weekend so she instead went ahead and got orders from the hospitalist.  No orders needed. ? ? ?

## 2022-03-20 ENCOUNTER — Other Ambulatory Visit (HOSPITAL_COMMUNITY): Payer: Self-pay

## 2022-03-30 ENCOUNTER — Other Ambulatory Visit (HOSPITAL_COMMUNITY): Payer: Self-pay

## 2022-05-24 DEATH — deceased

## 2022-05-29 ENCOUNTER — Other Ambulatory Visit: Payer: Self-pay | Admitting: Oncology

## 2022-06-15 ENCOUNTER — Telehealth: Payer: Self-pay | Admitting: Hospice and Palliative Medicine
